# Patient Record
Sex: Male | Born: 1950 | Race: Black or African American | Hispanic: No | Marital: Married | Smoking: Never smoker
Health system: Southern US, Community
[De-identification: ages and names within clinical notes are randomized; demographics above are authoritative.]

## PROBLEM LIST (undated history)

## (undated) DIAGNOSIS — J449 Chronic obstructive pulmonary disease, unspecified: Secondary | ICD-10-CM

## (undated) DIAGNOSIS — I951 Orthostatic hypotension: Secondary | ICD-10-CM

## (undated) DIAGNOSIS — E785 Hyperlipidemia, unspecified: Secondary | ICD-10-CM

## (undated) DIAGNOSIS — R51 Headache: Secondary | ICD-10-CM

## (undated) DIAGNOSIS — J189 Pneumonia, unspecified organism: Secondary | ICD-10-CM

## (undated) DIAGNOSIS — R011 Cardiac murmur, unspecified: Secondary | ICD-10-CM

## (undated) DIAGNOSIS — I429 Cardiomyopathy, unspecified: Secondary | ICD-10-CM

## (undated) DIAGNOSIS — J069 Acute upper respiratory infection, unspecified: Secondary | ICD-10-CM

## (undated) DIAGNOSIS — Z9889 Other specified postprocedural states: Secondary | ICD-10-CM

## (undated) DIAGNOSIS — E669 Obesity, unspecified: Secondary | ICD-10-CM

## (undated) DIAGNOSIS — R569 Unspecified convulsions: Secondary | ICD-10-CM

## (undated) DIAGNOSIS — K759 Inflammatory liver disease, unspecified: Secondary | ICD-10-CM

## (undated) DIAGNOSIS — R0602 Shortness of breath: Secondary | ICD-10-CM

## (undated) DIAGNOSIS — G4730 Sleep apnea, unspecified: Secondary | ICD-10-CM

## (undated) DIAGNOSIS — Z981 Arthrodesis status: Secondary | ICD-10-CM

## (undated) DIAGNOSIS — I10 Essential (primary) hypertension: Secondary | ICD-10-CM

## (undated) DIAGNOSIS — K219 Gastro-esophageal reflux disease without esophagitis: Secondary | ICD-10-CM

## (undated) DIAGNOSIS — G459 Transient cerebral ischemic attack, unspecified: Secondary | ICD-10-CM

## (undated) DIAGNOSIS — IMO0002 Reserved for concepts with insufficient information to code with codable children: Secondary | ICD-10-CM

## (undated) HISTORY — DX: Essential (primary) hypertension: I10

## (undated) HISTORY — DX: Hyperlipidemia, unspecified: E78.5

## (undated) HISTORY — DX: Orthostatic hypotension: I95.1

## (undated) HISTORY — DX: Obesity, unspecified: E66.9

## (undated) HISTORY — DX: Reserved for concepts with insufficient information to code with codable children: IMO0002

## (undated) HISTORY — DX: Arthrodesis status: Z98.1

## (undated) HISTORY — DX: Other specified postprocedural states: Z98.890

---

## 1997-07-03 HISTORY — PX: LUMBAR DISC SURGERY: SHX700

## 1997-11-02 ENCOUNTER — Ambulatory Visit (HOSPITAL_COMMUNITY): Admission: RE | Admit: 1997-11-02 | Discharge: 1997-11-02 | Payer: Self-pay | Admitting: *Deleted

## 1997-11-03 ENCOUNTER — Ambulatory Visit (HOSPITAL_COMMUNITY): Admission: RE | Admit: 1997-11-03 | Discharge: 1997-11-03 | Payer: Self-pay | Admitting: *Deleted

## 1997-12-02 ENCOUNTER — Ambulatory Visit (HOSPITAL_BASED_OUTPATIENT_CLINIC_OR_DEPARTMENT_OTHER): Admission: RE | Admit: 1997-12-02 | Discharge: 1997-12-02 | Payer: Self-pay | Admitting: Orthopedic Surgery

## 1998-12-28 ENCOUNTER — Encounter: Payer: Self-pay | Admitting: Emergency Medicine

## 1998-12-28 ENCOUNTER — Emergency Department (HOSPITAL_COMMUNITY): Admission: EM | Admit: 1998-12-28 | Discharge: 1998-12-28 | Payer: Self-pay | Admitting: Emergency Medicine

## 1999-01-11 ENCOUNTER — Encounter: Payer: Self-pay | Admitting: Emergency Medicine

## 1999-01-11 ENCOUNTER — Emergency Department (HOSPITAL_COMMUNITY): Admission: EM | Admit: 1999-01-11 | Discharge: 1999-01-11 | Payer: Self-pay | Admitting: Emergency Medicine

## 1999-10-06 ENCOUNTER — Encounter: Payer: Self-pay | Admitting: Emergency Medicine

## 1999-10-06 ENCOUNTER — Emergency Department (HOSPITAL_COMMUNITY): Admission: EM | Admit: 1999-10-06 | Discharge: 1999-10-06 | Payer: Self-pay | Admitting: Emergency Medicine

## 1999-12-26 ENCOUNTER — Emergency Department (HOSPITAL_COMMUNITY): Admission: EM | Admit: 1999-12-26 | Discharge: 1999-12-26 | Payer: Self-pay | Admitting: Emergency Medicine

## 1999-12-26 ENCOUNTER — Encounter: Payer: Self-pay | Admitting: Emergency Medicine

## 2001-03-13 ENCOUNTER — Ambulatory Visit (HOSPITAL_BASED_OUTPATIENT_CLINIC_OR_DEPARTMENT_OTHER): Admission: RE | Admit: 2001-03-13 | Discharge: 2001-03-13 | Payer: Self-pay | Admitting: Orthopedic Surgery

## 2003-07-28 ENCOUNTER — Emergency Department (HOSPITAL_COMMUNITY): Admission: EM | Admit: 2003-07-28 | Discharge: 2003-07-28 | Payer: Self-pay | Admitting: *Deleted

## 2003-07-29 ENCOUNTER — Observation Stay (HOSPITAL_COMMUNITY): Admission: RE | Admit: 2003-07-29 | Discharge: 2003-07-30 | Payer: Self-pay | Admitting: General Surgery

## 2004-12-01 ENCOUNTER — Ambulatory Visit: Payer: Self-pay | Admitting: Family Medicine

## 2004-12-01 ENCOUNTER — Ambulatory Visit (HOSPITAL_COMMUNITY): Admission: RE | Admit: 2004-12-01 | Discharge: 2004-12-01 | Payer: Self-pay | Admitting: Family Medicine

## 2004-12-07 ENCOUNTER — Ambulatory Visit: Payer: Self-pay | Admitting: Family Medicine

## 2004-12-19 ENCOUNTER — Ambulatory Visit: Payer: Self-pay | Admitting: Family Medicine

## 2004-12-22 HISTORY — PX: OTHER SURGICAL HISTORY: SHX169

## 2005-01-10 ENCOUNTER — Ambulatory Visit (HOSPITAL_COMMUNITY): Admission: RE | Admit: 2005-01-10 | Discharge: 2005-01-10 | Payer: Self-pay | Admitting: Sports Medicine

## 2005-01-19 ENCOUNTER — Ambulatory Visit: Payer: Self-pay | Admitting: Family Medicine

## 2005-01-25 HISTORY — PX: OTHER SURGICAL HISTORY: SHX169

## 2005-02-24 ENCOUNTER — Ambulatory Visit: Payer: Self-pay | Admitting: Family Medicine

## 2005-02-24 ENCOUNTER — Ambulatory Visit (HOSPITAL_COMMUNITY): Admission: RE | Admit: 2005-02-24 | Discharge: 2005-02-24 | Payer: Self-pay | Admitting: Cardiovascular Disease

## 2005-02-27 ENCOUNTER — Ambulatory Visit (HOSPITAL_COMMUNITY): Admission: RE | Admit: 2005-02-27 | Discharge: 2005-02-27 | Payer: Self-pay | Admitting: Cardiovascular Disease

## 2005-04-02 HISTORY — PX: CARDIAC CATHETERIZATION: SHX172

## 2005-05-03 ENCOUNTER — Ambulatory Visit: Payer: Self-pay | Admitting: Family Medicine

## 2005-07-12 ENCOUNTER — Ambulatory Visit: Payer: Self-pay | Admitting: Family Medicine

## 2005-07-21 ENCOUNTER — Ambulatory Visit: Payer: Self-pay | Admitting: Family Medicine

## 2005-07-27 ENCOUNTER — Ambulatory Visit (HOSPITAL_BASED_OUTPATIENT_CLINIC_OR_DEPARTMENT_OTHER): Admission: RE | Admit: 2005-07-27 | Discharge: 2005-07-27 | Payer: Self-pay | Admitting: Family Medicine

## 2005-07-30 ENCOUNTER — Ambulatory Visit: Payer: Self-pay | Admitting: Internal Medicine

## 2005-08-14 HISTORY — PX: OTHER SURGICAL HISTORY: SHX169

## 2005-08-14 HISTORY — PX: CPAP: SHX449

## 2005-08-31 ENCOUNTER — Ambulatory Visit: Payer: Self-pay | Admitting: Family Medicine

## 2005-09-11 ENCOUNTER — Ambulatory Visit (HOSPITAL_COMMUNITY): Admission: RE | Admit: 2005-09-11 | Discharge: 2005-09-11 | Payer: Self-pay | Admitting: Internal Medicine

## 2005-09-15 HISTORY — PX: OTHER SURGICAL HISTORY: SHX169

## 2005-10-18 ENCOUNTER — Ambulatory Visit: Payer: Self-pay | Admitting: Family Medicine

## 2005-10-20 ENCOUNTER — Encounter: Admission: RE | Admit: 2005-10-20 | Discharge: 2005-10-20 | Payer: Self-pay | Admitting: Sports Medicine

## 2005-10-25 HISTORY — PX: MRI: SHX5353

## 2005-10-27 ENCOUNTER — Ambulatory Visit: Payer: Self-pay | Admitting: Family Medicine

## 2005-11-02 ENCOUNTER — Ambulatory Visit: Payer: Self-pay | Admitting: Family Medicine

## 2005-11-09 ENCOUNTER — Encounter: Admission: RE | Admit: 2005-11-09 | Discharge: 2005-11-09 | Payer: Self-pay | Admitting: Neurosurgery

## 2005-11-24 ENCOUNTER — Encounter: Admission: RE | Admit: 2005-11-24 | Discharge: 2005-11-24 | Payer: Self-pay | Admitting: Neurosurgery

## 2006-01-18 ENCOUNTER — Ambulatory Visit: Payer: Self-pay | Admitting: Family Medicine

## 2006-02-06 ENCOUNTER — Ambulatory Visit: Payer: Self-pay | Admitting: Family Medicine

## 2006-02-20 ENCOUNTER — Ambulatory Visit (HOSPITAL_COMMUNITY): Admission: RE | Admit: 2006-02-20 | Discharge: 2006-02-20 | Payer: Self-pay | Admitting: Family Medicine

## 2006-02-20 ENCOUNTER — Ambulatory Visit: Payer: Self-pay | Admitting: Sports Medicine

## 2006-03-06 ENCOUNTER — Ambulatory Visit: Payer: Self-pay | Admitting: Family Medicine

## 2006-03-07 ENCOUNTER — Ambulatory Visit: Payer: Self-pay | Admitting: Family Medicine

## 2006-03-09 ENCOUNTER — Encounter: Admission: RE | Admit: 2006-03-09 | Discharge: 2006-03-09 | Payer: Self-pay | Admitting: Sports Medicine

## 2006-03-15 ENCOUNTER — Ambulatory Visit: Payer: Self-pay | Admitting: Family Medicine

## 2006-03-22 ENCOUNTER — Ambulatory Visit: Payer: Self-pay | Admitting: Family Medicine

## 2006-03-23 ENCOUNTER — Inpatient Hospital Stay (HOSPITAL_COMMUNITY): Admission: RE | Admit: 2006-03-23 | Discharge: 2006-03-28 | Payer: Self-pay | Admitting: Neurosurgery

## 2006-03-23 ENCOUNTER — Ambulatory Visit: Payer: Self-pay | Admitting: Family Medicine

## 2006-03-23 ENCOUNTER — Ambulatory Visit: Payer: Self-pay | Admitting: Pulmonary Disease

## 2006-03-26 ENCOUNTER — Encounter: Payer: Self-pay | Admitting: Vascular Surgery

## 2006-05-16 ENCOUNTER — Ambulatory Visit: Payer: Self-pay | Admitting: Family Medicine

## 2006-08-17 ENCOUNTER — Ambulatory Visit: Payer: Self-pay | Admitting: Family Medicine

## 2006-08-30 DIAGNOSIS — R569 Unspecified convulsions: Secondary | ICD-10-CM | POA: Insufficient documentation

## 2006-08-30 DIAGNOSIS — E7800 Pure hypercholesterolemia, unspecified: Secondary | ICD-10-CM

## 2006-08-30 DIAGNOSIS — B1710 Acute hepatitis C without hepatic coma: Secondary | ICD-10-CM

## 2006-08-30 DIAGNOSIS — I10 Essential (primary) hypertension: Secondary | ICD-10-CM

## 2006-08-30 DIAGNOSIS — J4489 Other specified chronic obstructive pulmonary disease: Secondary | ICD-10-CM | POA: Insufficient documentation

## 2006-08-30 DIAGNOSIS — K219 Gastro-esophageal reflux disease without esophagitis: Secondary | ICD-10-CM | POA: Insufficient documentation

## 2006-08-30 DIAGNOSIS — G4730 Sleep apnea, unspecified: Secondary | ICD-10-CM | POA: Insufficient documentation

## 2006-08-30 DIAGNOSIS — J449 Chronic obstructive pulmonary disease, unspecified: Secondary | ICD-10-CM

## 2006-09-18 ENCOUNTER — Ambulatory Visit: Payer: Self-pay | Admitting: Family Medicine

## 2006-11-30 ENCOUNTER — Telehealth: Payer: Self-pay | Admitting: *Deleted

## 2006-12-03 ENCOUNTER — Encounter: Payer: Self-pay | Admitting: Family Medicine

## 2006-12-04 ENCOUNTER — Encounter (INDEPENDENT_AMBULATORY_CARE_PROVIDER_SITE_OTHER): Payer: Self-pay | Admitting: Family Medicine

## 2006-12-04 ENCOUNTER — Ambulatory Visit: Payer: Self-pay | Admitting: *Deleted

## 2006-12-04 DIAGNOSIS — F528 Other sexual dysfunction not due to a substance or known physiological condition: Secondary | ICD-10-CM | POA: Insufficient documentation

## 2006-12-04 LAB — CONVERTED CEMR LAB
AST: 24 units/L (ref 0–37)
Albumin: 4.9 g/dL (ref 3.5–5.2)
BUN: 14 mg/dL (ref 6–23)
CO2: 21 meq/L (ref 19–32)
Calcium: 10 mg/dL (ref 8.4–10.5)
Chloride: 102 meq/L (ref 96–112)
Creatinine, Ser: 1.31 mg/dL (ref 0.40–1.50)
Direct LDL: 170 mg/dL — ABNORMAL HIGH
Glucose, Bld: 84 mg/dL (ref 70–99)
LDL Goal: 130 mg/dL
Potassium: 4.4 meq/L (ref 3.5–5.3)
TSH: 2.653 microintl units/mL (ref 0.350–5.50)

## 2007-01-10 ENCOUNTER — Telehealth (INDEPENDENT_AMBULATORY_CARE_PROVIDER_SITE_OTHER): Payer: Self-pay | Admitting: Family Medicine

## 2007-02-15 ENCOUNTER — Ambulatory Visit: Payer: Self-pay | Admitting: Family Medicine

## 2007-02-15 ENCOUNTER — Encounter: Payer: Self-pay | Admitting: *Deleted

## 2007-03-15 ENCOUNTER — Encounter: Payer: Self-pay | Admitting: *Deleted

## 2007-03-22 ENCOUNTER — Ambulatory Visit: Payer: Self-pay | Admitting: Family Medicine

## 2007-03-22 ENCOUNTER — Encounter (INDEPENDENT_AMBULATORY_CARE_PROVIDER_SITE_OTHER): Payer: Self-pay | Admitting: Family Medicine

## 2007-03-22 LAB — CONVERTED CEMR LAB
AST: 20 units/L (ref 0–37)
Alkaline Phosphatase: 102 units/L (ref 39–117)
BUN: 14 mg/dL (ref 6–23)
Calcium: 9.4 mg/dL (ref 8.4–10.5)
Creatinine, Ser: 1.34 mg/dL (ref 0.40–1.50)
MCHC: 34.8 g/dL (ref 30.0–36.0)
RBC: 5.75 M/uL (ref 4.22–5.81)
RDW: 16 % — ABNORMAL HIGH (ref 11.5–14.0)
WBC: 10.9 10*3/uL — ABNORMAL HIGH (ref 4.0–10.5)

## 2007-04-01 ENCOUNTER — Ambulatory Visit: Payer: Self-pay | Admitting: Sports Medicine

## 2007-04-01 ENCOUNTER — Encounter: Admission: RE | Admit: 2007-04-01 | Discharge: 2007-04-01 | Payer: Self-pay | Admitting: Family Medicine

## 2007-04-02 ENCOUNTER — Ambulatory Visit: Payer: Self-pay | Admitting: Family Medicine

## 2007-04-02 ENCOUNTER — Encounter (INDEPENDENT_AMBULATORY_CARE_PROVIDER_SITE_OTHER): Payer: Self-pay | Admitting: Family Medicine

## 2007-04-02 LAB — CONVERTED CEMR LAB
BUN: 17 mg/dL (ref 6–23)
Chloride: 102 meq/L (ref 96–112)
Glucose, Bld: 86 mg/dL (ref 70–99)
Potassium: 4.8 meq/L (ref 3.5–5.3)

## 2007-04-04 ENCOUNTER — Encounter (INDEPENDENT_AMBULATORY_CARE_PROVIDER_SITE_OTHER): Payer: Self-pay | Admitting: Family Medicine

## 2007-06-12 ENCOUNTER — Encounter: Payer: Self-pay | Admitting: *Deleted

## 2007-07-19 ENCOUNTER — Ambulatory Visit: Payer: Self-pay | Admitting: Family Medicine

## 2007-07-23 ENCOUNTER — Encounter (INDEPENDENT_AMBULATORY_CARE_PROVIDER_SITE_OTHER): Payer: Self-pay | Admitting: Family Medicine

## 2007-07-25 ENCOUNTER — Encounter (INDEPENDENT_AMBULATORY_CARE_PROVIDER_SITE_OTHER): Payer: Self-pay | Admitting: Family Medicine

## 2007-07-25 ENCOUNTER — Ambulatory Visit: Payer: Self-pay | Admitting: Family Medicine

## 2007-07-26 LAB — CONVERTED CEMR LAB
Albumin: 4.4 g/dL (ref 3.5–5.2)
CO2: 25 meq/L (ref 19–32)
Calcium: 9.3 mg/dL (ref 8.4–10.5)
Cholesterol: 173 mg/dL (ref 0–200)
Glucose, Bld: 105 mg/dL — ABNORMAL HIGH (ref 70–99)
Sodium: 139 meq/L (ref 135–145)
Total Bilirubin: 0.4 mg/dL (ref 0.3–1.2)
Total Protein: 7.7 g/dL (ref 6.0–8.3)
Triglycerides: 200 mg/dL — ABNORMAL HIGH (ref <150)
VLDL: 40 mg/dL (ref 0–40)

## 2007-07-31 ENCOUNTER — Ambulatory Visit: Payer: Self-pay | Admitting: Family Medicine

## 2007-08-19 ENCOUNTER — Ambulatory Visit: Payer: Self-pay | Admitting: Family Medicine

## 2007-10-03 ENCOUNTER — Ambulatory Visit: Payer: Self-pay | Admitting: Family Medicine

## 2007-11-04 ENCOUNTER — Encounter: Admission: RE | Admit: 2007-11-04 | Discharge: 2007-12-24 | Payer: Self-pay | Admitting: Family Medicine

## 2007-11-06 ENCOUNTER — Telehealth (INDEPENDENT_AMBULATORY_CARE_PROVIDER_SITE_OTHER): Payer: Self-pay | Admitting: Family Medicine

## 2007-11-12 ENCOUNTER — Ambulatory Visit: Payer: Self-pay | Admitting: Family Medicine

## 2007-11-13 ENCOUNTER — Telehealth (INDEPENDENT_AMBULATORY_CARE_PROVIDER_SITE_OTHER): Payer: Self-pay | Admitting: *Deleted

## 2007-11-14 ENCOUNTER — Ambulatory Visit: Payer: Self-pay | Admitting: Cardiology

## 2007-11-14 ENCOUNTER — Inpatient Hospital Stay (HOSPITAL_COMMUNITY): Admission: EM | Admit: 2007-11-14 | Discharge: 2007-11-16 | Payer: Self-pay | Admitting: Emergency Medicine

## 2007-11-15 ENCOUNTER — Encounter: Payer: Self-pay | Admitting: Cardiology

## 2007-11-29 ENCOUNTER — Encounter: Payer: Self-pay | Admitting: *Deleted

## 2007-12-13 ENCOUNTER — Encounter (INDEPENDENT_AMBULATORY_CARE_PROVIDER_SITE_OTHER): Payer: Self-pay | Admitting: Family Medicine

## 2007-12-13 ENCOUNTER — Ambulatory Visit: Payer: Self-pay | Admitting: Family Medicine

## 2007-12-16 ENCOUNTER — Encounter (INDEPENDENT_AMBULATORY_CARE_PROVIDER_SITE_OTHER): Payer: Self-pay | Admitting: Family Medicine

## 2007-12-24 ENCOUNTER — Encounter: Payer: Self-pay | Admitting: Family Medicine

## 2008-02-04 ENCOUNTER — Emergency Department (HOSPITAL_COMMUNITY): Admission: EM | Admit: 2008-02-04 | Discharge: 2008-02-05 | Payer: Self-pay | Admitting: Pediatrics

## 2008-02-13 ENCOUNTER — Ambulatory Visit: Payer: Self-pay | Admitting: Family Medicine

## 2008-02-25 ENCOUNTER — Encounter: Payer: Self-pay | Admitting: Family Medicine

## 2008-02-25 ENCOUNTER — Ambulatory Visit: Payer: Self-pay | Admitting: Family Medicine

## 2008-03-02 ENCOUNTER — Encounter: Payer: Self-pay | Admitting: Family Medicine

## 2008-03-25 ENCOUNTER — Telehealth: Payer: Self-pay | Admitting: *Deleted

## 2008-03-26 ENCOUNTER — Encounter: Payer: Self-pay | Admitting: Family Medicine

## 2008-03-31 ENCOUNTER — Encounter: Admission: RE | Admit: 2008-03-31 | Discharge: 2008-03-31 | Payer: Self-pay | Admitting: Neurosurgery

## 2008-04-03 ENCOUNTER — Inpatient Hospital Stay (HOSPITAL_COMMUNITY): Admission: RE | Admit: 2008-04-03 | Discharge: 2008-04-05 | Payer: Self-pay | Admitting: Neurosurgery

## 2008-05-05 ENCOUNTER — Encounter: Payer: Self-pay | Admitting: Family Medicine

## 2008-05-08 ENCOUNTER — Encounter: Payer: Self-pay | Admitting: *Deleted

## 2008-05-13 ENCOUNTER — Encounter (INDEPENDENT_AMBULATORY_CARE_PROVIDER_SITE_OTHER): Payer: Self-pay | Admitting: *Deleted

## 2008-06-10 ENCOUNTER — Ambulatory Visit: Payer: Self-pay | Admitting: Family Medicine

## 2008-06-11 ENCOUNTER — Encounter: Payer: Self-pay | Admitting: Family Medicine

## 2008-06-12 ENCOUNTER — Encounter: Admission: RE | Admit: 2008-06-12 | Discharge: 2008-06-12 | Payer: Self-pay | Admitting: Neurosurgery

## 2008-06-15 ENCOUNTER — Ambulatory Visit: Payer: Self-pay | Admitting: Family Medicine

## 2008-06-15 ENCOUNTER — Encounter: Payer: Self-pay | Admitting: Family Medicine

## 2008-07-08 ENCOUNTER — Inpatient Hospital Stay (HOSPITAL_COMMUNITY): Admission: RE | Admit: 2008-07-08 | Discharge: 2008-07-13 | Payer: Self-pay | Admitting: Neurosurgery

## 2008-12-11 ENCOUNTER — Encounter: Payer: Self-pay | Admitting: Family Medicine

## 2008-12-11 ENCOUNTER — Ambulatory Visit: Payer: Self-pay | Admitting: Family Medicine

## 2008-12-11 LAB — CONVERTED CEMR LAB
Albumin: 4.1 g/dL (ref 3.5–5.2)
CO2: 23 meq/L (ref 19–32)
Calcium: 9.5 mg/dL (ref 8.4–10.5)
Chloride: 105 meq/L (ref 96–112)
Cholesterol: 145 mg/dL (ref 0–200)
Glucose, Bld: 97 mg/dL (ref 70–99)
Potassium: 4.8 meq/L (ref 3.5–5.3)
Sodium: 140 meq/L (ref 135–145)
Total Protein: 7.2 g/dL (ref 6.0–8.3)
Triglycerides: 109 mg/dL (ref <150)

## 2008-12-14 ENCOUNTER — Encounter: Payer: Self-pay | Admitting: Family Medicine

## 2009-01-18 ENCOUNTER — Ambulatory Visit: Payer: Self-pay | Admitting: Gastroenterology

## 2009-01-25 ENCOUNTER — Ambulatory Visit: Payer: Self-pay | Admitting: Family Medicine

## 2009-01-25 ENCOUNTER — Ambulatory Visit (HOSPITAL_COMMUNITY): Admission: RE | Admit: 2009-01-25 | Discharge: 2009-01-25 | Payer: Self-pay | Admitting: Family Medicine

## 2009-01-25 ENCOUNTER — Encounter: Payer: Self-pay | Admitting: Family Medicine

## 2009-01-26 ENCOUNTER — Encounter: Payer: Self-pay | Admitting: Family Medicine

## 2009-01-26 DIAGNOSIS — N183 Chronic kidney disease, stage 3 (moderate): Secondary | ICD-10-CM

## 2009-01-26 LAB — CONVERTED CEMR LAB
BUN: 14 mg/dL (ref 6–23)
Chloride: 107 meq/L (ref 96–112)
Glucose, Bld: 88 mg/dL (ref 70–99)
Potassium: 4.4 meq/L (ref 3.5–5.3)
Sodium: 141 meq/L (ref 135–145)

## 2009-01-29 ENCOUNTER — Encounter: Payer: Self-pay | Admitting: Family Medicine

## 2009-01-29 ENCOUNTER — Ambulatory Visit: Payer: Self-pay | Admitting: Family Medicine

## 2009-02-01 ENCOUNTER — Encounter: Payer: Self-pay | Admitting: Gastroenterology

## 2009-02-01 ENCOUNTER — Ambulatory Visit: Payer: Self-pay | Admitting: Gastroenterology

## 2009-02-02 ENCOUNTER — Encounter: Payer: Self-pay | Admitting: Gastroenterology

## 2009-03-23 ENCOUNTER — Ambulatory Visit: Payer: Self-pay | Admitting: Family Medicine

## 2009-05-14 ENCOUNTER — Telehealth: Payer: Self-pay | Admitting: Family Medicine

## 2009-08-03 ENCOUNTER — Encounter (INDEPENDENT_AMBULATORY_CARE_PROVIDER_SITE_OTHER): Payer: Self-pay | Admitting: *Deleted

## 2009-08-13 ENCOUNTER — Ambulatory Visit: Payer: Self-pay | Admitting: Family Medicine

## 2009-08-13 ENCOUNTER — Encounter: Payer: Self-pay | Admitting: Family Medicine

## 2009-08-16 ENCOUNTER — Encounter: Payer: Self-pay | Admitting: Family Medicine

## 2009-08-20 ENCOUNTER — Ambulatory Visit: Payer: Self-pay | Admitting: Family Medicine

## 2009-08-23 ENCOUNTER — Encounter: Payer: Self-pay | Admitting: Family Medicine

## 2009-10-06 ENCOUNTER — Telehealth: Payer: Self-pay | Admitting: Family Medicine

## 2009-10-25 ENCOUNTER — Telehealth: Payer: Self-pay | Admitting: Family Medicine

## 2009-10-29 ENCOUNTER — Ambulatory Visit: Payer: Self-pay | Admitting: Family Medicine

## 2009-11-02 ENCOUNTER — Telehealth: Payer: Self-pay | Admitting: Family Medicine

## 2010-04-22 ENCOUNTER — Encounter: Payer: Self-pay | Admitting: Family Medicine

## 2010-05-06 ENCOUNTER — Encounter: Payer: Self-pay | Admitting: Family Medicine

## 2010-07-24 ENCOUNTER — Encounter: Payer: Self-pay | Admitting: Cardiovascular Disease

## 2010-07-24 ENCOUNTER — Encounter: Payer: Self-pay | Admitting: Neurosurgery

## 2010-08-02 NOTE — Assessment & Plan Note (Signed)
Summary: erectile dysfunction   Vital Signs:  Patient profile:   60 year old male Height:      69 inches Weight:      271 pounds BMI:     40.16 BSA:     2.35 Temp:     98.4 degrees F Pulse rate:   79 / minute BP sitting:   157 / 100  Vitals Entered By: Jone Baseman CMA (August 20, 2009 8:34 AM) CC: erectile dysfunction Is Patient Diabetic? No Pain Assessment Patient in pain? yes     Location: back Intensity: 8   CC:  erectile dysfunction.  History of Present Illness: 60 yo male presenting for erectile dysfunction.  Has not been able to have an erection for >1 year.  Has tried and failed on Viagra, Levitra, Cialis.  PMH significant for HTN, HLD, CRI.  Would like to consider other methods of achieving erection.  Testosterone level was checked this week and is 476, which is normal.  Discussed 3 options with patient:  intracernous injection of prostaglandin, urethral insertion of prostaglandin (Muse), and penile pump.  He does not favor the idea of penil injections, but the other two are okay with him.  He is concerned about cost.  Habits & Providers  Alcohol-Tobacco-Diet     Tobacco Status: never  Current Medications (verified): 1)  Allopurinol 300 Mg Tabs (Allopurinol) .Marland Kitchen.. 1 Tablet By Mouth Once A Day 2)  Aspirin Childrens 81 Mg Chew (Aspirin) .... Take 1 Tablet By Mouth Once A Day 3)  Crestor 40 Mg  Tabs (Rosuvastatin Calcium) .... One Tablet Daily For Cholesterol Control 4)  Lovaza 1 Gm Caps (Omega-3-Acid Ethyl Esters) .... Take 4 Capsules By Mouth Once A Day With Breakfast 5)  Proventil Hfa 108 (90 Base) Mcg/act Aers (Albuterol Sulfate) .... Inhale 1-2 Puff Every Four Hours 6)  Prilosec Otc 20 Mg  Tbec (Omeprazole Magnesium) .Marland Kitchen.. 1 Tablet Daily 7)  Coreg Cr 80 Mg  Cp24 (Carvedilol Phosphate) .... One Tablet Daily For Blood Pressure Control 8)  Keppra Xr 500 Mg  Tb24 (Levetiracetam) .... Four Tablets Once A Day For Seizure Control 9)  Lidoderm 5 %  Ptch (Lidocaine)  .... Apply Patch To Shoulder Up To 12 Hours A Day 10)  Gabapentin 300 Mg Caps (Gabapentin) .Marland Kitchen.. 1 Tab By Mouth Three Times A Day For Seizures 11)  Lisinopril 20 Mg Tabs (Lisinopril) .Marland Kitchen.. 1 Tab By Mouth Daily For High Blood Pressure  Allergies (verified): 1)  ! Codeine  Physical Exam  General:  Alert and oriented x3, obese, no acute distress   Impression & Recommendations:  Problem # 1:  ERECTILE DYSFUNCTION, MILD (ICD-302.72)  Failed oral meds.  Considering pump vs. Muse.  Have asked Dr. Karie Schwalbe to give me information about pumps as he has apparently been successful in setting this up for patients in the past.  Will call patient when I have informatin.  Orders: [facility]- Est Level  3 ([address])  Problem # 2:  HYPERTENSION, BENIGN SYSTEMIC (ICD-401.1) Uncontrolled today, but not clear what he is actually taking.  Advised to bring medicines to next visit in 1 month. The following medications were removed from the medication list:    Lisinopril 20 Mg Tabs (Lisinopril) .Marland Kitchen... 1 tab by mouth daily for high blood pressure His updated medication list for this problem includes:    Coreg Cr 80 Mg Cp24 (Carvedilol phosphate) ..... One tablet daily for blood pressure control  Complete Medication List: 1)  Allopurinol 300 Mg Tabs (Allopurinol) .Marland KitchenMarland KitchenMarland Kitchen  1 tablet by mouth once a day 2)  Aspirin Childrens 81 Mg Chew (Aspirin) .... Take 1 tablet by mouth once a day 3)  Crestor 40 Mg Tabs (Rosuvastatin calcium) .... One tablet daily for cholesterol control 4)  Lovaza 1 Gm Caps (Omega-3-acid ethyl esters) .... Take 4 capsules by mouth once a day with breakfast 5)  Proventil Hfa 108 (90 Base) Mcg/act Aers (Albuterol sulfate) .... Inhale 1-2 puff every four hours 6)  Prilosec Otc 20 Mg Tbec (Omeprazole magnesium) .Marland Kitchen.. 1 tablet daily 7)  Coreg Cr 80 Mg Cp24 (Carvedilol phosphate) .... One tablet daily for blood pressure control 8)  Keppra Xr 500 Mg Tb24 (Levetiracetam) .... Four tablets once a day for seizure  control 9)  Lidoderm 5 % Ptch (Lidocaine) .... Apply patch to shoulder up to 12 hours a day 10)  Gabapentin 300 Mg Caps (Gabapentin) .Marland Kitchen.. 1 tab by mouth three times a day for seizures  Patient Instructions: 1)  I have been unable to find information about a good penis pump, but there is a doctor in the clinic who has helped patients get these in the past.  I have asked him for company recommendations and will call when I hear back from him. 2)  Another option is a medicine called Muse.  This is the medicine I mentioned that is placed with a few drops inside the end of the penis.  While I'm getting information about pumps, please look this one up on the internet and see if it looks like an option for you. 3)  Also, your blood pressure is high again, so I have started a medicine call Lisinopril. 4)  Please schedule a follow-up appointment in 1 month for blood pressure and lab check.  Prescriptions: LISINOPRIL 20 MG TABS (LISINOPRIL) 1 tab by mouth daily for high blood pressure  #30 x 1   Entered and Authorized by:   Romero Belling MD   Signed by:   Romero Belling MD on 08/20/2009   Method used:   Electronically to        [facility] [address] [phone]* (retail)       [address]       [place], [place]  [address]       Ph: [phone] or [phone]       Fax: [phone]   RxID:   [id number]   Appended Document: erectile dysfunction    Clinical Lists Changes  Problems: Assessed ERECTILE DYSFUNCTION, MILD as comment only - Discussed with Dr. Karie Schwalbe, who states he has had luck with giving patient written prescription for penis pump to bring to pharmacy.  Will provide for patient. Medications: Added new medication of * PENIS PUMP Use as directed to obtain erection prior to sexual activity. - Signed Rx of PENIS PUMP Use as directed to obtain erection prior to sexual activity.;  #1 x 0;  Signed;  Entered by: Romero Belling MD;  Authorized by: Romero Belling MD;  Method used:  Print then Give to Patient    Prescriptions: PENIS PUMP Use as directed to obtain erection prior to sexual activity.  #1 x 0   Entered and Authorized by:   Romero Belling MD   Signed by:   Romero Belling MD on 08/23/2009   Method used:   Print then Give to Patient   RxID:   [id number]      Impression & Recommendations:  Problem # 1:  ERECTILE DYSFUNCTION, MILD (  ICD-302.72) Assessment Comment Only Discussed with Dr. Karie Schwalbe, who states he has had luck with giving patient written prescription for penis pump to bring to pharmacy.  Will provide for patient.  Complete Medication List: 1)  Allopurinol 300 Mg Tabs (Allopurinol) .Marland Kitchen.. 1 tablet by mouth once a day 2)  Aspirin Childrens 81 Mg Chew (Aspirin) .... Take 1 tablet by mouth once a day 3)  Crestor 40 Mg Tabs (Rosuvastatin calcium) .... One tablet daily for cholesterol control 4)  Lovaza 1 Gm Caps (Omega-3-acid ethyl esters) .... Take 4 capsules by mouth once a day with breakfast 5)  Proventil Hfa 108 (90 Base) Mcg/act Aers (Albuterol sulfate) .... Inhale 1-2 puff every four hours 6)  Prilosec Otc 20 Mg Tbec (Omeprazole magnesium) .Marland Kitchen.. 1 tablet daily 7)  Coreg Cr 80 Mg Cp24 (Carvedilol phosphate) .... One tablet daily for blood pressure control 8)  Keppra Xr 500 Mg Tb24 (Levetiracetam) .... Four tablets once a day for seizure control 9)  Lidoderm 5 % Ptch (Lidocaine) .... Apply patch to shoulder up to 12 hours a day 10)  Gabapentin 300 Mg Caps (Gabapentin) .Marland Kitchen.. 1 tab by mouth three times a day for seizures 11)  Penis Pump  .... Use as directed to obtain erection prior to sexual activity.

## 2010-08-02 NOTE — Progress Notes (Signed)
Summary: Rx Req  Phone Note Call from Patient Call back at Home Phone [phone]   Caller: Patient Summary of Call: Pt wants rx for an inhaler.  Pharmacy is [facility]. Initial call taken by: Clydell Hakim,  October 25, 2009 9:23 AM  Follow-up for Phone Call         to PCP. Follow-up by: Theresia Lo RN,  October 25, 2009 9:28 AM  Additional Follow-up for Phone Call Additional follow up Details #1::        No inhaler prescribed since 2009.  No COPD evaluation recently.  Needs appointment. Additional Follow-up by: Romero Belling MD,  October 25, 2009 6:42 PM    Additional Follow-up for Phone Call Additional follow up Details #2::    Left message to call office for message from MD Follow-up by: Starleen Blue RN,  October 26, 2009 10:07 AM

## 2010-08-02 NOTE — Letter (Signed)
Summary: Testosterone, PSA -- wnl  [facility]  [address]   [place], [place] [address]   Phone: [phone]  Fax: [phone]    08/16/2009  [name] [address] [place], [place]  [address]  Dear [name],  Your testosterone level was completely normal.  Also, your PSA was in the normal range at 0.32.  I understand you were mostly concerned that you might have low testosterone contributing to your erectile dysfunction.  At this point, your testosterone is normal, so we need to discuss options other than testosterone replacement, understanding that oral medications have not worked well for you in the past.  Please schedule a visit with me at your convenience to discuss this.  It was good to see you and [name] looking so well at your last visit, I look forward to seeing you soon.  Sincerely, Romero Belling MD  Appended Document: Testosterone, PSA -- wnl mailed.

## 2010-08-02 NOTE — Letter (Signed)
Summary: Appointment - Missed  [facility], Main Office  [address]   [place], [place] [address]   Phone: [phone]  Fax: [phone]     August 03, 2009 MRN: [id number]   [name] [address] [place], [place]  [address]   Dear [name],  Our records indicate you missed your appointment on 08/02/2009 with Dr. Shirlee Latch.  It is very important that we reach you to reschedule this appointment. We look forward to participating in your health care needs. Please contact us at the number listed above at your earliest convenience to reschedule this appointment.     Sincerely,   Migdalia Dk [facility] Scheduling Team

## 2010-08-02 NOTE — Progress Notes (Signed)
Summary: RX  Phone Note Refill Request Call back at Home Phone [phone] Call back at [phone]   pt requesting rx for something personal, wants MD to call  Initial call taken by: Knox Royalty,  October 06, 2009 9:23 AM  Follow-up for Phone Call        Returned call.  Wants Rx called in for penis pump to [facility] on [address]--this address is actually [employer].  Will FAX prescription. Follow-up by: Romero Belling MD,  October 06, 2009 10:31 AM    Prescriptions: PENIS PUMP Use as directed to obtain erection prior to sexual activity.  #1 x 0   Entered and Authorized by:   Romero Belling MD   Signed by:   Romero Belling MD on 10/06/2009   Method used:   Printed then faxed to ...       [facility] [address] [phone]* (retail)       [address]       [place], [place]  [address]       Ph: [phone] or [phone]       Fax: [phone]   RxID:   [id number]

## 2010-08-02 NOTE — Miscellaneous (Signed)
Summary: Check Testosterone Level  Clinical Lists Changes  Problems: Assessed ERECTILE DYSFUNCTION, MILD as comment only - Here with wife today.  Asking to have testosterone replacement.  Concern is for ED, which has been refractory to Cialis, Levitra, Viagra.  Has been a problem for >1 year.  - Will check testosterone level today.  If abnormal, consider replacement.  If normal, consider other ED treatments:  Muse, Penile Injection, Penile Vacuum Pump.  - Will also check PSA today in case we will be replacing testosterone in the future.  Orders: PSA (Medicare)-[facility] [phone]) Testosterone-[facility] [phone])  Orders: Added new Test order of PSA (Medicare)-[facility] (G0103) - Signed Added new Test order of Testosterone-[facility] [phone]) - Signed       Impression & Recommendations:  Problem # 1:  ERECTILE DYSFUNCTION, MILD (ICD-302.72) Assessment Comment Only Here with wife today.  Asking to have testosterone replacement.  Concern is for ED, which has been refractory to Cialis, Levitra, Viagra.  Has been a problem for >1 year.  - Will check testosterone level today.  If abnormal, consider replacement.  If normal, consider other ED treatments:  Muse, Penile Injection, Penile Vacuum Pump.  - Will also check PSA today in case we will be replacing testosterone in the future.  Orders: PSA (Medicare)-[facility] ([id number]) Testosterone-[facility] [phone])  Complete Medication List: 1)  Allopurinol 300 Mg Tabs (Allopurinol) .Marland Kitchen.. 1 tablet by mouth once a day 2)  Aspirin Childrens 81 Mg Chew (Aspirin) .... Take 1 tablet by mouth once a day 3)  Crestor 40 Mg Tabs (Rosuvastatin calcium) .... One tablet daily for cholesterol control 4)  Lovaza 1 Gm Caps (Omega-3-acid ethyl esters) .... Take 4 capsules by mouth once a day with breakfast 5)  Proventil Hfa 108 (90 Base) Mcg/act Aers (Albuterol sulfate) .... Inhale 1-2 puff every four hours 6)  Prilosec Otc 20 Mg Tbec (Omeprazole magnesium) .Marland Kitchen.. 1 tablet daily 7)   Coreg Cr 80 Mg Cp24 (Carvedilol phosphate) .... One tablet daily for blood pressure control 8)  Keppra Xr 500 Mg Tb24 (Levetiracetam) .... Four tablets once a day for seizure control 9)  Lidoderm 5 % Ptch (Lidocaine) .... Apply patch to shoulder up to 12 hours a day 10)  Nizoral 2 % Sham (Ketoconazole) .... Wash body twice a week for prevention of tinea versicolor 11)  Levitra 10 Mg Tabs (Vardenafil hcl) .Marland Kitchen.. 1 tab by mouth 30-60 minutes prior to intercourse 12)  Gabapentin 300 Mg Caps (Gabapentin) .Marland Kitchen.. 1 tab by mouth three times a day for seizures 13)  Cialis 10 Mg Tabs (Tadalafil) .Marland Kitchen.. 1 tab by mouth 30 minutes prior to sexual activity

## 2010-08-02 NOTE — Miscellaneous (Signed)
  Clinical Lists Changes  Problems: Removed problem of COUGH (ICD-786.2) Removed problem of BACK PAIN, LOW (ICD-724.2)

## 2010-08-02 NOTE — Miscellaneous (Signed)
  Clinical Lists Changes  Problems: Changed problem from RENAL INSUFFICIENCY, CHRONIC (ICD-585.9) to CHRONIC KIDNEY DISEASE STAGE III (MODERATE) (ICD-585.3) 

## 2010-08-02 NOTE — Assessment & Plan Note (Signed)
Summary: cough   Vital Signs:  Patient profile:   60 year old male Height:      69 inches Weight:      271 pounds BMI:     40.16 BSA:     2.35 Temp:     98.5 degrees F Pulse rate:   91 / minute BP sitting:   139 / 89  Vitals Entered By: Jone Baseman CMA (October 29, 2009 4:11 PM) CC: cough Is Patient Diabetic? No Pain Assessment Patient in pain? yes     Location: back Intensity: 8   Primary Care Provider:  Romero Belling MD  CC:  cough.  History of Present Illness: 2 weeks of cough, wheeze, rhinorrhea, dyspnea.  No chest pain, fever.  Robutussin not helping.  No formal history of asthma, but used to use an inhaler as needed.  Habits & Providers  Alcohol-Tobacco-Diet     Tobacco Status: never  Allergies (verified): 1)  ! Codeine  Review of Systems       Per HPI.  Physical Exam  Additional Exam:  VITALS:  Reviewed, normal GEN: Alert & oriented, no acute distress NECK: Midline trachea, no masses/thyromegaly, no cervical lymphadenopathy CARDIO: Regular rate and rhythm, no murmurs/rubs/gallops, 2+ bilateral radial pulses RESP: Bilateral wheezing, frequent coughing, no increased work of breathing EARS:  External ear without significant lesions or deformities.  Clear canals, TM intact bilaterally without bulging, retraction, inflammation or discharge. Hearing grossly normal bilaterally. NOSE:  Nasal mucosa are pink and moist without lesions or exudates. MOUTH:  Oral mucosa and oropharynx without lesions or exudates.    Impression & Recommendations:  Problem # 1:  COUGH (ICD-786.2) Assessment New Question cough-variant asthma.  Will treat now with short prednisone burst, Albuterol inhaler as needed.  Patient declines nebulizer treatment in clinic today.  Given teaching on inhaler use.  - Ventolin 1-2 puffs inhaled q4h as needed  - Prednisone 50 mg by mouth daily x5 days Orders: [facility]- Est Level  3 ([address])  Complete Medication List: 1)  Allopurinol 300 Mg  Tabs (Allopurinol) .Marland Kitchen.. 1 tablet by mouth once a day 2)  Aspirin Childrens 81 Mg Chew (Aspirin) .... Take 1 tablet by mouth once a day 3)  Crestor 40 Mg Tabs (Rosuvastatin calcium) .... One tablet daily for cholesterol control 4)  Lovaza 1 Gm Caps (Omega-3-acid ethyl esters) .... Take 4 capsules by mouth once a day with breakfast 5)  Proventil Hfa 108 (90 Base) Mcg/act Aers (Albuterol sulfate) .... Inhale 1-2 puff every four hours 6)  Prilosec Otc 20 Mg Tbec (Omeprazole magnesium) .Marland Kitchen.. 1 tablet daily 7)  Coreg Cr 80 Mg Cp24 (Carvedilol phosphate) .... One tablet daily for blood pressure control 8)  Keppra Xr 500 Mg Tb24 (Levetiracetam) .... Four tablets once a day for seizure control 9)  Lidoderm 5 % Ptch (Lidocaine) .... Apply patch to shoulder up to 12 hours a day 10)  Gabapentin 300 Mg Caps (Gabapentin) .Marland Kitchen.. 1 tab by mouth three times a day for seizures 11)  Penis Pump  .... Use as directed to obtain erection prior to sexual activity. 12)  Prednisone 50 Mg Tabs (Prednisone) .Marland Kitchen.. 1 tab by mouth daily x5 days 13)  Ventolin Hfa 108 (90 Base) Mcg/act Aers (Albuterol sulfate) .Marland Kitchen.. 1-2 puffs inhaled q4h as needed cough, wheeze  Patient Instructions: 1)  Take prednisone daily for 5 days. 2)  Use albuterol as needed:  1-2 puffs every 4 hours. 3)  Follow up in 1 week if not better. Prescriptions: VENTOLIN  HFA 108 (90 BASE) MCG/ACT AERS (ALBUTEROL SULFATE) 1-2 puffs inhaled q4h as needed cough, wheeze  #1 x 3   Entered and Authorized by:   Romero Belling MD   Signed by:   Romero Belling MD on 10/29/2009   Method used:   Electronically to        [facility] [address] [phone]* (retail)       [address]       [address]       [place], [place]  [address]       Ph: [phone]       Fax: [phone]   RxID:   [id number] PREDNISONE 50 MG TABS (PREDNISONE) 1 tab by mouth daily x5 days  #5 x 0   Entered and Authorized by:   Romero Belling MD   Signed by:   Romero Belling MD on 10/29/2009    Method used:   Electronically to        [facility] [address] [phone]* (retail)       [address]       [address]       [place], [place]  [address]       Ph: [phone]       Fax: [phone]   RxID:   [id number]

## 2010-08-02 NOTE — Letter (Signed)
Summary: Rx for Penis Pump  [facility]  [address]   [place], [place] [address]   Phone: [phone]  Fax: [phone]    08/23/2009  [name] [address] [place], [place]  [address]  Dear [name],  After our discussion last week, I have spoken with some other doctors in my clinic who have helped patients obtain a penis pump for erectile dysfunction.  On their advise, I am sending you a prescription for a penis pump, which you should bring to the pharmacy of your choice.  Many of them have manufacturers they work with and can recommend.  If they require paperwork to be filled out by me, they will FAX it to me and I will be happy to complete that.   Sincerely,   Romero Belling MD  Appended Document: Rx for Penis Pump letter and rx mailed.

## 2010-08-02 NOTE — Progress Notes (Signed)
Summary: Rx Req  Phone Note Call from Patient Call back at Home Phone [phone]   Caller: Patient Summary of Call: Needs rx sent to [facility] for the penis pump that they have in for him.   Initial call taken by: Clydell Hakim,  Nov 02, 2009 3:29 PM  Follow-up for Phone Call        Was sent 10/06/2009 Follow-up by: Romero Belling MD,  Nov 02, 2009 3:42 PM

## 2010-08-18 ENCOUNTER — Encounter: Payer: Self-pay | Admitting: Family Medicine

## 2010-08-19 ENCOUNTER — Encounter: Payer: Self-pay | Admitting: Family Medicine

## 2010-09-05 ENCOUNTER — Encounter: Payer: Self-pay | Admitting: Family Medicine

## 2010-10-12 ENCOUNTER — Encounter: Payer: Self-pay | Admitting: Family Medicine

## 2010-10-12 ENCOUNTER — Ambulatory Visit (HOSPITAL_COMMUNITY): Payer: Medicare Other | Attending: Family Medicine

## 2010-10-12 ENCOUNTER — Ambulatory Visit (INDEPENDENT_AMBULATORY_CARE_PROVIDER_SITE_OTHER): Payer: Medicare Other | Admitting: Family Medicine

## 2010-10-12 DIAGNOSIS — R569 Unspecified convulsions: Secondary | ICD-10-CM

## 2010-10-12 DIAGNOSIS — R079 Chest pain, unspecified: Secondary | ICD-10-CM | POA: Insufficient documentation

## 2010-10-12 DIAGNOSIS — I10 Essential (primary) hypertension: Secondary | ICD-10-CM

## 2010-10-12 NOTE — Patient Instructions (Signed)
I'm glad that you are feeling better There was nothing that is indicating a heart attack I am going to send you to Cardiology for further evaluation Take your blood pressure medications and limit caffeine Please schedule an appointment in 1 week to check on your blood pressure and shortness of breath

## 2010-10-12 NOTE — Assessment & Plan Note (Addendum)
Not described like angina.  He does have risk factors.  Since yesterday it has resolved.  EKG was normal.  Will send back to Cardiology for risk stratification.  Precautions given for worsening of symptoms.  Patient voiced his understanding.

## 2010-10-12 NOTE — Assessment & Plan Note (Signed)
Did not take his BP medications this morning.  Educated him on the importance of taking his medications everyday.  Will have him go home and take his BP medications.  Will recheck BP in 1 week.

## 2010-10-12 NOTE — Assessment & Plan Note (Signed)
Appears stable.  Will defer further management to the [place]

## 2010-10-12 NOTE — Progress Notes (Signed)
  Subjective:    Patient ID: [name], male    DOB: 1951/04/23, 60 y.o.   MRN: [id number]  HPI 1. Chest pain:  He has had mild chest pain since he had a seizure on Sunday.  It is substernal.  Described as an occasional "twinge".  Denies any chest pressure.  He does endorse some shortness of breath.  Overall the chest pain and the shortness of breath has improved.  He denies any current chest pain.  He also had some associated left arm numbness but this too has improved.  2. Seizures:  He has about 3 seizures every month.  He is taking his seizure medications as prescribed.  [facility]  3. HTN:  He didn't take any of his medications this morning.  He also has had a couple cups of coffee and energy drinks   Review of Systems Denies fevers, cough, abdominal pain, LE swelling    Objective:   Physical Exam  Constitutional: No distress.       Obese  Neck: Normal range of motion. Neck supple. No JVD present. No tracheal deviation present. No thyromegaly present.  Cardiovascular: Regular rhythm.  Exam reveals no friction rub.   No murmur heard.      Tachycardic  Pulmonary/Chest: Effort normal and breath sounds normal. No respiratory distress. He has no wheezes. He has no rales.       Mild chest tenderness reproduced with palpation  Abdominal: Soft. Bowel sounds are normal. He exhibits no distension. There is no tenderness. There is no rebound.  Musculoskeletal: He exhibits no edema.  Skin: He is not diaphoretic.  Psychiatric: He has a normal mood and affect.          Assessment & Plan:

## 2010-10-17 LAB — BASIC METABOLIC PANEL
BUN: 9 mg/dL (ref 6–23)
Chloride: 104 mEq/L (ref 96–112)
Glucose, Bld: 114 mg/dL — ABNORMAL HIGH (ref 70–99)
Potassium: 4.4 mEq/L (ref 3.5–5.1)
Sodium: 137 mEq/L (ref 135–145)

## 2010-10-17 LAB — CBC
HCT: 43.5 % (ref 39.0–52.0)
Hemoglobin: 14 g/dL (ref 13.0–17.0)
MCV: 75.3 fL — ABNORMAL LOW (ref 78.0–100.0)
Platelets: 365 10*3/uL (ref 150–400)
RDW: 18.6 % — ABNORMAL HIGH (ref 11.5–15.5)
WBC: 8.8 10*3/uL (ref 4.0–10.5)

## 2010-10-17 LAB — HEPATIC FUNCTION PANEL
ALT: 35 U/L (ref 0–53)
AST: 32 U/L (ref 0–37)
Bilirubin, Direct: 0.2 mg/dL (ref 0.0–0.3)
Indirect Bilirubin: 0.6 mg/dL (ref 0.3–0.9)
Total Bilirubin: 0.8 mg/dL (ref 0.3–1.2)

## 2010-10-17 LAB — TYPE AND SCREEN

## 2010-10-17 LAB — POCT I-STAT 4, (NA,K, GLUC, HGB,HCT)
Glucose, Bld: 109 mg/dL — ABNORMAL HIGH (ref 70–99)
HCT: 37 % — ABNORMAL LOW (ref 39.0–52.0)

## 2010-11-01 ENCOUNTER — Encounter: Payer: Self-pay | Admitting: Internal Medicine

## 2010-11-04 ENCOUNTER — Institutional Professional Consult (permissible substitution): Payer: Medicare Other | Admitting: Internal Medicine

## 2010-11-15 NOTE — Discharge Summary (Signed)
[name], [name]                 ACCOUNT NO.:  [id number]   MEDICAL RECORD NO.:  [id number]          PATIENT TYPE:  INP   LOCATION:  3004                         FACILITY:  [facility]   PHYSICIAN:  Coletta Memos, M.D.     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   PROCEDURE:  Left L3-4 extraforaminal diskectomy.   COMPLICATIONS:  None.   DISCHARGE STATUS:  Alive and well.  Wound clean, some old blood on the  wound.  He is ambulating, voiding and tolerating a regular diet.  He had  an uncomplicated procedure and postoperatively has done well.  He will  be seen in the office in approximately 3-4 weeks.  He was given  instructions in no bending, lifting, or twisting.  He is not to drive  for at least 2 weeks.  He will be given Percocet and Valium for pain  medication.           ______________________________  Coletta Memos, M.D.     KC/MEDQ  D:  04/05/2008  T:  04/05/2008  Job:  [id number]

## 2010-11-15 NOTE — H&P (Signed)
[name], [name]                 ACCOUNT NO.:  [id number]   MEDICAL RECORD NO.:  [id number]          PATIENT TYPE:  INP   LOCATION:  2915                         FACILITY:  [facility]   PHYSICIAN:  Jesse Sans. Wall, MD, FACCDATE OF BIRTH:  [birth year]   DATE OF ADMISSION:  11/14/2007  DATE OF DISCHARGE:                              HISTORY & PHYSICAL   PRIMARY CARE PHYSICIAN:  Dr. Towana Badger, MD at [facility].   PRIMARY CARDIOLOGIST:  Dr.  __________   CHIEF COMPLAINT:  Chest pain.   HISTORY OF PRESENT ILLNESS:  [name] is a 59 year old man with no  previous history of coronary artery disease.  He had sudden onset of  substernal chest pain that he describes as a pressure at approximately  7:00 p.m. as he was getting ready to start [employer].  It reached an  8/10.  He also had orthostatic dizziness, weakness, shortness of breath,  diaphoresis, and nausea.  EMS was called.  His systolic blood pressure  was 97, so no nitroglycerin was given.  In the emergency room, his chest  pain although has returned a couple of times is apparently very brief.  He is not currently experiencing chest pain, but he does feel  intermittent waves of dizziness.  Of note, prior to going to [employer]  Class, he took a Lortab 10 mg tablet and for religious reasons has had  greatly decreased p.o. intake with no liquid since 7 o'clock this  morning and only minimal food.  Currently, he is being hydrated and  resting comfortably.   PAST MEDICAL HISTORY:  1. Hypertension.  2. Hyperlipidemia.  3. Family history of coronary artery disease.  4. Obesity.  5. History of seizure disorder.  6. Gout.  7. Obstructive sleep apnea on CPAP.  8. Chronic pain issues.   SURGICAL HISTORY:  Status post appendectomy, rotator cuff repair, and  left wrist surgery.   ALLERGIES:  CODEINE.   CURRENT MEDICATIONS:  Incomplete with the patient taking medications  both from [facility] and the [place].  According to  [facility] he has had:  1. Flexeril 10 mg q.8 hours.  2. Lortab 10 mg.  3. Cialis.  4. Albuterol MDI.  5. Lovaza 1 g daily.  6. Crestor 40 mg daily.  7. Nizoral shampoo.   According to the [place] he is on:  1. Levitra.  2. Amlodipine.  3. Felodipine.  4. Colchicine.  5. Allopurinol.  The list in the [place] may be incomplete as his wife was giving it from  memory.   SOCIAL HISTORY:  He lives in [place] with his wife and is a disabled  Cytogeneticist.  He denies any history of alcohol, tobacco, or drug use.   FAMILY HISTORY:  His mother died in her 66s with a history of coronary  artery disease and his father died in his 78s with no history of  coronary artery disease.  He has one brother without CAD.   REVIEW OF SYSTEMS:  He had sweats today with the discomfort, but he has  had no fevers or chills.  He had dyspnea on exertion and had some  chronic dyspnea on exertion as well as orthopnea that has not changed  recently.  His symptoms today are consistent with presyncope.  He denies  coughing or wheezing.  There has been no recent travel.  He has chronic  arthralgias and pain.  He had nausea today, but denies any general  history of hematemesis, hemoptysis, or reflux symptoms.  Full 14-point  review of systems is otherwise negative.   PHYSICAL EXAM:  VITAL SIGNS:  Temperature is 97.1, blood pressure 92/70,  pulse 66, respiratory rate 20, and O2 saturation 98% on non-rebreather.  GENERAL:  He is a well-developed, well-nourished African American male  in slight distress.  HEENT:  Normal.  NECK:  There is no lymphadenopathy, thyromegaly, bruit, or JVD noted.  CV:  His heart is regular in rate and rhythm with an S1 and S2 and no  significant murmur, rub, gallop is noted.  Distal pulses are intact in  all four extremities and no femoral bruits are appreciated.  LUNGS:  Essentially clear to auscultation bilaterally.  SKIN:  No rashes or lesions are noted, but he is diaphoretic.  ABDOMEN:  Soft  and nontender with active bowel sounds.  EXTREMITIES:  There is no cyanosis, clubbing, or edema noted.  MUSCULOSKELETAL:  There is no joint deformity or effusions and no spine  or CVA tenderness is noted.  NEURO:  He is alert and oriented.  Cranial nerves II-XII grossly intact.   Laboratory values and chest x-ray are pending.   EKG is sinus rhythm, rate 66 with no acute ischemic changes and no  change from an EKG dated 2007, although his lateral T-waves are abnormal  in that EKG as well as an EKG from today.   IMPRESSION:  Chest pain.  His EKG is stable from March 25, 2006.  His symptoms are consistent with a probable vasovagal event secondary to  fasting and Lortab on an empty stomach.  There is also a question of  duplicity of medications, if he is indeed taking 2 calcium channel  blockers.  His wife does say that he is no longer on Levitra and only  uses Cialis, but has not had that today.  We will cycle cardiac enzymes  and check a D-dimer as  well.  An echocardiogram will be ordered.  Further evaluation and  treatment will depend on the results of the above testing.  He will be  continued on his home medications with the exception of the blood  pressure medicine and he will be hydrated.      Theodore Demark, PA-C      Jesse Sans. Daleen Squibb, MD, [facility]  Electronically Signed    RB/MEDQ  D:  11/14/2007  T:  11/15/2007  Job:  [id number]   cc:   Towana Badger, M.D.

## 2010-11-15 NOTE — H&P (Signed)
[name], [name] NO.:  [id number]   MEDICAL RECORD NO.:  [id number]          PATIENT TYPE:  INP   LOCATION:  3015                         FACILITY:  [facility]   PHYSICIAN:  Hilda Lias, M.D.   DATE OF BIRTH:  [birth year]   DATE OF ADMISSION:  07/08/2008  DATE OF DISCHARGE:                              HISTORY & PHYSICAL   [name] is a gentleman who underwent a lumbar fusion many years ago.  The patient did really well, but later on he developed some unilateral  pain, which was taken care with extraforaminal diskectomy.  The patient  did well for awhile, but now he is having more pain, recurred to both  upper extremity associated with weakness dorsiflexion, both feet.  He  has pain with every single conservative treatment including physical  therapy, medication, epidural injection.  We repeated the x-ray, which  showed that he has a cervical degenerative disk disease with a scar  tissue a part of L4 disk right at the level of the previous fusion.  Because of that, the patient is being admitted for surgery.  It is  important to worry out that in some x-rays at the area where he had the  surgery before is called L5-S1 and the level that is called L4-L5.  Nevertheless the area we are going to do surgery today it will be right  above from the previous fusion, which can be called 3-4 by some x-rays  report or 4-5 by another one.   PAST MEDICAL HISTORY:  1. Lumbar fusion.  2. He has had lumbar diskectomy many years ago x2.   ALLERGIES:  He is allergic to CODEINE.   SOCIAL HISTORY:  Does not smoke, does not drink.  He is 5 feet 8 inches  and 270 pounds.   FAMILY HISTORY:  Unremarkable.   REVIEW OF SYSTEMS:  Positive for back pain, anxiety, depression.   PHYSICAL EXAMINATION:  GENERAL:  The patient came to my office in  general condition with his wife, has difficulty sitting and standing.  HEAD, EARS, NOSE, AND THROAT:  Normal.  NECK:  Normal.  LUNGS:   Clear.  CARDIOVASCULAR:  Heart sounds normal.  ABDOMEN:  Normal.  EXTREMITIES:  Normal pulses.  NEURO:  He has a weak dorsiflexion, both feet.  His straight leg raising  is positive bilateral above 60 degrees.  He has a decreased flexion of  the lumbar spine.   The x-rays showed that the narrow at the right above the fusion at the  level 4-5 with a narrow right above the previous fusion with a herniated  disk and scar tissue without a foraminal stenosis.   CLINICAL IMPRESSION:  Chronic back pain with degenerative disk disease  at 4-5 status post fusion 5-1.   RECOMMENDATIONS:  The patient is being admitted for surgery.  Procedure  will be diskectomy and fusion at both from the previous one.  The  patient knows about the risks such as infection, CSF leak, worsening  pain, paralysis, need for the surgery.  ______________________________  Hilda Lias, M.D.     EB/MEDQ  D:  07/08/2008  T:  07/09/2008  Job:  [id number]

## 2010-11-15 NOTE — Op Note (Signed)
NAMEANACLETO, BATTERMAN                 ACCOUNT NO.:  [id number]   MEDICAL RECORD NO.:  [id number]          PATIENT TYPE:  INP   LOCATION:  3004                         FACILITY:  [facility]   PHYSICIAN:  Hilda Lias, M.D.   DATE OF BIRTH:  11-Sep-1950   DATE OF PROCEDURE:  DATE OF DISCHARGE:                               OPERATIVE REPORT   PREOPERATIVE DIAGNOSIS:  Left L3-4 intra and extraforaminal herniated  disk, mostly extraforaminal, status post L4-5 fusion.   POSTOPERATIVE DIAGNOSIS:  Left L3-4 intra and extraforaminal herniated  disk, mostly extraforaminal, status post L4-5 fusion.   PROCEDURE:  Left L3-4 extraforaminal diskectomy, decompression of the L3  nerve root.  Microscope.   SURGEON:  Hilda Lias, MD   ASSISTANT:  Coletta Memos, MD   CLINICAL HISTORY:  Mr. Heidelberger is a gentleman who had undergone fusion at  L4-5.  The patient had been doing fairly well, but for 2-3 weeks, he had  complained of intensive pain going to the left leg which has not  improved with conservative treatment.  X-ray shows he has intra and  extraforaminal and he has distal soft calcification compromising the L3  nerve root.  The patient wanted to proceed with surgery.  The risks were  explained to him including possibility of no improvement from the  surgery with regard to pain also.   PROCEDURE:  The patient was taken to the OR and was positioned in a  prone manner.  He is otherwise heavily built, he is 125 kilos.  Incision  was made and we went to the previous incision.  We went through a thick  layer of mass.  It was difficult because we did not have a retractor  enough to retract the area.  The first x-ray showed that the probe was  higher at the level of L3.  Thereon we started to dissect and we found  the pedicle of L4 on the left side.  We continued our dissection until  we found the take off of the L4 transverse process.  Thereon we started  drilling lateral to the facet.  The  intertransverse ligament and the  muscle were removed.  Immediately, we found that the L3 nerve root was  swollen.  It was flat and it was adherent to the disk.  Lysis was  accomplished and we were able to get into the disk space where several  fragments of degenerative disk was removed.  At the end, we had good  decompression with plane going through L3 nerve root.  Having done this,  we investigated.  We went to the disk space and we removed the rest of  the degenerative disk.  Irrigation was done.  Hemostasis was  accomplished.  Valsalva maneuver was negative.  Fentanyl and Depo-Medrol  were left at the area of the surgical site and the wound was closed with  Vicryl and Steri-Strips.           ______________________________  Hilda Lias, M.D.     EB/MEDQ  D:  04/03/2008  T:  04/04/2008  Job:  [id number]

## 2010-11-15 NOTE — Discharge Summary (Signed)
[name], [name]                 ACCOUNT NO.:  [id number]   MEDICAL RECORD NO.:  [id number]          PATIENT TYPE:  INP   LOCATION:  3015                         FACILITY:  [facility]   PHYSICIAN:  Hilda Lias, M.D.   DATE OF BIRTH:  Aug 04, 1950   DATE OF ADMISSION:  07/08/2008  DATE OF DISCHARGE:  07/13/2008                               DISCHARGE SUMMARY   ADMISSION DIAGNOSIS:  L3-L4 degenerative disk disease, chronic  radiculopathy status post fusion at L4-5.   FINAL DIAGNOSIS:  L3-L4 degenerative disk disease, chronic radiculopathy  status post fusion at L4-5.   CLINICAL HISTORY:  The patient was admitted to the hospital because of  back pain with radiation to both legs.  Previously, this patient had  surgery at the level of L4-5.  X-ray showed degenerative disk disease at  level of L3-4.  Surgery was advised.   LABORATORY DATA:  Normal.   COURSE IN THE HOSPITAL:  The patient was taken to surgery and 3-4  diskectomy and fusion was done.  Although the patient is doing much  better now, he complains of bilateral leg pain.  During surgery, we  found quite a bit of scar tissue affecting the nerve roots.  Today, he  is stable, he is taking pain medication by mouth, and he is being  discharged to be followed by me in my office.   CONDITION ON DISCHARGE:  Stable.   MEDICATIONS:  Oxycodone and Flexeril.   DIET:  Regular.   ACTIVITY:  Not to drive.   FOLLOWUP:  He is going to be seen by me in 4 weeks.           ______________________________  Hilda Lias, M.D.     EB/MEDQ  D:  07/13/2008  T:  07/13/2008  Job:  [id number]

## 2010-11-15 NOTE — H&P (Signed)
NAME:  Ronald, Mckee NO.:  [id number]   MEDICAL RECORD NO.:  [id number]          PATIENT TYPE:  INP   LOCATION:  3004                         FACILITY:  [facility]   PHYSICIAN:  Hilda Lias, M.D.   DATE OF BIRTH:  04-04-1951   DATE OF ADMISSION:  04/03/2008  DATE OF DISCHARGE:                              HISTORY & PHYSICAL   [name] is a gentleman who was seen in my office few days ago because  of sudden onset of back pain with radiation to the left leg which  according to him had been going for last 3 or 4 weeks which is no better  despite taking considerable amount of pain medication and muscle  relaxant.  The pain is from his back down to the hip and then to the  left thigh anteriorly but not below the knee.  He is quite miserable.  He barely can walk or work.  He can not sleep.  We did outpatient  myelogram.  Because of the finding, he want to proceed with surgery.   PAST MEDICAL HISTORY:  Lumbar fusion at the L4-5.   ALLERGIES:  He is allergic to CODEINE.   He is taking gabapentin for seizure disorder which had been followed up  by the [facility].  He is also taking metoprolol.  Also, he is taking  Lipitor.   SOCIAL HISTORY:  Negative.  He is 5' 9, 270 pounds.   FAMILY HISTORY:  Unremarkable.   REVIEW OF SYSTEMS:  Positive for nausea and slight depression.   PHYSICAL EXAMINATION:  GENERAL:  The patient came to my office.  He was  limping from the left leg.  HEAD, EARS, NOSE, and THROAT:  Normal.  NECK:  Normal.  LUNGS:  Clear.  HEART:  Sounds normal.  ABDOMEN:  Normal.  EXTREMITIES:  Normal.  NEUROLOGIC:  Mental status normal.  Cranial nerve normal.  Strange  weakness of the left quadriceps and slightly weak noted of dorsiflexion  in the left foot.  The straight-leg raising, is positive above 30  degrees.   The myelogram showed that he has at the level of L3-4 a large herniated  disk intra and extraforaminally compromising the entry nerve  root.  The  fusion that he had was at the level of L4-5.  That looks completely  normal.   CLINICAL IMPRESSION:  Left L3-4  herniated disk with mostly  intraforaminal component.   RECOMMENDATIONS:  The patient is being admitted for surgery.  The  procedure will be a left L3-4 intra and extraforaminal diskectomy.  He  is fully awake and if we feel confident only to go extraforaminal then  we do not  have to do any intraforaminal and that way we will prevent further  weakness of the lumbar spine in view of the previous fusion.  The risk  of poor or no improvement, continuation of the pain plus  spondylolisthesis and need of further surgery which might require  fusion.     ______________________________  Michel Harrow, M.D.    ______________________________  Hilda Lias, M.D.  KB/MEDQ  D:  04/03/2008  T:  04/04/2008  Job:  [id number]

## 2010-11-15 NOTE — Discharge Summary (Signed)
[name], VERNIER NO.:  [id number]   MEDICAL RECORD NO.:  [id number]          PATIENT TYPE:  INP   LOCATION:  2020                         FACILITY:  [facility]   PHYSICIAN:  Ronald Sidle, MD DATE OF BIRTH:  10-01-1950   DATE OF ADMISSION:  11/14/2007  DATE OF DISCHARGE:  11/16/2007                               DISCHARGE SUMMARY   PROCEDURES:  1. Cardiac catheterization.  2. 2D echocardiogram.   FINAL DISCHARGE DIAGNOSES:  1. Minor coronary atherosclerosis.  2. Orthostasis.   SECONDARY DIAGNOSES:  1. Hypertension.  2. Hyperlipidemia.  3. Obesity.   TIME AT DISCHARGE:  43 minutes.   HOSPITAL COURSE:  [name] is a 60 year old male with no previous  history of obstructive coronary artery disease.  He had been fasting and  took 10 mg Lortab.  About an hour and half later, he was complaining of  orthostatic dizziness, as well as shortness of breath, diaphoresis,  chest pain, and nausea.  He came to the emergency room, where his EKG  was unchanged from previous EKGs and his symptoms improved with IV  fluids.  He was admitted for further evaluation.   He had renal insufficiency on admission with a BUN of 16 and creatinine  of 2.02, GFR 42.  He was hydrated and his diuretics were held.  At  discharge, his BUN was 14 with a creatinine of 1.51 and GFR of 58.  He  was advised that if he was going to limit his fluid p.o. intake, he  should also avoid diuretic.   His CKs were elevated with a slightly elevated MB, peak 554/6.1, but the  index was normal and all troponins were negative.  A lipid profile was  performed, which showed total cholesterol 183, triglycerides 141, HDL  26, and LDL 129.  A heart-healthy diet was encouraged.  TSH was elevated  at 6.322, so a free T4 was checked and was normal at 1.26, T3 uptake was  normal at 35.3.  D-dimer was within normal limits at 0.33 and BNP was  less than 30.  An echocardiogram showed an EF of 60%-70% with  moderately  increased left ventricular wall thickness and no significant valvular  abnormalities.   Dr. Juanda Chance was concerned about the patient's chest pain and multiple  cardiac risk factors.  A cardiac catheterization was performed which  showed essentially normal coronaries.  On Nov 16, 2007, [name] was  ambulating without chest pain or shortness of breath.  His presyncope  was felt to be possibly neurocardiogenic and related to relative  dehydration.  He was considered stable for discharge on Nov 16, 2007.  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  Initially, the patient was thought to have prescriptions  for both Cialis and Levitra, but stated that he did not have Levitra,  and had not taken Cialis.  He was also on Exforge, which is valsartan  and amlodipine, and also reportedly, he is taking amlodipine 10 mg a  day.  He is asked to hold this.  He is requested  to take all of his  medication bottles to his family doctor to make sure that there is no  true duplication.   DISCHARGE INSTRUCTIONS:  His activity level is to be increased  gradually.  He is not to drive for 2 days and do no lifting for week.  He is to call our office for problems with the cath site.  He is to  stick to a low-sodium heart-healthy diet.  He is to follow up with Dr.  Daleen Squibb as needed.  He is to follow up with Dr. Mannie Stabile and see within 2 weeks.   DISCHARGE MEDICATIONS:  1. Flexeril 10 mg q.8 h. p.r.n.  2. Lovaza 1 g daily.  3. Crestor 40 mg a day.  4. Aspirin 81 mg daily.  5. Colchicine 0.6 mg daily.  6. Keppra 500 mg 4 tablets daily.  7. Neurontin  600 mg t.i.d.  8. Allopurinol 300 mg daily.  9. Lyrica 150 mg b.i.d.  10.Amlodipine 10 mg, is on hold.  11.Prilosec 20 mg daily.  12.Coreg CR 80 mg a day.  13.Lidoderm patch as needed.  14.Cialis as needed.  15.Vicodin, as prior to admission.  16.Albuterol MDI p.r.n.  17.HCTZ 25 mg a day.  18.Exforge 325/10 mg  daily.  19.Nizoral shampoo daily.      Theodore Demark, PA-C      Ronald Sidle, MD  Electronically Signed    RB/MEDQ  D:  11/16/2007  T:  11/17/2007  Job:  [id number]   cc:   Towana Badger, M.D.

## 2010-11-15 NOTE — Op Note (Signed)
[name], [name]                 ACCOUNT NO.:  [id number]   MEDICAL RECORD NO.:  [id number]          PATIENT TYPE:  INP   LOCATION:  3015                         FACILITY:  [facility]   PHYSICIAN:  Hilda Lias, M.D.   DATE OF BIRTH:  [birth year]   DATE OF PROCEDURE:  07/08/2008  DATE OF DISCHARGE:                               OPERATIVE REPORT   PREOPERATIVE DIAGNOSIS:  L3-L4 degenerative disk disease with bilateral  radiculopathy, scar tissue status post fusion L4-L5.   POSTOPERATIVE DIAGNOSIS:  L3-L4 degenerative disk disease with bilateral  radiculopathy, scar tissue status post fusion L4-L5.   PROCEDURES:  Bilateral L3 laminectomy, bilateral L3-L4 diskectomy,  interbody fusion with cages, facetectomy, posterolateral fusion with  master graft, BMP, and autograft, pedicle screws at L3-L4, lysis of  adhesions, Cell Saver, C-arm.   SURGEON:  Hilda Lias, MD   ASSISTANT:  Clydene Fake, MD.   CLINICAL HISTORY:  Mr. Karstens is a gentleman who in the past underwent  fusion of L4-5.  Later on he developed herniated disk at the level of 3-  4 and  surgery was advised.  He did well, but now he is complaining of  more back pain radiating to both legs.  He had failed conservative  treatment.  X-rays showed that right above the area where he had  previous fusion he has had herniated disk with stenosis, degenerative  disk disease, and foraminal compromise.  Because of failure of  conservative treatment, surgery was advised.  Now, it is very important  to point out in this case that the area we are calling  L3-L4, by x-ray  had been read as L4-L5.  The area where he had surgery before had been  read as L4-L5 and another x-ray had been read as L5-S1.  The most  important part he has the surgical lesion is above from the area where  he had the previous fusion.  That is the main reason why the name  change from operative report to x-ray essentially is the level above the  previous  surgery.   PROCEDURE:  The patient was taken to the OR and after intubation, the  skin was cleaned with DuraPrep.  Midline incision from the previous one  was made.  The patient was quite muscular and it was difficult to feel  any spinous process  following the previous incision.  We went down  until we received the lower present spinous process.  Then, retraction  was carried all the way laterally.  This part of the procedure was set  to across at least 40 minutes because of the mild fibrosis that he has.  At the end, we were able to see the spinous process of L3 and the upper  part where the previous pedicle screws at the level of L4-L5 was  present.  Then, we proceeded with removal of the spinous process of L3.  On removal of spinal process of L3, we were able to get into the spinal  canal.  The patient has quite a bit of thick yellow ligament.  Lysis was  done.  We were able to see the dural sac.  From then on, we did more  lysis of adhesion and we had to remove the facet just to get into the  disk space.  We entered the disk space and total gross diskectomy was  achieved.  The endplate were removed and two cages of 10 x 22 were  inserted.  The cages were full of BMP lactograft and  Autograft.  The  rest of the disk space was filled up with autograft and BMP.  From then  on, we make sure that we did lysis of adhesion and we were able to  decompress both the L3 and L4 nerve root all the way laterally until we  had plenty of space for both of them using the curved Kerrison punch.  Then, we removed the previous cap of the fusion at the level of L4-L5  and the rods were removed.  Then, using the C-arm, we drilled the  lateral aspect of the pedicle of 3 , and we inserted two screws of 6.5 x  45.  Then these were reconnected using a rod from L2 through L4 with  caps in place.  We went laterally.  We drilled the lateral aspect of the  facet, as well as the  transverse process and a mix of  BMP and autograft was used for  arthrodesis.  Once again, we came back and throughout the area where he  had the decompression, there was plenty of space at the L3-L4 nerve  root.  From then on, fentanyl was left in the pleural space and the  wound was closed with Vicryl and Steri-Strips.           ______________________________  Hilda Lias, M.D.     EB/MEDQ  D:  07/08/2008  T:  07/09/2008  Job:  [id number]

## 2010-11-18 NOTE — Discharge Summary (Signed)
NAMESIEGFRIED, VIETH                 ACCOUNT NO.:  [id number]   MEDICAL RECORD NO.:  [id number]          PATIENT TYPE:  INP   LOCATION:  3027                         FACILITY:  [facility]   PHYSICIAN:  Clydene Fake, M.D.  DATE OF BIRTH:  [birth year]   DATE OF ADMISSION:  03/23/2006  DATE OF DISCHARGE:  03/28/2006                                 DISCHARGE SUMMARY   ADMISSION DIAGNOSES:  1. Degenerative disk disease.  2. Spondylosis.  3. Radiculopathy at L5-S1.   DISCHARGE DIAGNOSES:  1. Degenerative disk disease.  2. Spondylosis.  3. Radiculopathy at L5-S1.   PROCEDURE:  L5 laminectomy, facetectomy, diskectomy, interbody fusion with  cage, posterolateral fusion, and pedicle screw fixation.   REASON FOR PROCEDURE:  The patient has back and leg pain, has had 2 prior  lumbar surgeries.  Nonsurgical management has not helped.  MRI was done  showing degenerative disk disease at L5-S1. He was brought in for  decompression and fusion.   HOSPITAL COURSE:  The patient was admitted the day of surgery and underwent  the procedure above without complications.  Postop the patient was  transferred to the recovery room and then to the floor.  He has some medical  history of COPD issues and pulmonary medicine was consulted for some  concurrent care.  The patient was watched in the ICU.  The next day on the  22nd, he was doing fairly well.  He did have a little confusion due to the  PCA morphine which was discontinued and he was better, and he was  transferred to a stepdown unit.  Continued on CPAP for oxygenation and made  significant progress.  Did have a mild ileus on the 23rd, needs to work on  increasing his activity, but later on the 23rd he started complaining of  chest pain and shortness of breath.  The family practice teaching service  was consulted for concurrent care.  Mainly he will be watched in the  stepdown unit.  We got PT and OT to work with the patient.  His chest pain  was  resolved.  Thought to be more respiratory because as his lungs improved,  his symptoms improved.  Pulmonary medicine signed off on March 27, 2006,  because he started to improve and on March 28, 2006, the patient was  discharged home in stable condition.   DISCHARGE MEDICATIONS:  Percocet and Flexeril p.r.n.   FOLLOWUP:  Dr. Jeral Fruit in a few weeks.  No stress activity.  Follow up with  his regular medical doctor.           ______________________________  Clydene Fake, M.D.    JRH/MEDQ  D:  04/19/2006  T:  04/20/2006  Job:  [id number]

## 2010-11-18 NOTE — Op Note (Signed)
NAME:  [name], [name]                             ACCOUNT NO.:  [id number]   MEDICAL RECORD NO.:  [id number]                   PATIENT TYPE:  AMB   LOCATION:  DAY                                  FACILITY:  [facility]   PHYSICIAN:  Sharlet Salina T. Hoxworth, M.D.          DATE OF BIRTH:  [birth year]   DATE OF PROCEDURE:  07/29/2003  DATE OF DISCHARGE:                                 OPERATIVE REPORT   PREOPERATIVE DIAGNOSIS:  Perirectal abscess.   POSTOPERATIVE DIAGNOSIS:  Perirectal abscess.   PROCEDURE:  I&D of perirectal abscess with fistulotomy.   SURGEON:  Lorne Skeens. Hoxworth, M.D.   ANESTHESIA:  General.   BRIEF HISTORY:  [name] is a 61 year old black male who presents with a 2-  3 day history of increasingly severe rectal pain and now purulent drainage.  Examination has revealed purulent drainage from the anus and induration and  tenderness in the left perirectal space.  Incision and drainage under  general anesthesia has been recommended and accepted. The nature of the  procedure, risks of bleeding and infection were discussed and understood. He  has been given broad spectrum antibiotics. He is now brought to the  operating room for this procedure.   DESCRIPTION OF PROCEDURE:  The patient was brought to the operating room,  placed in the supine position on the operating table and general  endotracheal anesthesia was induced.  He was then carefully positioned in  the lithotomy position with the perineum sterilely prepped and draped.  Examination revealed as previously noted induration and some fluctuance in  the left posterior perirectal space with some purulent drainage from the  anus.  An incision was made in the perirectal space as close to the anus as  possible and moderate sized abscess cavity entered, purulent material  drained and cultured.  There was a tract extending up toward the dentate  line and a rectal retractor was placed and there was a clear internal  opening with  purulent drainage and I was easily able to pass a hemostat from  the abscess cavity into the internal opening in the rectum. Fistulotomy was  then performed between the internal opening and the abscess cavity.  This  appeared to divide a minimal portion of the anal sphincter and the  fistulotomy was completed.  The abscess cavity was irrigated, hemostasis was  obtained with cautery. The soft tissue was irrigated with Marcaine with  epinephrine.  A Betadine gauze sponge was used to pack the cavity dry.  A  sterile dressing was applied and he was taken to the recovery room in good  condition.                                               Lorne Skeens. Hoxworth, M.D.    Tory Emerald  D:  07/29/2003  T:  07/29/2003  Job:  [id number]

## 2010-11-18 NOTE — H&P (Signed)
NAME:  [name], FAVIA NO.:  [id number]   MEDICAL RECORD NO.:  [id number]          PATIENT TYPE:  INP   LOCATION:  3309                         FACILITY:  [facility]   PHYSICIAN:  Hilda Lias, M.D.   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  In  the past, this gentleman has had lumbar diskectomy by somebody else, the  last one in 1999.  He had been seen __________  since then.  For the past 6  months, the pain is getting worse.  He has difficulty walking, sleeping and  standing.  Patient had an MRI since last evaluation.   PAST MEDICAL HISTORY:  Lumbar diskectomy x2.   ALLERGIES:  HE IS ALLERGIC TO CODEINE.   SOCIAL HISTORY:  Negative.  He is 5 foot 9, and he weighs over 265 pounds.   FAMILY HISTORY:  Unremarkable.   REVIEW OF SYSTEMS:  Positive for headache, cough, nausea, back pain,  anxiety.   PHYSICAL EXAMINATION:  GENERAL: A patient who came to our office and had  difficulty sitting or standing.  HEENT:  Head, nose and throat normal.  NECK:  Normal.  LUNGS:  Distant rhonchi bilaterally.  CARDIOVASCULAR:  Normal.  ABDOMEN:  Normal.  EXTREMITIES:  Patient has scarring to the lumbar area from previous surgery.  He had difficulty walking on his heels and squatting.  NEUROLOGIC:  He has weak dorsiflexion both feet.  He has decrease of  flexibility of the lumbar spine.  Sensation:  He has some numbness on top of  both feet.   The x-ray showed he has severe atrophy of the level 4-5, right worse than  the left one.   IMPRESSION:  Degenerative disk disease of the level of 4-5 .  __________  radiculopathy.   On admission, the patient is being admitted for surgery.  The procedure  would be as L4-5 diskectomy with  interbody fusion and pedicle screws.  He  knows about the risks of infection, cerebrospinal fluid leak, no improvement  whatsoever, damage to the vessel, to the abdomen, damage to the nerve going  to the bladder or bowel.  The  procedure is being described as an L5-1 diskectomy, but this gentleman seems  to have a 6th lumbar vertebrae.  Nevertheless, the procedure to be more  accurate would be from the second space from below, where he previously had  surgery.           ______________________________  Hilda Lias, M.D.     EB/MEDQ  D:  03/23/2006  T:  03/25/2006  Job:  [id number]

## 2010-11-18 NOTE — Cardiovascular Report (Signed)
NAMEDRURY, ARDIZZONE                 ACCOUNT NO.:  [id number]   MEDICAL RECORD NO.:  [id number]          PATIENT TYPE:  OIB   LOCATION:  2899                         FACILITY:  [facility]   PHYSICIAN:  Ricki Rodriguez, M.D.  DATE OF BIRTH:  1950-07-13   DATE OF PROCEDURE:  02/27/2005  DATE OF DISCHARGE:                              CARDIAC CATHETERIZATION   REFERRING PHYSICIAN:  Lawerance Sabal, M.D.   PROCEDURE:  Left heart catheterization, selective coronary angiography, left  ventricular function study.   INDICATIONS FOR PROCEDURE:  This 60 year old black male had typical chest  pain, abnormal nuclear stress test along with risk factors of hypertension  and hyperlipidemia.   APPROACH:  Right femoral artery using 4 French sheath and catheters.   COMPLICATIONS:  None.   Less than 42 mL of dye was used.   HEMODYNAMIC DATA:  The left ventricular pressure was 125/21 and aortic  pressure was 118/83.   CORONARY ANATOMY:  The left main coronary artery was unremarkable.   Left anterior descending coronary artery was unremarkable, including its  diagonal branch.   The left circumflex coronary artery was also unremarkable with a small ramus  branch, a small OM1, larger OM2 with superior inferior divisions.   Right coronary artery was dominant and its posterior descending coronary  artery and posterolateral branches were unremarkable.   Left ventriculogram:  The left ventriculogram showed mild hyperdynamic wall  motion with ejection fraction up to 70 to 75%.   IMPRESSION:  1.  Normal coronaries.  2.  Normal left ventricular systolic function.   RECOMMENDATIONS:  This patient will have noncardiac chest pain evaluation  and continue his efforts toward control of hypertension and hyperlipidemia.      Ricki Rodriguez, M.D.  Electronically Signed     ASK/MEDQ  D:  02/27/2005  T:  02/27/2005  Job:  [id number]   cc:   Lawerance Sabal, MD  Fax: [phone]

## 2010-11-18 NOTE — Op Note (Signed)
[name], [name]                 ACCOUNT NO.:  [id number]   MEDICAL RECORD NO.:  [id number]          PATIENT TYPE:  INP   LOCATION:  3172                         FACILITY:  [facility]   PHYSICIAN:  Hilda Lias, M.D.   DATE OF BIRTH:  02-Nov-1950   DATE OF PROCEDURE:  DATE OF DISCHARGE:                                 OPERATIVE REPORT   PREOPERATIVE DIAGNOSES:  1. L5-S1 degenerative disk disease with facet arthropathy, chronic L5-S1      radiculopathy, status post 2 lumbar diskectomies.  2. Obesity.   POSTOPERATIVE DIAGNOSES:  1. L5-S1 degenerative disk disease with facet arthropathy, chronic L5-S1      radiculopathy, status post 2 lumbar diskectomies.  2. Obesity.   PROCEDURE:  L5 laminectomy, facetectomy.  Bilateral diskectomy.  Interbody  fusion with a cage.  Posterior arthrodesis.  Pedicle screws.  Lysis of  adhesions.  C-arm.  Cell saver.   SURGEON:  [place].   ASSISTANT:  Dr. Phoebe Perch.   CLINICAL HISTORY:  The patient is a gentleman who was seen by me because of  back pain with radiation to both legs.  He has 2 back surgeries previously  in [place].  The patient does not recall who was the surgeon.  The  patient has been complaining of back pain, radiates to both legs.  He failed  with conservative treatment.  He has weakness on dorsiflexion of both legs.  MRI showed degenerative disk disease of the L5-1.  This is the second space  from below.  Surgery was advised.  The risk was as explained to him  including the possibility of infection, __________, no improvement  whatsoever, weakness.   SURGERY:  The patient was taken to the OR, and he was positioned in the  appropriate manner.  The back was prepped with DuraPrep.  A midline incision  resecting the previous cut was made.  The patient was quite muscular, and  patient had quite a bit of adhesions.  It was difficult to retract laterally  __________.  We were able to retract down past the facet bilaterally.  We  did 2  x-rays, and the last one showed __________ at the L5-1.  This was the  second space from below, and so operative report is being considered at L4-  5.  When we confirmed the correct space, we removed the lamina of spinal  process L5.  The patient has quite a bit of scar tissue up to the point that  it took [place] at least 30 minutes to do lysis and be able to visualize the dura  matter.  Then, using the drill as well as the 2 or 3-mm Kerrison punch, we  removed the facet of L5.  Lysis was accomplished.  The worst area was at L5-  1 on the left side where the nerve was completely surrounded by scar tissue.  At the end, we had good decompression, but there is no question that the L5  nerve root was __________.  The other one seemed to be also compromised with  scar tissue that had the same appearance.  Having  done this, we entered the  disk space using a curette.  We looked to do a total gross diskectomy.  We  introduced 2 cages with BMP and autograft of 10 x 22.  The rest of BMP and  autograft were used to fill out the rest of the disk space.  Then with the C-  arm utilizing the AP and lateral views, we visualized the pedicles of L5-S1.  Pedicle probes were inserted followed by a tap.  At the level of S1, we  inserted two screws, 6.5 x 50, and two at the level of L5, 6.5 x 45.  During  the procedure, we were assured that we have bone surrounding, and there was  no compromise of the medial aspect of the pedicle.  At the end, we had a  good insertion of the pedicle screws.  Rods were applied and was kept in  place with caps.  Then we went back in, and we explored the L5-S1 nerve  root, and there was no compromise whatsoever of the medial aspect of the  pedicle and the nerve root was free in the foramen.  Then, Valsalva maneuver  was negative.  The wound was closed with Vicryl and Steri-Strip.           ______________________________  Hilda Lias, M.D.     EB/MEDQ  D:  03/23/2006  T:   03/23/2006  Job:  [id number]

## 2010-11-18 NOTE — Procedures (Signed)
NAME:  [name], [name]                 ACCOUNT NO.:  [id number]   MEDICAL RECORD NO.:  [id number]          PATIENT TYPE:  OUT   LOCATION:  SLEEP CENTER                 FACILITY:  [facility]   PHYSICIAN:  Clinton D. Maple Hudson, M.D. DATE OF BIRTH:  08-21-1950   DATE OF STUDY:                              NOCTURNAL POLYSOMNOGRAM   REFERRING PHYSICIAN:  Dr. Doralee Albino.   DATE OF STUDY:  July 27, 2005.   INDICATION FOR STUDY:  Hypersomnia with sleep apnea.   EPWORTH SLEEPINESS SCORE:  10/24.   BMI:  36.   WEIGHT:  256 pounds.   The patient has a seizure disorder.   SLEEP ARCHITECTURE:  Total sleep time 456 minutes with sleep efficiency 98%.  Stage I was 1%, stage II 89%, stages III and IV absent, REM 10% of total  sleep time. Sleep latency 1 minute, REM latency 130 minutes, awake after  sleep onset 6.5 minutes, arousal index 7.2. Gabapentin 400 milligrams and  clonazepam 1 milligram were taken at bedtime. Seizure medications had been  taken at 9:30 and a repeat clonazepam at 10:15 p.m.   RESPIRATORY DATA:  Split study protocol:  Apnea/hypopnea index (AHI, RDI)  17.7 obstructive events per hour indicating moderate obstructive sleep  apnea/hypopnea syndrome. This included 37 obstructive apneas and 26  hypopneas before C-PAP. He slept only on his back and all events were  reported in that position. REM AHI of 23.5 per hour. C-PAP was titrated to  20 CWP, AHI 0 per hour. A Respironics comfort full-face mask was used with  heated humidifier.   OXYGEN DATA:  Loud snoring with oxygen desaturation to a nadir of 58%.  Oxygen saturation at final C-PAP ranged 90-96% on room air.   CARDIAC DATA:  Normal sinus rhythm with ST depression and T-wave inversion  on the recorded lead.   MOVEMENT/PARASOMNIA:  Occasional leg jerk. No abnormal muscle movement or  behavior was noted.   IMPRESSION/RECOMMENDATIONS:  1.  Moderate obstructive sleep apnea/hypopnea syndrome, AHI 17.7 per hour      with all  sleep/events while supine. Loud snoring with oxygen      desaturation to a nadir of 58%.  2.  C-PAP titration to 20 CWP, AHI 0 per hour using a Respironics comfort      full-face mask with heated      humidifier.  3.  Additional EEG leads were added but seizure activity was not recognized      on the study.      Clinton D. Maple Hudson, M.D.  Diplomate, Biomedical engineer of Sleep Medicine  Electronically Signed     CDY/MEDQ  D:  07/30/2005 13:19:57  T:  07/31/2005 02:59:43  Job:  [id number]

## 2010-11-18 NOTE — Op Note (Signed)
[place]  Patient:    [name], [name] Visit Number: [id number] MRN: [id number]          Service Type: DSU Location: [facility] Attending Physician:  Marlowe Shores Dictated by:   Artist Pais Mina Marble, M.D. Proc. Date: 03/13/01 Admit Date:  03/13/2001                             Operative Report  PREOPERATIVE DIAGNOSIS:  Left wrist internal derangement.  POSTOPERATIVE DIAGNOSIS:  Left wrist internal derangement.  OPERATION/PROCEDURE:  Left wrist arthroscopy with debridement of partial scapholinear osseus ligament tear.  SURGEON:  Artist Pais. Mina Marble, M.D.  ASSISTANT:  Junius Roads. Ireton, P.A.C.  ANESTHESIA:  General anesthesia.  TOURNIQUET TIME:  Forty minutes.  COMPLICATIONS:  None.  DRAINS:  None.  DESCRIPTION OF PROCEDURE:  The patient was taken to the operating room. After the induction of adequate general anesthesia, the left upper extremity was prepped and draped in the usual sterile fashion.  An Esmarch was used to exanguinate the limb and tourniquet was inflated to 250 mmHg.  At this point in time the left upper extremity was placed padded in the Concept wrist traction tower and 12 pounds of counter traction was placed across the radial carpal joint.  A 3-4 arthroscopic portal was established after a blunt dissection was carried down to the capsule and trocar was introduced into the radial carpal joint.  Visualization of the radial carpal joint revealed intact radial side of the ligaments, partially torn scapholinear osseus ligament with some mild fraying and normal ulnar sided area.  Under direct vision an 18-gauge needle was used to establish a 6U outflow portal followed by establishment of 4-5 working portal. Once this was done, the ulnar side was debrided of some mild hypertrophic synovium and visualization then revealed again the partially torn scapholinear osseus ligament. It was mostly the volar side. This was debrided using  a combination of 2.9 suction shaver and 1.5 mm Arthrocare wand. Once that was done, the instruments were removed from the wrist. There was no evidence of frank instability and the majority of the scapholinear osseus ligament was intact. Therefore, no pinning was performed. The wound was then irrigated through the scope and portals were closed with 4-0 nylon, Marcaine 0.25% plain was injected through the 18-gauge needle and the 6U outflow position and then removed.  The sterile dressing of Xeroform, 4 x 4s, fluffs and volar splint was applied.  The patient tolerated the procedure well, went to the recovery room in stable condition. Dictated by:   Artist Pais Mina Marble, M.D. Attending Physician:  Marlowe Shores DD:  03/13/01 TD:  03/13/01 Job: [id number]

## 2010-12-05 ENCOUNTER — Institutional Professional Consult (permissible substitution): Payer: Medicare Other | Admitting: Internal Medicine

## 2011-01-18 ENCOUNTER — Other Ambulatory Visit: Payer: Self-pay | Admitting: Family Medicine

## 2011-01-18 DIAGNOSIS — E7800 Pure hypercholesterolemia, unspecified: Secondary | ICD-10-CM

## 2011-01-18 NOTE — Telephone Encounter (Signed)
Patient needs appointment to address refill for med last filled in April 2011.  I agree with triage nurse advice to be seen in ED. [name] O

## 2011-01-18 NOTE — Telephone Encounter (Signed)
I called  patient 's home and was told  by his wife  that he is lying down because his BP was 240/180 when they sent it over the phone to [place]. They told him to lay down on left  side for an hour . Advised wife that he needs to go to ED now. Advised her to call 911 and  have him go to hospital. She voices understanding.

## 2011-01-18 NOTE — Telephone Encounter (Signed)
Received electronic refill request for creator and lisinopril. I refilled Crestor but lisinopril is not on med list.  Will send message to preceptor to please advise about this refill.   called pharmacy and was told lisinopril was prescribed by Dr. Constance Goltz 08/2009 with #30 and 1 refill. He got the rx filled in Feb and April of 2011. Now he is requesting refill on this.

## 2011-01-18 NOTE — Telephone Encounter (Signed)
See note. [name]

## 2011-01-19 NOTE — Telephone Encounter (Signed)
Called patient and he states he did not go to ED yesterday as advised. States BP came down. This AM BP 136/91 he states. States he is basically getting his care from [place] . Advised that we had received refill request and if he wants RX filled thru our office he will need to come in for appointment.  Offered appointment today but he cannot come . Appointment scheduled tomorrow.    advised  To bring all meds to visit tomorrow.

## 2011-01-20 ENCOUNTER — Ambulatory Visit: Payer: Medicare Other | Admitting: Family Medicine

## 2011-01-26 ENCOUNTER — Ambulatory Visit: Payer: Medicare Other | Admitting: Family Medicine

## 2011-03-28 ENCOUNTER — Encounter: Payer: Self-pay | Admitting: Family Medicine

## 2011-03-28 ENCOUNTER — Ambulatory Visit (INDEPENDENT_AMBULATORY_CARE_PROVIDER_SITE_OTHER): Payer: Medicare Other | Admitting: Family Medicine

## 2011-03-28 VITALS — BP 201/132 | HR 109 | Temp 97.8°F | Wt 290.0 lb

## 2011-03-28 DIAGNOSIS — M545 Low back pain, unspecified: Secondary | ICD-10-CM | POA: Insufficient documentation

## 2011-03-28 DIAGNOSIS — M549 Dorsalgia, unspecified: Secondary | ICD-10-CM

## 2011-03-28 DIAGNOSIS — B1710 Acute hepatitis C without hepatic coma: Secondary | ICD-10-CM

## 2011-03-28 DIAGNOSIS — R05 Cough: Secondary | ICD-10-CM | POA: Insufficient documentation

## 2011-03-28 DIAGNOSIS — R059 Cough, unspecified: Secondary | ICD-10-CM | POA: Insufficient documentation

## 2011-03-28 DIAGNOSIS — I10 Essential (primary) hypertension: Secondary | ICD-10-CM

## 2011-03-28 DIAGNOSIS — B1920 Unspecified viral hepatitis C without hepatic coma: Secondary | ICD-10-CM

## 2011-03-28 LAB — CBC
MCV: 75.2 fL — ABNORMAL LOW (ref 78.0–100.0)
Platelets: 375 10*3/uL (ref 150–400)
RBC: 5.37 MIL/uL (ref 4.22–5.81)
RDW: 17.9 % — ABNORMAL HIGH (ref 11.5–15.5)
WBC: 15.2 10*3/uL — ABNORMAL HIGH (ref 4.0–10.5)

## 2011-03-28 LAB — COMPREHENSIVE METABOLIC PANEL
ALT: 30 U/L (ref 0–53)
AST: 30 U/L (ref 0–37)
Albumin: 3.9 g/dL (ref 3.5–5.2)
CO2: 25 mEq/L (ref 19–32)
Calcium: 9.5 mg/dL (ref 8.4–10.5)
Chloride: 104 mEq/L (ref 96–112)
Potassium: 4.5 mEq/L (ref 3.5–5.3)
Sodium: 139 mEq/L (ref 135–145)
Total Protein: 7 g/dL (ref 6.0–8.3)

## 2011-03-28 LAB — TSH: TSH: 2.901 u[IU]/mL (ref 0.350–4.500)

## 2011-03-28 MED ORDER — LISINOPRIL-HYDROCHLOROTHIAZIDE 20-12.5 MG PO TABS: 1.0000 | ORAL_TABLET | Freq: Every day | ORAL | Status: DC

## 2011-03-28 NOTE — Patient Instructions (Signed)
For your cough, I think it may be from allergies as well. You can take over the counter Zyrtec and see if that helps. If it continues to get worse, I would like to get a chest X-Ray to look at your lungs.  Your blood pressure was too high today. I am checking some labs since it has been a while since we did those and I am starting you on a new medication for your blood pressure. Since it is so high, I would like for you to come back at the end of this week to be seen again to make sure it has improved.  It was nice to meet you!  Elmarie Devlin M. Brason Berthelot, M.D.

## 2011-03-28 NOTE — Assessment & Plan Note (Addendum)
Chronic. States he's going to see his back specialist next week. I am deferring his pain treatment and further evaluation to them.

## 2011-03-29 LAB — CBC
HCT: 41.8
HCT: 39.3
HCT: 40.6
Hemoglobin: 13.5
MCHC: 34.3
MCV: 79.3
Platelets: 443 — ABNORMAL HIGH
Platelets: 416 — ABNORMAL HIGH
RDW: 16.5 — ABNORMAL HIGH
RDW: 16.8 — ABNORMAL HIGH
WBC: 11.5 — ABNORMAL HIGH
WBC: 12.9 — ABNORMAL HIGH

## 2011-03-29 LAB — PROTIME-INR: INR: 0.9

## 2011-03-29 LAB — BASIC METABOLIC PANEL
BUN: 16
CO2: 28
Calcium: 9
Chloride: 103
Creatinine, Ser: 1.83 — ABNORMAL HIGH
GFR calc non Af Amer: 38 — ABNORMAL LOW
Glucose, Bld: 104 — ABNORMAL HIGH
Potassium: 4.2
Sodium: 141

## 2011-03-29 LAB — DIFFERENTIAL
Basophils Absolute: 0.2 — ABNORMAL HIGH
Lymphocytes Relative: 28
Lymphs Abs: 3.2
Monocytes Absolute: 0.8
Neutro Abs: 7.1

## 2011-03-29 LAB — POCT CARDIAC MARKERS
CKMB, poc: 3.8
Myoglobin, poc: 207
Operator id: 295021
Troponin i, poc: <0.05

## 2011-03-29 LAB — LIPID PANEL
Triglycerides: 141
VLDL: 28

## 2011-03-29 LAB — CARDIAC PANEL(CRET KIN+CKTOT+MB+TROPI)
Total CK: 391 — ABNORMAL HIGH
Troponin I: 0.02

## 2011-03-29 LAB — CK TOTAL AND CKMB (NOT AT ARMC)
Relative Index: 1.1
Total CK: 554 — ABNORMAL HIGH

## 2011-03-29 LAB — COMPREHENSIVE METABOLIC PANEL
AST: 40 — ABNORMAL HIGH
Albumin: 3.7
BUN: 16
Calcium: 9.2
Chloride: 105
Creatinine, Ser: 2.02 — ABNORMAL HIGH
GFR calc Af Amer: 42 — ABNORMAL LOW
GFR calc non Af Amer: 34 — ABNORMAL LOW
Total Bilirubin: 1.1

## 2011-03-29 LAB — D-DIMER, QUANTITATIVE: D-Dimer, Quant: 0.33

## 2011-03-29 LAB — T4, FREE: Free T4: 1.26

## 2011-03-29 LAB — B-NATRIURETIC PEPTIDE (CONVERTED LAB): Pro B Natriuretic peptide (BNP): <30

## 2011-03-31 LAB — POCT I-STAT, CHEM 8
BUN: 16
Calcium, Ion: 1.08 — ABNORMAL LOW
Creatinine, Ser: 2 — ABNORMAL HIGH
TCO2: 24

## 2011-03-31 LAB — POCT CARDIAC MARKERS
CKMB, poc: 4.8
Troponin i, poc: <0.05

## 2011-03-31 NOTE — Assessment & Plan Note (Signed)
Pt thinks his cough is due to allergies. It could be a post-nasal drip cough, and I recommended allergy medicine. I asked him to avoid all OTC cough medications due to his blood pressure. Since he had the one episode of coughing up blood, I offered a CXR today and he said he would like to wait on that. At his visit Friday, I would recommend readdressing this issue and consider a CXR at that time.

## 2011-03-31 NOTE — Assessment & Plan Note (Signed)
BP 201/132 today. I expressed my concern to him, and he agrees that it is too high. We discussed the concern of heart failure, stroke, etc. And he is willing to try a new prescription. I will check labs today and start Lisinopril-HCTZ today. I asked him to come back to the office Friday to discuss labs and recheck his blood pressure.

## 2011-03-31 NOTE — Progress Notes (Signed)
  Subjective:    Patient ID: [name]., male    DOB: 08/06/1950, 60 y.o.   MRN: [id number]  Cough Pertinent negatives include no chest pain, fever, shortness of breath or wheezing.    Pt is here for a follow-up today.   1. Cough- Pt states he has had a cough for one month. It is not persistent; it comes once or twice a day and is productive of yellow sputum. After he coughs up a small amount, his cough resolves. It mostly happens in the mornings. He is not a smoker, but his wife does smoke so he is exposed to secondhand smoke. He states he coughed up blood once 2 weeks ago but he does not think it was "in the sputum" and is not concerned about that. Denies fevers, chills, post-tussive emesis.  2. Back Pain- Pt has had multiple back surgeries in the past. He is followed by a back specialist and has an appointment next week. At this time, I am deferring all pain management and further work-up to his back surgeon but I am happy to continue to follow his progress. 3. Hypertension- Pt's BP extremely elevated today. He states he checks it at home and it has been running high (systolic up to the 200's, but has been as low as 120's) Pt states he has had some changes in his vision and sees "wavy lines" sometimes. He also states he has a headache today.   Review of Systems  Constitutional: Negative for fever, activity change and appetite change.  HENT: Negative for congestion.   Eyes: Positive for visual disturbance.  Respiratory: Positive for cough. Negative for shortness of breath and wheezing.   Cardiovascular: Negative for chest pain, palpitations and leg swelling.  Gastrointestinal: Negative for diarrhea and constipation.  Genitourinary: Negative for difficulty urinating.  Musculoskeletal: Positive for back pain.       Objective:   Physical Exam  Constitutional: He is oriented to person, place, and time. He appears well-developed and well-nourished. No distress.  HENT:  Head:  Normocephalic and atraumatic.  Eyes: Pupils are equal, round, and reactive to light.       Attempted fundoscopic exam. Did see vessels, but unable to do full exam without dilation due to my limited skills. If concern for AV nicking, would recommend a better eye exam.  Neck: Normal range of motion. Neck supple.  Cardiovascular: Regular rhythm.  Tachycardia present.   Pulmonary/Chest: Effort normal and breath sounds normal. He has no wheezes. He has no rales. He exhibits no tenderness.  Abdominal: Soft. There is no tenderness.       Obese  Musculoskeletal:       Lumbar back: He exhibits decreased range of motion, tenderness and bony tenderness.  Neurological: He is alert and oriented to person, place, and time. No cranial nerve deficit.  Skin: Skin is warm and dry. He is not diaphoretic.          Assessment & Plan:

## 2011-04-03 LAB — BASIC METABOLIC PANEL
BUN: 7
GFR calc non Af Amer: 57 — ABNORMAL LOW
Potassium: 4.3
Sodium: 138

## 2011-04-03 LAB — CBC
HCT: 45.2
Hemoglobin: 14.9
Platelets: 347
WBC: 8.9

## 2011-04-13 ENCOUNTER — Encounter: Payer: Self-pay | Admitting: Family Medicine

## 2011-09-04 ENCOUNTER — Telehealth: Payer: Self-pay | Admitting: *Deleted

## 2011-09-04 NOTE — Telephone Encounter (Signed)
Received call from Tammy from [facility]. They provide Neb solution and machine for patient. States she contacted patient to ask how he is doing and he was telling her that he has been having more wheezing and shortness of breath lately. They are requesting an order for over night pulse OX. Will fax order request to MD.    I called patient to  ask about his symptoms and offered appointment tomorrow . He wants to come in Friday.  Appointment scheduled.  Advised to call back if worsens or wants to come in sooner.

## 2011-09-08 ENCOUNTER — Ambulatory Visit: Payer: Medicare Other | Admitting: Family Medicine

## 2011-09-13 ENCOUNTER — Encounter: Payer: Self-pay | Admitting: Family Medicine

## 2011-09-13 NOTE — Progress Notes (Signed)
Received order for [employer] via fax for [name] Returned on 09/13/11. [name] needs an appointment with me soon since he did not keep follow up appointment.  [name] M. Durk Carmen, M.D.

## 2011-09-28 ENCOUNTER — Telehealth: Payer: Self-pay | Admitting: Family Medicine

## 2011-09-28 NOTE — Telephone Encounter (Signed)
Calling about the order for oxygen.  Needs to know the status.

## 2011-09-28 NOTE — Telephone Encounter (Signed)
Spoke with Tammy, informed her that MD was waiting for patient to be seen before forms were filled out. Next appt scheduled for 4/12, Tammy expressed understanding.

## 2011-10-13 ENCOUNTER — Ambulatory Visit: Payer: Medicare Other | Admitting: Family Medicine

## 2011-10-18 ENCOUNTER — Encounter: Payer: Self-pay | Admitting: Family Medicine

## 2011-10-23 ENCOUNTER — Ambulatory Visit
Admission: RE | Admit: 2011-10-23 | Discharge: 2011-10-23 | Disposition: A | Discharge status: Home / Self Care | Payer: Medicare Other | Source: Ambulatory Visit | Attending: Family Medicine | Admitting: Family Medicine

## 2011-10-23 ENCOUNTER — Encounter: Payer: Self-pay | Admitting: Family Medicine

## 2011-10-23 ENCOUNTER — Telehealth: Payer: Self-pay | Admitting: Family Medicine

## 2011-10-23 ENCOUNTER — Ambulatory Visit (INDEPENDENT_AMBULATORY_CARE_PROVIDER_SITE_OTHER): Payer: Medicare Other | Admitting: Family Medicine

## 2011-10-23 VITALS — BP 171/109 | HR 100 | Temp 97.6°F | Ht 67.0 in | Wt 300.0 lb

## 2011-10-23 DIAGNOSIS — R05 Cough: Secondary | ICD-10-CM

## 2011-10-23 DIAGNOSIS — J449 Chronic obstructive pulmonary disease, unspecified: Secondary | ICD-10-CM

## 2011-10-23 MED ORDER — OMEGA-3-ACID ETHYL ESTERS 1 G PO CAPS: 4.0000 g | ORAL_CAPSULE | Freq: Every day | ORAL | Status: DC

## 2011-10-23 MED ORDER — PREDNISONE 50 MG PO TABS: ORAL_TABLET | ORAL | Status: AC

## 2011-10-23 MED ORDER — AZITHROMYCIN 250 MG PO TABS: ORAL_TABLET | ORAL | Status: AC

## 2011-10-23 NOTE — Progress Notes (Signed)
  Subjective:    Patient ID: [name]., male    DOB: 04/03/1951, 61 y.o.   MRN: [id number]  HPI Couhging with phlegm x 2 weeks, worsening.  Felt warm.  No objective fever.  Feels short of breath.  Fatigue.   + runny nose, itchy eyes, ears stopped up.  Was taking zyrtec- did not help.  I have reviewed patient's  PMH, FH, and Social history and Medications as related to this visit.  [facility]- started on Lasix.  Has follow-up next week. Review of Systems See HPI    Objective:   Physical Exam GEN: Alert & Oriented, No acute distress, poor overall health.  Here with his wife. CV:  Regular Rate & Rhythm, no murmur Respiratory:  Normal work of breathing, scattered wheezes Abd:  + BS, soft, no tenderness to palpation Ext: 1+ pitting LE edema        Assessment & Plan:

## 2011-10-23 NOTE — Patient Instructions (Signed)
Flare of COPD  Keep using albuterol every 4-6 hours  Prednisone 50 mg x 5 days  Azithromycin for 5 days  [facility]- your blood pressure is very high

## 2011-10-23 NOTE — Telephone Encounter (Signed)
Wrong number.  Unable to contact patient.  No need to change therapy based on xray

## 2011-10-23 NOTE — Assessment & Plan Note (Signed)
Wheezing + cough with sputum most likely due to COPD exacerbation.  Prednisone + azithromycin.    No evidence of pulmonary edema today- [facility] of Lasix.  Given poor historian ? History of past lung ca- will check cxr today.

## 2011-10-25 ENCOUNTER — Telehealth: Payer: Self-pay | Admitting: *Deleted

## 2011-10-25 NOTE — Telephone Encounter (Signed)
SDA letter was mailed to patient on 10/18/11.  Gaylene Brooks, RN

## 2011-10-25 NOTE — Telephone Encounter (Signed)
Message copied by Jose Persia on Wed Oct 25, 2011  9:13 AM ------      Message from: [place], [place] M      Created: Wed Oct 18, 2011  9:45 AM      Regarding: RE: SDA?       Hi Ludean Duhart!            Yes, please make him SDA. He has DNKA and noncompliance. He is one of the patients I told you about last week, but I forgot to send you their information.            His wife, Zacharia Sowles (DOB 09/26/71) has also had 11 or so DKNA. Last appointment with me was in August, and I still get med refills for her.            I will continue to be their PCP.            Thank you!      Amber            ----- Message -----         From: Gaylene Brooks, RN         Sent: 10/18/2011   9:12 AM           To: Hilarie Fredrickson, MD      Subject: SDA?                                                     This patient has had multiple DNKAs.  Do you want to make them a same day appointment (SDA) patient?   If so, you will continue to be the patient's PCP.  Please let me know if you have any questions.              Thanks.      Lattie Corns

## 2011-11-09 ENCOUNTER — Ambulatory Visit: Payer: Medicare Other | Admitting: Family Medicine

## 2011-11-22 ENCOUNTER — Telehealth: Payer: Self-pay | Admitting: *Deleted

## 2011-11-22 NOTE — Telephone Encounter (Addendum)
Received call form [facility] requesting refill for  Lovazal. Advised message will be sent to MD.

## 2011-11-23 NOTE — Telephone Encounter (Signed)
Patient needs to be seen in clinic by myself, or someone else, prior to refill. Thank you.  [name] M. [name], M.D.

## 2011-11-23 NOTE — Telephone Encounter (Signed)
Pharmacy notified that rx has been denied and he needs appointment.

## 2011-11-30 ENCOUNTER — Emergency Department (HOSPITAL_COMMUNITY): Payer: Medicare Other

## 2011-11-30 ENCOUNTER — Encounter (HOSPITAL_COMMUNITY): Payer: Self-pay | Admitting: *Deleted

## 2011-11-30 ENCOUNTER — Inpatient Hospital Stay (HOSPITAL_COMMUNITY)
Admission: EM | Admit: 2011-11-30 | Discharge: 2011-12-02 | DRG: 871 | Disposition: A | Discharge status: Home / Self Care | Payer: Medicare Other | Attending: Family Medicine | Admitting: Family Medicine

## 2011-11-30 DIAGNOSIS — J189 Pneumonia, unspecified organism: Secondary | ICD-10-CM | POA: Diagnosis present

## 2011-11-30 DIAGNOSIS — M549 Dorsalgia, unspecified: Secondary | ICD-10-CM | POA: Diagnosis present

## 2011-11-30 DIAGNOSIS — A419 Sepsis, unspecified organism: Secondary | ICD-10-CM

## 2011-11-30 DIAGNOSIS — Z79899 Other long term (current) drug therapy: Secondary | ICD-10-CM

## 2011-11-30 DIAGNOSIS — N179 Acute kidney failure, unspecified: Secondary | ICD-10-CM

## 2011-11-30 DIAGNOSIS — Z7982 Long term (current) use of aspirin: Secondary | ICD-10-CM

## 2011-11-30 DIAGNOSIS — Z888 Allergy status to other drugs, medicaments and biological substances status: Secondary | ICD-10-CM

## 2011-11-30 DIAGNOSIS — G40909 Epilepsy, unspecified, not intractable, without status epilepticus: Secondary | ICD-10-CM | POA: Diagnosis present

## 2011-11-30 DIAGNOSIS — M109 Gout, unspecified: Secondary | ICD-10-CM | POA: Diagnosis present

## 2011-11-30 DIAGNOSIS — Z981 Arthrodesis status: Secondary | ICD-10-CM

## 2011-11-30 DIAGNOSIS — I129 Hypertensive chronic kidney disease with stage 1 through stage 4 chronic kidney disease, or unspecified chronic kidney disease: Secondary | ICD-10-CM | POA: Diagnosis present

## 2011-11-30 DIAGNOSIS — J4489 Other specified chronic obstructive pulmonary disease: Secondary | ICD-10-CM | POA: Diagnosis present

## 2011-11-30 DIAGNOSIS — R4182 Altered mental status, unspecified: Secondary | ICD-10-CM | POA: Diagnosis present

## 2011-11-30 DIAGNOSIS — G4733 Obstructive sleep apnea (adult) (pediatric): Secondary | ICD-10-CM | POA: Diagnosis present

## 2011-11-30 DIAGNOSIS — Z23 Encounter for immunization: Secondary | ICD-10-CM

## 2011-11-30 DIAGNOSIS — I10 Essential (primary) hypertension: Secondary | ICD-10-CM

## 2011-11-30 DIAGNOSIS — E7800 Pure hypercholesterolemia, unspecified: Secondary | ICD-10-CM

## 2011-11-30 DIAGNOSIS — B1920 Unspecified viral hepatitis C without hepatic coma: Secondary | ICD-10-CM | POA: Diagnosis present

## 2011-11-30 DIAGNOSIS — D649 Anemia, unspecified: Secondary | ICD-10-CM | POA: Diagnosis present

## 2011-11-30 DIAGNOSIS — E669 Obesity, unspecified: Secondary | ICD-10-CM | POA: Diagnosis present

## 2011-11-30 DIAGNOSIS — K219 Gastro-esophageal reflux disease without esophagitis: Secondary | ICD-10-CM | POA: Diagnosis present

## 2011-11-30 DIAGNOSIS — J441 Chronic obstructive pulmonary disease with (acute) exacerbation: Secondary | ICD-10-CM

## 2011-11-30 DIAGNOSIS — E785 Hyperlipidemia, unspecified: Secondary | ICD-10-CM | POA: Diagnosis present

## 2011-11-30 DIAGNOSIS — R05 Cough: Secondary | ICD-10-CM

## 2011-11-30 DIAGNOSIS — G8929 Other chronic pain: Secondary | ICD-10-CM | POA: Diagnosis present

## 2011-11-30 DIAGNOSIS — J449 Chronic obstructive pulmonary disease, unspecified: Secondary | ICD-10-CM | POA: Diagnosis present

## 2011-11-30 DIAGNOSIS — N189 Chronic kidney disease, unspecified: Secondary | ICD-10-CM | POA: Diagnosis present

## 2011-11-30 DIAGNOSIS — N183 Chronic kidney disease, stage 3 unspecified: Secondary | ICD-10-CM | POA: Diagnosis present

## 2011-11-30 DIAGNOSIS — R059 Cough, unspecified: Secondary | ICD-10-CM

## 2011-11-30 DIAGNOSIS — J159 Unspecified bacterial pneumonia: Secondary | ICD-10-CM

## 2011-11-30 HISTORY — DX: Cardiac murmur, unspecified: R01.1

## 2011-11-30 HISTORY — DX: Acute upper respiratory infection, unspecified: J06.9

## 2011-11-30 HISTORY — DX: Gastro-esophageal reflux disease without esophagitis: K21.9

## 2011-11-30 HISTORY — DX: Chronic obstructive pulmonary disease, unspecified: J44.9

## 2011-11-30 HISTORY — DX: Cardiomyopathy, unspecified: I42.9

## 2011-11-30 HISTORY — DX: Unspecified convulsions: R56.9

## 2011-11-30 HISTORY — DX: Headache: R51

## 2011-11-30 HISTORY — DX: Pneumonia, unspecified organism: J18.9

## 2011-11-30 HISTORY — DX: Inflammatory liver disease, unspecified: K75.9

## 2011-11-30 HISTORY — DX: Sleep apnea, unspecified: G47.30

## 2011-11-30 HISTORY — DX: Shortness of breath: R06.02

## 2011-11-30 LAB — DIFFERENTIAL
Basophils Absolute: 0 10*3/uL (ref 0.0–0.1)
Basophils Relative: 0 % (ref 0–1)
Eosinophils Absolute: 0 10*3/uL (ref 0.0–0.7)
Eosinophils Relative: 0 % (ref 0–5)
Lymphocytes Relative: 4 % — ABNORMAL LOW (ref 12–46)
Lymphs Abs: 1.2 10*3/uL (ref 0.7–4.0)
Monocytes Absolute: 0.6 10*3/uL (ref 0.1–1.0)
Monocytes Relative: 2 % — ABNORMAL LOW (ref 3–12)
Neutro Abs: 28.7 10*3/uL — ABNORMAL HIGH (ref 1.7–7.7)
Neutrophils Relative %: 94 % — ABNORMAL HIGH (ref 43–77)

## 2011-11-30 LAB — URINALYSIS, ROUTINE W REFLEX MICROSCOPIC
Bilirubin Urine: NEGATIVE
Glucose, UA: 100 mg/dL — AB
Hgb urine dipstick: NEGATIVE
Ketones, ur: NEGATIVE mg/dL
Leukocytes, UA: NEGATIVE
Nitrite: NEGATIVE
Protein, ur: 100 mg/dL — AB
Specific Gravity, Urine: 1.019 (ref 1.005–1.030)
Urobilinogen, UA: 1 mg/dL (ref 0.0–1.0)
pH: 5.5 (ref 5.0–8.0)

## 2011-11-30 LAB — URINE MICROSCOPIC-ADD ON

## 2011-11-30 LAB — BASIC METABOLIC PANEL
BUN: 13 mg/dL (ref 6–23)
CO2: 28 mEq/L (ref 19–32)
Calcium: 9.1 mg/dL (ref 8.4–10.5)
Chloride: 99 mEq/L (ref 96–112)
Creatinine, Ser: 1.59 mg/dL — ABNORMAL HIGH (ref 0.50–1.35)
GFR calc Af Amer: 53 mL/min — ABNORMAL LOW (ref >90)
GFR calc non Af Amer: 46 mL/min — ABNORMAL LOW (ref >90)
Glucose, Bld: 138 mg/dL — ABNORMAL HIGH (ref 70–99)
Potassium: 4.6 mEq/L (ref 3.5–5.1)
Sodium: 137 mEq/L (ref 135–145)

## 2011-11-30 LAB — CBC
HCT: 39.3 % (ref 39.0–52.0)
Hemoglobin: 13.2 g/dL (ref 13.0–17.0)
MCH: 25.4 pg — ABNORMAL LOW (ref 26.0–34.0)
MCHC: 33.6 g/dL (ref 30.0–36.0)
MCV: 75.6 fL — ABNORMAL LOW (ref 78.0–100.0)
Platelets: 383 10*3/uL (ref 150–400)
RBC: 5.2 MIL/uL (ref 4.22–5.81)
RDW: 16.5 % — ABNORMAL HIGH (ref 11.5–15.5)
WBC: 30.5 10*3/uL — ABNORMAL HIGH (ref 4.0–10.5)

## 2011-11-30 LAB — CARDIAC PANEL(CRET KIN+CKTOT+MB+TROPI)
CK, MB: 2.6 ng/mL (ref 0.3–4.0)
Total CK: 205 U/L (ref 7–232)
Troponin I: <0.3 ng/mL (ref <0.30)

## 2011-11-30 LAB — GLUCOSE, CAPILLARY

## 2011-11-30 MED ORDER — IPRATROPIUM BROMIDE 0.02 % IN SOLN
0.5000 mg | Freq: Four times a day (QID) | RESPIRATORY_TRACT | Status: DC
  Administered 2011-11-30: 0.5 mg via RESPIRATORY_TRACT
  Filled 2011-11-30: qty 2.5

## 2011-11-30 MED ORDER — ASPIRIN EC 81 MG PO TBEC
81.0000 mg | DELAYED_RELEASE_TABLET | Freq: Every day | ORAL | Status: DC
  Administered 2011-11-30 – 2011-12-01 (×2): 81 mg via ORAL
  Filled 2011-11-30 (×4): qty 1

## 2011-11-30 MED ORDER — HYDROCHLOROTHIAZIDE 12.5 MG PO CAPS
12.5000 mg | ORAL_CAPSULE | Freq: Every day | ORAL | Status: DC
  Administered 2011-11-30: 12.5 mg via ORAL
  Filled 2011-11-30 (×2): qty 1

## 2011-11-30 MED ORDER — PANTOPRAZOLE SODIUM 40 MG PO TBEC
40.0000 mg | DELAYED_RELEASE_TABLET | Freq: Every day | ORAL | Status: DC
  Administered 2011-11-30: 40 mg via ORAL
  Filled 2011-11-30: qty 1

## 2011-11-30 MED ORDER — LISINOPRIL-HYDROCHLOROTHIAZIDE 20-12.5 MG PO TABS: 1.0000 | ORAL_TABLET | Freq: Every day | ORAL | Status: DC

## 2011-11-30 MED ORDER — CARVEDILOL PHOSPHATE ER 80 MG PO CP24
80.0000 mg | ORAL_CAPSULE | Freq: Every day | ORAL | Status: DC
  Administered 2011-11-30 – 2011-12-01 (×2): 80 mg via ORAL
  Filled 2011-11-30 (×3): qty 1

## 2011-11-30 MED ORDER — OXYCODONE HCL 5 MG PO TABS
15.0000 mg | ORAL_TABLET | Freq: Two times a day (BID) | ORAL | Status: DC | PRN
  Administered 2011-11-30 – 2011-12-02 (×4): 15 mg via ORAL
  Filled 2011-11-30 (×4): qty 3

## 2011-11-30 MED ORDER — MORPHINE SULFATE 2 MG/ML IJ SOLN
2.0000 mg | INTRAMUSCULAR | Status: AC
  Administered 2011-11-30: 2 mg via INTRAVENOUS
  Filled 2011-11-30: qty 1

## 2011-11-30 MED ORDER — BIOTENE DRY MOUTH MT LIQD
15.0000 mL | Freq: Two times a day (BID) | OROMUCOSAL | Status: DC
  Administered 2011-11-30 – 2011-12-01 (×3): 15 mL via OROMUCOSAL

## 2011-11-30 MED ORDER — CHLORHEXIDINE GLUCONATE 0.12 % MT SOLN
15.0000 mL | Freq: Two times a day (BID) | OROMUCOSAL | Status: DC
  Filled 2011-11-30 (×2): qty 15

## 2011-11-30 MED ORDER — LEVOFLOXACIN IN D5W 750 MG/150ML IV SOLN
750.0000 mg | INTRAVENOUS | Status: DC
  Administered 2011-11-30: 750 mg via INTRAVENOUS
  Filled 2011-11-30: qty 150

## 2011-11-30 MED ORDER — ALBUTEROL SULFATE (5 MG/ML) 0.5% IN NEBU: 2.5000 mg | INHALATION_SOLUTION | RESPIRATORY_TRACT | Status: DC | PRN

## 2011-11-30 MED ORDER — ALBUTEROL SULFATE (5 MG/ML) 0.5% IN NEBU
2.5000 mg | INHALATION_SOLUTION | Freq: Four times a day (QID) | RESPIRATORY_TRACT | Status: DC
  Administered 2011-11-30: 2.5 mg via RESPIRATORY_TRACT
  Filled 2011-11-30: qty 0.5

## 2011-11-30 MED ORDER — ACETAMINOPHEN 325 MG PO TABS: 650.0000 mg | ORAL_TABLET | Freq: Four times a day (QID) | ORAL | Status: DC | PRN

## 2011-11-30 MED ORDER — PNEUMOCOCCAL VAC POLYVALENT 25 MCG/0.5ML IJ INJ: 0.5000 mL | INJECTION | INTRAMUSCULAR | Status: DC

## 2011-11-30 MED ORDER — ASPIRIN 81 MG PO TABS: 81.0000 mg | ORAL_TABLET | Freq: Every day | ORAL | Status: DC

## 2011-11-30 MED ORDER — OMEPRAZOLE MAGNESIUM 20 MG PO TBEC: 20.0000 mg | DELAYED_RELEASE_TABLET | Freq: Every day | ORAL | Status: DC

## 2011-11-30 MED ORDER — ATORVASTATIN CALCIUM 80 MG PO TABS
80.0000 mg | ORAL_TABLET | Freq: Every day | ORAL | Status: DC
  Administered 2011-11-30 – 2011-12-01 (×2): 80 mg via ORAL
  Filled 2011-11-30 (×3): qty 1

## 2011-11-30 MED ORDER — OXYCODONE HCL 5 MG PO TABS: 10.0000 mg | ORAL_TABLET | Freq: Three times a day (TID) | ORAL | Status: DC | PRN

## 2011-11-30 MED ORDER — GABAPENTIN 300 MG PO CAPS
300.0000 mg | ORAL_CAPSULE | Freq: Three times a day (TID) | ORAL | Status: DC
  Filled 2011-11-30 (×2): qty 1

## 2011-11-30 MED ORDER — LISINOPRIL 20 MG PO TABS
20.0000 mg | ORAL_TABLET | Freq: Every day | ORAL | Status: DC
  Administered 2011-11-30: 20 mg via ORAL
  Filled 2011-11-30 (×2): qty 1

## 2011-11-30 MED ORDER — DEXTROSE 5 % IV SOLN
1.0000 g | Freq: Once | INTRAVENOUS | Status: AC
  Administered 2011-11-30: 1 g via INTRAVENOUS
  Filled 2011-11-30: qty 10

## 2011-11-30 MED ORDER — OMEGA-3-ACID ETHYL ESTERS 1 G PO CAPS
4.0000 g | ORAL_CAPSULE | Freq: Every day | ORAL | Status: DC
  Administered 2011-12-01 – 2011-12-02 (×2): 4 g via ORAL
  Filled 2011-11-30 (×3): qty 4

## 2011-11-30 MED ORDER — HEPARIN SODIUM (PORCINE) 5000 UNIT/ML IJ SOLN
5000.0000 [IU] | Freq: Three times a day (TID) | INTRAMUSCULAR | Status: DC
  Administered 2011-11-30 – 2011-12-02 (×6): 5000 [IU] via SUBCUTANEOUS
  Filled 2011-11-30 (×9): qty 1

## 2011-11-30 MED ORDER — DEXTROSE 5 % IV SOLN
500.0000 mg | Freq: Once | INTRAVENOUS | Status: AC
  Administered 2011-11-30: 500 mg via INTRAVENOUS
  Filled 2011-11-30: qty 500

## 2011-11-30 MED ORDER — ALLOPURINOL 300 MG PO TABS
300.0000 mg | ORAL_TABLET | Freq: Every day | ORAL | Status: DC
Start: 2011-11-30 — End: 2011-12-02
  Administered 2011-11-30 – 2011-12-01 (×2): 300 mg via ORAL
  Filled 2011-11-30 (×3): qty 1

## 2011-11-30 MED ORDER — LIDOCAINE 5 % EX PTCH
1.0000 | MEDICATED_PATCH | CUTANEOUS | Status: DC
  Administered 2011-11-30 – 2011-12-01 (×2): 1 via TRANSDERMAL
  Filled 2011-11-30 (×3): qty 1

## 2011-11-30 MED ORDER — SODIUM CHLORIDE 0.9 % IV SOLN
Freq: Once | INTRAVENOUS | Status: AC
  Administered 2011-11-30: 14:00:00 via INTRAVENOUS

## 2011-11-30 MED ORDER — PNEUMOCOCCAL VAC POLYVALENT 25 MCG/0.5ML IJ INJ
0.5000 mL | INJECTION | INTRAMUSCULAR | Status: AC
  Administered 2011-12-01: 0.5 mL via INTRAMUSCULAR
  Filled 2011-11-30: qty 0.5

## 2011-11-30 MED ORDER — PIPERACILLIN-TAZOBACTAM 3.375 G IVPB
3.3750 g | Freq: Three times a day (TID) | INTRAVENOUS | Status: DC
  Administered 2011-11-30 – 2011-12-01 (×3): 3.375 g via INTRAVENOUS
  Filled 2011-11-30 (×6): qty 50

## 2011-11-30 MED ORDER — LEVETIRACETAM ER 500 MG PO TB24
500.0000 mg | ORAL_TABLET | Freq: Every day | ORAL | Status: DC
  Administered 2011-11-30 – 2011-12-01 (×2): 500 mg via ORAL
  Filled 2011-11-30 (×3): qty 1

## 2011-11-30 NOTE — ED Notes (Signed)
Patient was informed of the need for a urine specimen.  Patient stated that he will give one, but will "be a little while" before he will be able to pass any.

## 2011-11-30 NOTE — Progress Notes (Signed)
ANTIBIOTIC CONSULT NOTE - INITIAL  Pharmacy Consult for zosyn Indication: rule out sepsis  Allergies  Allergen Reactions  . Codeine Other (See Comments)    Pt has this allergy in here but says he's not allergic to anything    Vital Signs: Temp: 99.7 F (37.6 C) (05/30 0942) Temp src: Oral (05/30 0942) BP: 142/81 mmHg (05/30 1108) Pulse Rate: 112  (05/30 1108) Intake/Output from previous day:   Intake/Output from this shift:    Labs:  Basename 11/30/11 0824  WBC 30.5*  HGB 13.2  PLT 383  LABCREA --  CREATININE 1.59*   The CrCl is unknown because both a height and weight (above a minimum accepted value) are required for this calculation. No results found for this basename: VANCOTROUGH:2,VANCOPEAK:2,VANCORANDOM:2,GENTTROUGH:2,GENTPEAK:2,GENTRANDOM:2,TOBRATROUGH:2,TOBRAPEAK:2,TOBRARND:2,AMIKACINPEAK:2,AMIKACINTROU:2,AMIKACIN:2, in the last 72 hours   Microbiology: No results found for this or any previous visit (from the past 720 hour(s)).  Medical History: Past Medical History  Diagnosis Date  . Gout     uric acid-9.7  . Degenerative disc disease   . Coronary atherosclerosis     mild  . Orthostasis   . HTN (hypertension)   . Hyperlipidemia   . Obesity   . S/P lumbar fusion   . History of colonoscopy     Medications:  Azithromycin and Ceftriaxone given in ED  Assessment: 61 year old male being admitted to [facility] for AMS, possible sepsis and pneumonia. Patient started on azithromycin and ceftriaxone in ED, will broaden therapy to levaquin and zosyn for empiric coverage. Low grade temps noted while in the ED, wbc elevated at 30.5. Scr is 1.59 which appears to be around his baseline. No renal dose adjustments are needed for the levaquin and zosyn.  Goal of Therapy:  Resolution of infection  Plan:  Zosyn 3.375 grams IV q 8 hours - infuse over 4 hours Levaquin 750mg  IV q24 hours Continue to follow fever curve, renal function and any culture data taken. Severiano Gilbert 11/30/2011,12:57 PM

## 2011-11-30 NOTE — H&P (Signed)
Seen and examined.  Discussed with Dr. Edmonia James.  Agree with her management.  Briefly, 61 yo male with know COPD presents with two weeks worsening cough, fever and shortness of breath.  Did not seek medical attention earlier because "I was stubborn."  This morning disoriented and family called 911.  Has pneumonia on CXR.  Even more, with fever, tachycardia, leukocytosis and mental status changes, meets criteria for sepsis.    He seems clearer now with O2, first doses of antibiotics and fluid bolus.  Agree with admit to step down and combo of zosyn and levofloxacin. BP OK now as is pulse Ox.  Follow carefully.

## 2011-11-30 NOTE — H&P (Signed)
Family Medicine Teaching [facility] Admission History and Physical  Patient name: [name]. Medical record number: [id number] Date of birth: October 05, 1950 Age: 61 y.o. Gender: male  Primary Care Provider: Rodman Pickle, MD, MD  Chief Complaint: AMS, cough, hemoptysis   History of Present Illness: [name]. is a 61 y.o. year old male presenting with AMS this morning that is now resolved.  Per family at bedside, pt was awake and sitting on bed but not responding to them and was very SOB.  Called EMS and pt brought to ER due to this episode.  Pt has had cough, with occasional hemoptysis, increased sputum, fever off and on x 2 weeks.  Pt has refused to see PCP and [facility] or go to [place] for evaluation.  On arrival to ER pt found to have pneumonia.  Family practice called for admission.   Patient Active Problem List  Diagnoses  . HEPATITIS C  . HYPERCHOLESTEROLEMIA  . OBESITY, MORBID  . ERECTILE DYSFUNCTION, MILD  . HYPERTENSION, BENIGN SYSTEMIC  . COPD  . GASTROESOPHAGEAL REFLUX, NO ESOPHAGITIS  . CHRONIC KIDNEY DISEASE STAGE III (MODERATE)  . CONVULSIONS, SEIZURES, NOS  . APNEA, SLEEP  . Chest pain  . Cough  . Back pain   Past Medical History: Past Medical History  Diagnosis Date  . Gout     uric acid-9.7  . Degenerative disc disease   . Coronary atherosclerosis     mild  . Orthostasis   . HTN (hypertension)   . Hyperlipidemia   . Obesity   . S/P lumbar fusion   . History of colonoscopy     Past Surgical History: Past Surgical History  Procedure Date  . 2d echo 12/22/2004  . Cardiac catheterization 04/02/2005  . Cpap 08/14/2005  . Lumbar disc surgery 07/03/1997    L5-S1; x2  . Mri 10/25/2005    mod to severe biforaminal narrowing L5-S1  . Persantine stress test 01/25/2005  . Pft 09/15/2005    mild to moderate obstructive airway disease  . Sleep study 08/14/2005    mod OSA/hypopnea    Social History: History   Social History  . Marital Status: Married   Spouse Name: N/A    Number of Children: 5  . Years of Education: N/A   Occupational History  . Disability     x2 years b/c of seizure disorder   Social History Main Topics  . Smoking status: Never Smoker   . Smokeless tobacco: Never Used  . Alcohol Use: No  . Drug Use: None  . Sexually Active: None   Family History: Family History  Problem Relation Age of Onset  . Coronary artery disease Mother   . Diabetes    . Hypertension Mother     Allergies: Allergies  Allergen Reactions  . Codeine Other (See Comments)    Pt has this allergy in here but says he's not allergic to anything    Current Outpatient Prescriptions  Medication Sig Dispense Refill  . albuterol (PROVENTIL HFA) 108 (90 BASE) MCG/ACT inhaler Inhale 1-2 puffs into the lungs every 4 (four) hours as needed.        Marland Kitchen albuterol (VENTOLIN HFA) 108 (90 BASE) MCG/ACT inhaler Inhale 1-2 puffs into the lungs every 4 (four) hours as needed. For cough and wheeze       . allopurinol (ZYLOPRIM) 300 MG tablet Take 300 mg by mouth daily.        Marland Kitchen aspirin 81 MG tablet Take 81 mg by mouth  daily.        . carvedilol (COREG CR) 80 MG 24 hr capsule Take 80 mg by mouth daily.        . CRESTOR 40 MG tablet TAKE 1 TABLET BY MOUTH DAILY FOR CHOLESTEROL CONTROL  34 tablet  0  . gabapentin (NEURONTIN) 300 MG capsule Take 300 mg by mouth 3 (three) times daily. For seizure control       . levETIRAcetam (KEPPRA XR) 500 MG 24 hr tablet Four tablets once a day for seizure control       . lidocaine (LIDODERM) 5 % Place 1 patch onto the skin daily. Apply to shoulder. Remove & Discard patch within 12 hours or as directed by MD       . lisinopril-hydrochlorothiazide (ZESTORETIC) 20-12.5 MG per tablet Take 1 tablet by mouth daily.  30 tablet  11  . omega-3 acid ethyl esters (LOVAZA) 1 G capsule Take 4 capsules (4 g total) by mouth daily with breakfast.  120 capsule  0  . omeprazole (PRILOSEC OTC) 20 MG tablet Take 20 mg by mouth daily.        Marland Kitchen  oxyCODONE (ROXICODONE) 15 MG immediate release tablet        Review Of Systems: Per HPI with the following additions: no vomiting. No nausea. No rash.  No cold symptoms.  No diarrhea.  Otherwise 12 point review of systems was performed and was unremarkable.  Physical Exam: Pulse: 115  Blood Pressure: 142/81 RR: 24   O2: 93 on RA Temp: 100.3 on arrival-  Down to 99.7 at time of H and P  General: alert, cooperative, mild distress, morbidly obese and sweating HEENT: PERRLA, extra ocular movement intact, oropharynx clear, no lesions, neck supple with midline trachea and dry oral mucous membranes, no lymphadenopathy Heart: S1, S2 normal, no murmur, rub or gallop, regular rate and rhythm Lungs: clear to auscultation, no wheezes or rales and increased wob, no retractions, decreased air movements in bases Abdomen: abdomen is soft without significant tenderness, masses, organomegaly or guarding, obese Extremities: extremities normal, atraumatic, no cyanosis ,edema -trace Skin:no rashes Neurology: normal without focal findings, mental status, speech normal, alert and oriented x3, PERLA and strength normal and equal in all 4 ext. , pt attending well during exam and questioning.   Labs and Imaging: Lab Results  Component Value Date/Time   NA 137 11/30/2011  8:24 AM   K 4.6 11/30/2011  8:24 AM   CL 99 11/30/2011  8:24 AM   CO2 28 11/30/2011  8:24 AM   BUN 13 11/30/2011  8:24 AM   CREATININE 1.59* 11/30/2011  8:24 AM   CREATININE 1.63* 03/28/2011  2:34 PM   GLUCOSE 138* 11/30/2011  8:24 AM   Lab Results  Component Value Date   WBC 30.5* 11/30/2011   HGB 13.2 11/30/2011   HCT 39.3 11/30/2011   MCV 75.6* 11/30/2011   PLT 383 11/30/2011   CXR: Interval increase in patchy bilateral airspace opacities, right  greater than left, which are worrisome for bronchopneumonia.  UA: negative    Assessment and Plan: [name]. is a 61 y.o. year old male presenting with pneumonia and  sepsis: 1. Sepsis: Meets sepsis criteria:  Elevated HR, + fever, increased resp rate, elevated WBC to 30, + source of infection-- pneumonia. Will admit to stepdown.  Will give broad antibiotic coverage with levaquin and zosyn.  Pt to receive a 500cc bolus in addition to fluids received in ER.  Will reassess in a  couple of hours and give additional bolus if pt remains dry.  Hopefully tachycardia will resolve with additional IV fluids and control of fever.  SOB- will give q 6 hours albuterol and atrovent with q 2 hour prn of these as needed.  AMS seems improved at this time- will avoid all medications that could be contributing to this. Pt has history of mild CAD- will obtain 1 CE to r/o any cardiac issue contributing to clinical picture. Recheck ekg in am.  2. History of lung cancer: Not present in record from [place] [facility]-  Has had all workup [facility].  [facility].  Need more information from [place] to know how to proceed.   3. hemoptysis  could be 2/2 to penumonia, trauma from coughing, or  could be due to maligancy.   4. HTN- bp stable at this time. Will continue home regimen bp medications.  5. Chronic back pain- On MS contin IR- unsure frequency of dosing at home.  In the setting of AMS this am will not restart at this time.  If pt remains alert and oriented throughout the afternoon and is requesting pain medication, inpatient on call MD can decide at that time if home dose should be restarted. For now will hold sedating medications.  6. H/o seizures: Continue keppra per home regimen.   7. New onset diabetes: [facility]- doesn't have any information regarding this diagnosis.  Not on any medications currently for treatment of diabetes.   8. Sleep apnea: Will order CPAP machine at nighttime.   9. FEN/GI: regular diet  10. Prophylaxis: heparin, ppi  11. Disposition: pending clinical improvement.   Rosely Fernandez S. Edmonia James, MD Family  Medicine Residency Program PGY-3 [phone]

## 2011-11-30 NOTE — Progress Notes (Signed)
Evaluated pateint.  He complains of low back pain at this time.  He received one dose of Morphine 2 mg when he arrived to floor.  He says morphine helped.  At home, he takes Oxycodone IR 15 mg twice a day as needed for back pain.  He says he takes it every other day.  I told patient that I will restart his home pain regimen as long as he stays awake and alert.  He agrees with the plan.  I also told patient that cardiac markers were normal.  He is sitting up watching TV and has no questions for me at this time.  He is able to converse but becomes a little short of breath, so I increased O2 to 3 L.  At home, he is on 2.5 L.  We will continue to monitor patient closely.  If he clinically gets worse (increasing SOB or wheezing), we will start oral steroids.    DE LA CRUZ,Rabiah Goeser

## 2011-11-30 NOTE — Care Management Note (Addendum)
    Page 1 of 1   12/04/2011     4:29:40 PM   CARE MANAGEMENT NOTE 12/04/2011  Patient:  [name], [name]   Account Number:  [id number]  Date Initiated:  11/30/2011  Documentation initiated by:  GRAVES-BIGELOW,BRENDA  Subjective/Objective Assessment:   Pt admitted with ams and cough - PNA.     Action/Plan:   CM will continue ot monitor for disposition needs.   Anticipated DC Date:  12/02/2011   Anticipated DC Plan:  HOME W HOME HEALTH SERVICES      DC Planning Services  CM consult      Choice offered to / List presented to:             Status of service:  Completed, signed off Medicare Important Message given?   (If response is "NO", the following Medicare IM given date fields will be blank) Date Medicare IM given:   Date Additional Medicare IM given:    Discharge Disposition:  HOME/SELF CARE  Per UR Regulation:  Reviewed for med. necessity/level of care/duration of stay  If discussed at Long Length of Stay Meetings, dates discussed:    Comments:  12/04/11 16:28 Letha Cape RN, BSN [phone] patient dc to home over w/e.

## 2011-11-30 NOTE — ED Provider Notes (Signed)
History     CSN: [id number]  Arrival date & time 11/30/11  0801   First MD Initiated Contact with Patient 11/30/11 [phone]      Chief Complaint  Patient presents with  . Shortness of Breath  . Cough    (Consider location/radiation/quality/duration/timing/severity/associated sxs/prior treatment) HPI  The patient presents to the ER with a 2 weeks history of cough that has worsened over the last 3 days. The patient states that he is short of breath as well. The patient denies abd pain, CP, N/V, weakness, dizziness, syncope, headache, or diarrhea. The patient states that he has not been treated for these symptoms. Patient denies taking anything prior to arrival for his symptoms. The patient states that nothing seems to make his symptoms worse.  Past Medical History  Diagnosis Date  . Gout     uric acid-9.7  . Degenerative disc disease   . Coronary atherosclerosis     mild  . Orthostasis   . HTN (hypertension)   . Hyperlipidemia   . Obesity   . S/P lumbar fusion   . History of colonoscopy     Past Surgical History  Procedure Date  . 2d echo 12/22/2004  . Cardiac catheterization 04/02/2005  . Cpap 08/14/2005  . Lumbar disc surgery 07/03/1997    L5-S1; x2  . Mri 10/25/2005    mod to severe biforaminal narrowing L5-S1  . Persantine stress test 01/25/2005  . Pft 09/15/2005    mild to moderate obstructive airway disease  . Sleep study 08/14/2005    mod OSA/hypopnea    Family History  Problem Relation Age of Onset  . Coronary artery disease Mother   . Diabetes    . Hypertension Mother     History  Substance Use Topics  . Smoking status: Never Smoker   . Smokeless tobacco: Never Used  . Alcohol Use: No      Review of Systems All other systems negative except as documented in the HPI. All pertinent positives and negatives as reviewed in the HPI.  Allergies  Codeine  Home Medications   Current Outpatient Rx  Name Route Sig Dispense Refill  . ALBUTEROL SULFATE HFA 108  (90 BASE) MCG/ACT IN AERS Inhalation Inhale 1-2 puffs into the lungs every 4 (four) hours as needed.      . ALBUTEROL SULFATE HFA 108 (90 BASE) MCG/ACT IN AERS Inhalation Inhale 1-2 puffs into the lungs every 4 (four) hours as needed. For cough and wheeze     . ALLOPURINOL 300 MG PO TABS Oral Take 300 mg by mouth daily.      . ASPIRIN 81 MG PO TABS Oral Take 81 mg by mouth daily.      Marland Kitchen CARVEDILOL PHOSPHATE ER 80 MG PO CP24 Oral Take 80 mg by mouth daily.      . CRESTOR 40 MG PO TABS  TAKE 1 TABLET BY MOUTH DAILY FOR CHOLESTEROL CONTROL 34 tablet 0  . GABAPENTIN 300 MG PO CAPS Oral Take 300 mg by mouth 3 (three) times daily. For seizure control     . LEVETIRACETAM ER 500 MG PO TB24  Four tablets once a day for seizure control     . LIDOCAINE 5 % EX PTCH Transdermal Place 1 patch onto the skin daily. Apply to shoulder. Remove & Discard patch within 12 hours or as directed by MD     . LISINOPRIL-HYDROCHLOROTHIAZIDE 20-12.5 MG PO TABS Oral Take 1 tablet by mouth daily. 30 tablet 11  . OMEGA-3-ACID ETHYL  ESTERS 1 G PO CAPS Oral Take 4 capsules (4 g total) by mouth daily with breakfast. 120 capsule 0  . OMEPRAZOLE MAGNESIUM 20 MG PO TBEC Oral Take 20 mg by mouth daily.      . OXYCODONE HCL 15 MG PO TABS        BP 147/98  Pulse 117  Temp(Src) 99.7 F (37.6 C) (Oral)  Resp 20  SpO2 96%  Physical Exam  Nursing note and vitals reviewed. Constitutional: He is oriented to person, place, and time. He appears well-developed and well-nourished. No distress.  HENT:  Head: Normocephalic and atraumatic.  Eyes: Pupils are equal, round, and reactive to light.  Neck: Normal range of motion. Neck supple.  Cardiovascular: Regular rhythm.  Tachycardia present.  Exam reveals no friction rub.   No murmur heard. Pulmonary/Chest: No accessory muscle usage. Tachypnea noted. Not bradypneic. No respiratory distress. He has rhonchi in the right middle field, the right lower field, the left middle field and the left  lower field.  Neurological: He is alert and oriented to person, place, and time.  Skin: Skin is warm. No rash noted. He is diaphoretic.    ED Course  Procedures (including critical care time)  Labs Reviewed  CBC - Abnormal; Notable for the following:    WBC 30.5 (*)    MCV 75.6 (*)    MCH 25.4 (*)    RDW 16.5 (*)    All other components within normal limits  DIFFERENTIAL - Abnormal; Notable for the following:    Neutrophils Relative 94 (*)    Lymphocytes Relative 4 (*)    Monocytes Relative 2 (*)    Neutro Abs 28.7 (*)    All other components within normal limits  BASIC METABOLIC PANEL - Abnormal; Notable for the following:    Glucose, Bld 138 (*)    Creatinine, Ser 1.59 (*)    GFR calc non Af Amer 46 (*)    GFR calc Af Amer 53 (*)    All other components within normal limits  URINALYSIS, ROUTINE W REFLEX MICROSCOPIC - Abnormal; Notable for the following:    Glucose, UA 100 (*)    Protein, ur 100 (*)    All other components within normal limits  URINE MICROSCOPIC-ADD ON - Abnormal; Notable for the following:    Squamous Epithelial / LPF FEW (*)    All other components within normal limits   Dg Chest 2 View  11/30/2011  *RADIOLOGY REPORT*  Clinical Data: Shortness of breath, cough, fever  CHEST - 2 VIEW  Comparison: October 23, 2011  Findings: There are patchy airspace opacities bilaterally, right greater than left.  These have increased compared with the prior study.  The cardiac silhouette, mediastinum, pulmonary vasculature are within normal limits.  A small right pleural effusion is suspected.  IMPRESSION: Interval increase in patchy bilateral airspace opacities, right greater than left, which are worrisome for bronchopneumonia.  Original Report Authenticated By: Brandon Melnick, M.D.    The patient will be admitted to the hospital for further evaluation and care of this fairly significant pneumonia. The patient is a patient of FP and they will be down to admit him to the  hospital.    MDM  MDM Reviewed: nursing note and vitals Interpretation: labs and x-ray Consults: admitting MD            Carlyle Dolly, PA-C 12/01/11 [phone]

## 2011-11-30 NOTE — ED Notes (Signed)
Patient states productive cough and sob with coughing x 2 weeks, patient also reports pain in right lateral chest from recent biopsy for possible lung CA, patient states fevers/chills x 2 weeks

## 2011-12-01 LAB — BASIC METABOLIC PANEL
Calcium: 9.4 mg/dL (ref 8.4–10.5)
Creatinine, Ser: 2.12 mg/dL — ABNORMAL HIGH (ref 0.50–1.35)
GFR calc Af Amer: 37 mL/min — ABNORMAL LOW (ref >90)
GFR calc non Af Amer: 32 mL/min — ABNORMAL LOW (ref >90)
Sodium: 135 mEq/L (ref 135–145)

## 2011-12-01 LAB — CBC
MCH: 25.3 pg — ABNORMAL LOW (ref 26.0–34.0)
MCV: 74.5 fL — ABNORMAL LOW (ref 78.0–100.0)
Platelets: 374 10*3/uL (ref 150–400)
RBC: 4.75 MIL/uL (ref 4.22–5.81)
RDW: 16.5 % — ABNORMAL HIGH (ref 11.5–15.5)

## 2011-12-01 MED ORDER — TRAMADOL HCL 50 MG PO TABS
50.0000 mg | ORAL_TABLET | Freq: Four times a day (QID) | ORAL | Status: DC | PRN
  Administered 2011-12-01 – 2011-12-02 (×3): 50 mg via ORAL
  Filled 2011-12-01 (×4): qty 1

## 2011-12-01 MED ORDER — LOSARTAN POTASSIUM 50 MG PO TABS
100.0000 mg | ORAL_TABLET | Freq: Every day | ORAL | Status: DC
  Administered 2011-12-01: 100 mg via ORAL
  Filled 2011-12-01 (×2): qty 2

## 2011-12-01 MED ORDER — IBUPROFEN 600 MG PO TABS
600.0000 mg | ORAL_TABLET | Freq: Once | ORAL | Status: DC
  Filled 2011-12-01: qty 1

## 2011-12-01 MED ORDER — SODIUM CHLORIDE 0.9 % IV SOLN
INTRAVENOUS | Status: DC
  Administered 2011-12-01: 11:00:00 via INTRAVENOUS

## 2011-12-01 MED ORDER — LEVOFLOXACIN 750 MG PO TABS
750.0000 mg | ORAL_TABLET | Freq: Every day | ORAL | Status: DC
  Administered 2011-12-01: 750 mg via ORAL
  Filled 2011-12-01 (×3): qty 1

## 2011-12-01 MED ORDER — CARVEDILOL 25 MG PO TABS
25.0000 mg | ORAL_TABLET | Freq: Two times a day (BID) | ORAL | Status: DC
  Administered 2011-12-02: 25 mg via ORAL
  Filled 2011-12-01 (×4): qty 1

## 2011-12-01 MED ORDER — CARVEDILOL 25 MG PO TABS
25.0000 mg | ORAL_TABLET | Freq: Two times a day (BID) | ORAL | Status: DC
  Filled 2011-12-01 (×3): qty 1

## 2011-12-01 NOTE — Progress Notes (Signed)
PCP Note:  Mr. Belluomini was admitted on 5/30 for pneumonia, meeting sepsis criteria. He was transferred from stepdown unit to floor today. He states he is feeling much better and feels he is ready to go home.  He has not been seen in the [facility] clinic recently, but states he has been seen by the [facility] for his medical problems. At this time, he has no concerns or questions. He did state his blood pressure has been much better, and overall he is feeling better.  PE:  Gen: sitting up in bed, NAD. Eating dinner HEENT: AT, [place]. MMM Pulm: Good effort. No wheezes noted Cardio: RRR. No murmur appreciated Ext: No edema  A/P: 61 yo M admitted for pneumonia/sepsis - Family Practice Teaching Service is doing an excellent job with Mr. Skanee care. I truly appreciate their hard work and wonderful plan. Mr. Coller is doing much better. - Continue Levaquin, and supplemental O2 overnight as needed. - Continue medications for control of chronic medical problems - I will follow up with Mr. Alpern in the outpatient clinic within the next 1-2 weeks. I have encouraged him to keep his appointment.  Ananda Sitzer M. Kathleena Freeman, M.D. 12/01/2011 6:03 PM

## 2011-12-01 NOTE — Discharge Summary (Signed)
Family Medicine Resident Discharge Summary  Patient ID: [name] [id number] 61 y.o. 10-Dec-1950  Admit date: 11/30/2011  Discharge date and time: 12/02/2011  Admitting Physician: Sanjuana Letters, MD   Admission Diagnoses: Hypercholesteremia [272.0] Hypertension [401.9] cough  Discharge Diagnoses: Sepsis, CAP, CKD, AMS  Admission Condition: poor  Discharged Condition: good  Indication for Admission: Sepsis  Hospital Course: Ronald Roarty. is a 61 y.o. year old male w/ Hep C, HLD, Obesity, chronic back pain, HTN, COPD, GERD, Seizure disorder, who presented with CAP and sepsis:  Sepsis/CAP: Pt initially w/ unstable VS and AMS. Initiall admitted to step down for close monitoring. Pt sent to regular be the followign morning as remained off O2 throughout his first night of admission and was AFVSS. WBC trended down from and initial 30.5 to 15.6. Initially started on Levoquin and Zosyn, but was narrowed to Levoquin the following morning. Pt required O2 only at night which is at pts baseline of 2.5LNC QHS.  Anemia: Pt w/ anemia noted on admission. Iron studies were pending at the time of discharge . See recommendation at end of note for possible colonoscopy.   CKD: Pt w/ CKD w/ baseline Cr of 1.6. Initial Cr bump to 2.12 likely from poor fluid intake. Encouraged po intake.  Cr improved to 1.86 at time of DC.   Consults: none  Significant Diagnostic Studies:  CXR 11/30/11: Interval increase in patchy bilateral airspace opacities, right greater than left, which are worrisome for bronchopneumonia   CBC: on admission showed WBC 30.5 Hgb 13.2  Bmet: Cr 1.59--> 2.12  HgbA1c: 6.6   Lab Results  Component Value Date   CKTOTAL 205 11/30/2011   CKMB 2.6 11/30/2011   TROPONINI <0.30 11/30/2011   Dg Chest 2 View  11/30/2011  *RADIOLOGY REPORT*  Clinical Data: Shortness of breath, cough, fever  CHEST - 2 VIEW  Comparison: October 23, 2011  Findings: There are patchy airspace  opacities bilaterally, right greater than left.  These have increased compared with the prior study.  The cardiac silhouette, mediastinum, pulmonary vasculature are within normal limits.  A small right pleural effusion is suspected.  IMPRESSION: Interval increase in patchy bilateral airspace opacities, right greater than left, which are worrisome for bronchopneumonia.  Original Report Authenticated By: Brandon Melnick, M.D.   Discharge Exam: BP 143/78  Pulse 93  Temp(Src) 98.3 F (36.8 C) (Oral)  Resp 18  Ht 5\' 7"  (1.702 m)  Wt 289 lb 3.9 oz (131.2 kg)  BMI 45.30 kg/m2  SpO2 95% PHYSICAL EXAM Gen; NAD, ready to leave HEENT: MMM CV: RRR, no murmur Pulm: clear bilaterally, some decreased air movement, no wheezes, rhonchi, or crackles Abd: soft, NT Ext: warm, no edema  Disposition: home  Patient Instructions:  Medication List  As of 12/02/2011  7:19 AM   STOP taking these medications         hydrochlorothiazide 25 MG tablet         TAKE these medications         allopurinol 300 MG tablet   Commonly known as: ZYLOPRIM   Take 300 mg by mouth daily.      amLODipine 10 MG tablet   Commonly known as: NORVASC   Take 10 mg by mouth daily.      aspirin 81 MG tablet   Take 81 mg by mouth daily.      atorvastatin 40 MG tablet   Commonly known as: LIPITOR   Take 40 mg by mouth daily.  carvedilol 25 MG tablet   Commonly known as: COREG   Take 25 mg by mouth 2 (two) times daily with a meal.      Cholecalciferol 2000 UNITS Tbdp   Take 1 capsule by mouth daily.      cyclobenzaprine 10 MG tablet   Commonly known as: FLEXERIL   Take 10 mg by mouth 3 (three) times daily as needed. For muscle spasms      diazepam 10 MG tablet   Commonly known as: VALIUM   Take 10 mg by mouth every 8 (eight) hours as needed. For spasms      furosemide 40 MG tablet   Commonly known as: LASIX   Take 40 mg by mouth daily.      gabapentin 300 MG capsule   Commonly known as: NEURONTIN   Take  300 mg by mouth 3 (three) times daily. For seizure control      HYDROcodone-acetaminophen 5-500 MG per tablet   Commonly known as: VICODIN   Take 1 tablet by mouth 2 (two) times daily as needed. For pain  Taken with oxycodone        ibuprofen 200 MG tablet   Commonly known as: ADVIL,MOTRIN   Take 600 mg by mouth every 6 (six) hours as needed. For pain      levETIRAcetam 500 MG tablet   Commonly known as: KEPPRA   Take 1,000 mg by mouth 3 (three) times daily.      levofloxacin 750 MG tablet   Commonly known as: LEVAQUIN   Take 1 tablet (750 mg total) by mouth daily.      losartan 100 MG tablet   Commonly known as: COZAAR   Take 100 mg by mouth daily.      minoxidil 2.5 MG tablet   Commonly known as: LONITEN   Take 2.5 mg by mouth daily.      omega-3 acid ethyl esters 1 G capsule   Commonly known as: LOVAZA   Take 4 g by mouth daily.      omeprazole 20 MG tablet   Commonly known as: PRILOSEC OTC   Take 20 mg by mouth daily.      oxyCODONE 15 MG immediate release tablet   Commonly known as: ROXICODONE   Take 15 mg by mouth 2 (two) times daily as needed. For pain      Tamsulosin HCl 0.4 MG Caps   Commonly known as: FLOMAX   Take 0.4 mg by mouth daily.      VENTOLIN HFA 108 (90 BASE) MCG/ACT inhaler   Generic drug: albuterol   Inhale 1-2 puffs into the lungs every 4 (four) hours as needed. For cough and wheeze            Follow-up Items:  1. GI workup for anemia. Consider colonoscopy given age and possible GI losses.  2. BP control w/o HCTZ or lisinopril due to recent gout flare 3. Continued Levoquin for pneumonia  Signed: Shelly Flatten, MD Family Medicine Resident PGY-1  Demetria Pore, MD 12/02/2011, 7:19 AM

## 2011-12-01 NOTE — Progress Notes (Signed)
RN notfied Dr. Konrad Dolores at 1000 about pt being unable to urinate. Pt's wife states that he takes Lasix twice a day at home. No new orders at this time. Will continue to monitor. Called report to RN [phone]. Pt transferred 5511 via wheelchair with belongings and wife.    Lorette Ang

## 2011-12-01 NOTE — Progress Notes (Signed)
12/01/11  1100 Pt. transferred to 5511-2 from 3300 via w/c; alert and oriented; no problems noted; wife with pt.; no skin problems.   Leandrew Koyanagi Allyce Bochicchio,RN

## 2011-12-01 NOTE — Progress Notes (Signed)
12/01/11  1330 Paged Dr. And RTC from Dr. Renford Dills. wants to talk with Dr., stated a Dr. will see pt.; asked about IV fluids-in report told IV to be NSL and still has order for IV fluids-Dr. stated it will be addressed; pt. drinking fluids.  Leandrew Koyanagi Hanley Woerner,RN

## 2011-12-01 NOTE — Progress Notes (Signed)
Seen and examined.  Patient looks remarkably well considering 24 hours from admit for sepsis.  I am concerned with pneumonia in COPD, bump in creat and still high WBC.  Despite him protesting to go home, will observe again overnight.

## 2011-12-01 NOTE — ED Provider Notes (Signed)
Medical screening examination/treatment/procedure(s) were performed by non-physician practitioner and as supervising physician I was immediately available for consultation/collaboration.  Ethelda Chick, MD 12/01/11 [phone]

## 2011-12-01 NOTE — Progress Notes (Signed)
[facility] Progress Note  Patient name: Jujuan Dugo. Medical record number: [id number] Date of birth: [birth year] Age: 61 y.o. Gender: male    LOS: 1 day   Primary Care Provider: Rodman Pickle, MD, MD  Overnight Events:  NAEO. On RA this am. Requesting to go home. States he feels back to his normal self. Eating, drinking and voiding w/o difficulty.   Objective: Vital signs in last 24 hours: Temp:  [97.5 F (36.4 C)-100.3 F (37.9 C)] 97.5 F (36.4 C) (05/31 0726) Pulse Rate:  [74-119] 84  (05/31 0726) Resp:  [14-25] 16  (05/31 0726) BP: (100-179)/(59-98) 123/83 mmHg (05/31 0726) SpO2:  [90 %-98 %] 98 % (05/31 0726) Weight:  [289 lb 3.9 oz (131.2 kg)] 289 lb 3.9 oz (131.2 kg) (05/30 1403)  Wt Readings from Last 3 Encounters:  11/30/11 289 lb 3.9 oz (131.2 kg)  10/23/11 300 lb (136.079 kg)  03/28/11 290 lb (131.543 kg)     Current Facility-Administered Medications  Medication Dose Route Frequency Provider Last Rate Last Dose  . 0.9 %  sodium chloride infusion   Intravenous Once Kristen Cardinal, MD 500 mL/hr at 11/30/11 1413    . allopurinol (ZYLOPRIM) tablet 300 mg  300 mg Oral Daily Kristen Cardinal, MD   300 mg at 11/30/11 1451  . antiseptic oral rinse (BIOTENE) solution 15 mL  15 mL Mouth Rinse q12n4p Sanjuana Letters, MD   15 mL at 11/30/11 1934  . aspirin EC tablet 81 mg  81 mg Oral Daily Sanjuana Letters, MD   81 mg at 11/30/11 1623  . atorvastatin (LIPITOR) tablet 80 mg  80 mg Oral q1800 Kristen Cardinal, MD   80 mg at 11/30/11 1627  . azithromycin (ZITHROMAX) 500 mg in dextrose 5 % 250 mL IVPB  500 mg Intravenous Once Jamesetta Orleans Lawyer, PA-C   500 mg at 11/30/11 1041  . carvedilol (COREG CR) 24 hr capsule 80 mg  80 mg Oral Q breakfast Kristen Cardinal, MD   80 mg at 12/01/11 4098  . cefTRIAXone (ROCEPHIN) 1 g in dextrose 5 % 50 mL IVPB  1 g Intravenous Once Jamesetta Orleans Lawyer, PA-C   1 g at 11/30/11 1006  . heparin injection  5,000 Units  5,000 Units Subcutaneous Q8H Kristen Cardinal, MD   5,000 Units at 12/01/11 1191  . hydrochlorothiazide (MICROZIDE) capsule 12.5 mg  12.5 mg Oral Daily Sanjuana Letters, MD   12.5 mg at 11/30/11 1452  . ibuprofen (ADVIL,MOTRIN) tablet 600 mg  600 mg Oral Once Ozella Rocks, MD      . levETIRAcetam (KEPPRA XR) 24 hr tablet 500 mg  500 mg Oral Daily Kristen Cardinal, MD   500 mg at 11/30/11 1625  . lidocaine (LIDODERM) 5 % 1 patch  1 patch Transdermal Q24H Kristen Cardinal, MD   1 patch at 11/30/11 1453  . lisinopril (PRINIVIL,ZESTRIL) tablet 20 mg  20 mg Oral Daily Sanjuana Letters, MD   20 mg at 11/30/11 1452  . morphine 2 MG/ML injection 2 mg  2 mg Intravenous NOW Barnabas Lister, MD   2 mg at 11/30/11 1427  . omega-3 acid ethyl esters (LOVAZA) capsule 4 g  4 g Oral Q breakfast Kristen Cardinal, MD   4 g at 12/01/11 4782  . oxyCODONE (Oxy IR/ROXICODONE) immediate release tablet 15 mg  15 mg Oral BID PRN Ozella Rocks, MD   15 mg  at 12/01/11 0336  . piperacillin-tazobactam (ZOSYN) IVPB 3.375 g  3.375 g Intravenous Q8H Severiano Gilbert, PHARMD   3.375 g at 12/01/11 0865  . pneumococcal 23 valent vaccine (PNU-IMMUNE) injection 0.5 mL  0.5 mL Intramuscular Tomorrow-1000 Sanjuana Letters, MD      . DISCONTD: acetaminophen (TYLENOL) tablet 650 mg  650 mg Oral Q6H PRN Ozella Rocks, MD      . DISCONTD: albuterol (PROVENTIL) (5 MG/ML) 0.5% nebulizer solution 2.5 mg  2.5 mg Nebulization Q6H Kristen Cardinal, MD   2.5 mg at 11/30/11 1428  . DISCONTD: albuterol (PROVENTIL) (5 MG/ML) 0.5% nebulizer solution 2.5 mg  2.5 mg Nebulization Q2H PRN Kristen Cardinal, MD      . DISCONTD: aspirin tablet 81 mg  81 mg Oral Daily Kristen Cardinal, MD      . DISCONTD: chlorhexidine (PERIDEX) 0.12 % solution 15 mL  15 mL Mouth Rinse BID Sanjuana Letters, MD      . DISCONTD: gabapentin (NEURONTIN) capsule 300 mg  300 mg Oral TID Kristen Cardinal, MD      . DISCONTD: ipratropium (ATROVENT)  nebulizer solution 0.5 mg  0.5 mg Nebulization Q6H Kristen Cardinal, MD   0.5 mg at 11/30/11 1428  . DISCONTD: levofloxacin (LEVAQUIN) IVPB 750 mg  750 mg Intravenous Q24H Kristen Cardinal, MD   750 mg at 11/30/11 1307  . DISCONTD: lisinopril-hydrochlorothiazide (PRINZIDE,ZESTORETIC) 20-12.5 MG per tablet 1 tablet  1 tablet Oral Daily Kristen Cardinal, MD      . DISCONTD: omeprazole (PRILOSEC OTC) EC tablet 20 mg  20 mg Oral Daily Kristen Cardinal, MD      . DISCONTD: oxyCODONE (Oxy IR/ROXICODONE) immediate release tablet 10 mg  10 mg Oral Q8H PRN Ivy Tye Savoy, MD      . DISCONTD: pantoprazole (PROTONIX) EC tablet 40 mg  40 mg Oral Q1200 Sanjuana Letters, MD   40 mg at 11/30/11 1450  . DISCONTD: pneumococcal 23 valent vaccine (PNU-IMMUNE) injection 0.5 mL  0.5 mL Intramuscular Tomorrow-1000 Ethelda Chick, MD         PE: Gen: NAD, obese HEENT:MMM CV: RRR, no m/r/g Res: CTAB, normal effort. Decreased air movement in lung base bilat Ext/Musc: 2+ peripheral pulses Neuro: CN grossly intact  Labs/Studies:  CBC:    Component Value Date/Time   WBC 23.8* 12/01/2011 0730   HGB 12.0* 12/01/2011 0730   HCT 35.4* 12/01/2011 0730   PLT 374 12/01/2011 0730   MCV 74.5* 12/01/2011 0730   NEUTROABS 28.7* 11/30/2011 0824   LYMPHSABS 1.2 11/30/2011 0824   MONOABS 0.6 11/30/2011 0824   EOSABS 0.0 11/30/2011 0824   BASOSABS 0.0 11/30/2011 0824   HgbA1c 6.6  Basic Metabolic Panel:    Component Value Date/Time   NA 135 12/01/2011 0730   K 4.6 12/01/2011 0730   CL 98 12/01/2011 0730   CO2 29 12/01/2011 0730   BUN 21 12/01/2011 0730   CREATININE 2.12* 12/01/2011 0730   CREATININE 1.63* 03/28/2011 1434   GLUCOSE 111* 12/01/2011 0730   CALCIUM 9.4 12/01/2011 0730     Assessment/Plan: Rondale Nies. is a 61 y.o. year old male presenting with pneumonia and sepsis:   1. Sepsis/CAP: Much improved this am AFVSS. BP much improved. On RA overnight and this am. White count improved.  - Transfer out of step  down - will DC Zosyn and continue Levaquin today.  - cont O2 as needed. (Pt claims nightime O2 requirement  of 2L [place])  2. Questionable History of lung cancer: Clarified w/ pt this am. Has had workup done at [place] in [place]. Most recent appt 3wks ago and was w/o Ca. Has never had lung cancer. Denies ever using tobacco.  4. HTN- bp stable at this time. Will make changes to regimen as pt w/ goutty flare last week - CHange Coreg 24hr to immediate release BID (home dose) - Change Lisinopril to losartan (home dose) as pt w/ Cr bump - Will DC HCTZ due to gout hx - Monitor closely  5. Chronic back pain- AMS resolved and place on home dose of oxy - Holding tylenol due to hepatitis and ibuprofen due to Cr bumb - Tramadol 50 Q6PRN  6. H/o seizures:  - Continue keppra per home regimen.   7. New onset diabetes:  [facility]- doesn't have any information regarding this diagnosis. Not on any medications currently for treatment of diabetes.   8. Sleep apnea:  Uses 2LNC at home at night or CPAP machine at nighttime.  - Continue O2 PRN and QHS  9. CKD: Cr elevated today to 2.12. Decreased PO and no IVF since admission - IVF NS at 123ml/hr until PO returns  10. Microcytic anemia: iron deficiency vs chronic anemia vs b-thal. No complaint of blood loss.  - iron pnl - discuss outpt colonoscopy  10. FEN/GI: regular diet.   11. Prophylaxis: heparin, ppi   12. Disposition: pending clinical improvement.   LOS 1  Signed: Shelly Flatten, MD Family Medicine Resident PGY-1 [phone] 12/01/2011 8:10 AM

## 2011-12-02 LAB — CBC
HCT: 36.2 % — ABNORMAL LOW (ref 39.0–52.0)
Hemoglobin: 12.2 g/dL — ABNORMAL LOW (ref 13.0–17.0)
MCV: 74.3 fL — ABNORMAL LOW (ref 78.0–100.0)
Platelets: 377 10*3/uL (ref 150–400)
RBC: 4.87 MIL/uL (ref 4.22–5.81)
WBC: 15.6 10*3/uL — ABNORMAL HIGH (ref 4.0–10.5)

## 2011-12-02 LAB — IRON AND TIBC
Iron: 40 ug/dL — ABNORMAL LOW (ref 42–135)
TIBC: 296 ug/dL (ref 215–435)

## 2011-12-02 LAB — BASIC METABOLIC PANEL
CO2: 29 mEq/L (ref 19–32)
Calcium: 9.3 mg/dL (ref 8.4–10.5)
Chloride: 97 mEq/L (ref 96–112)
Glucose, Bld: 128 mg/dL — ABNORMAL HIGH (ref 70–99)
Potassium: 4.1 mEq/L (ref 3.5–5.1)
Sodium: 135 mEq/L (ref 135–145)

## 2011-12-02 LAB — RETICULOCYTES: Retic Count, Absolute: 58.4 10*3/uL (ref 19.0–186.0)

## 2011-12-02 MED ORDER — LEVOFLOXACIN 750 MG PO TABS: 750.0000 mg | ORAL_TABLET | Freq: Every day | ORAL | Status: AC

## 2011-12-02 NOTE — Discharge Summary (Signed)
Seen and examined.  Feels markedly improved since admit.  Agree with DC today as outlined by Dr. Fara Boros

## 2011-12-02 NOTE — Progress Notes (Signed)
[name]. discharged Home per MD order.  Discharge instructions reviewed and discussed with the patient, all questions and concerns answered. Copy of instructions provided and pt to pick up prescription meds  from [facility] (see AVS).  [name].  Home Medication Instructions ZOX:[id number]   Printed on:12/02/11 0841  Medication Information                    allopurinol (ZYLOPRIM) 300 MG tablet Take 300 mg by mouth daily.             aspirin 81 MG tablet Take 81 mg by mouth daily.             omeprazole (PRILOSEC OTC) 20 MG tablet Take 20 mg by mouth daily.             gabapentin (NEURONTIN) 300 MG capsule Take 300 mg by mouth 3 (three) times daily. For seizure control            albuterol (VENTOLIN HFA) 108 (90 BASE) MCG/ACT inhaler Inhale 1-2 puffs into the lungs every 4 (four) hours as needed. For cough and wheeze            oxyCODONE (ROXICODONE) 15 MG immediate release tablet Take 15 mg by mouth 2 (two) times daily as needed. For pain           HYDROcodone-acetaminophen (VICODIN) 5-500 MG per tablet Take 1 tablet by mouth 2 (two) times daily as needed. For pain  Taken with oxycodone            cyclobenzaprine (FLEXERIL) 10 MG tablet Take 10 mg by mouth 3 (three) times daily as needed. For muscle spasms           diazepam (VALIUM) 10 MG tablet Take 10 mg by mouth every 8 (eight) hours as needed. For spasms           furosemide (LASIX) 40 MG tablet Take 40 mg by mouth daily.           Tamsulosin HCl (FLOMAX) 0.4 MG CAPS Take 0.4 mg by mouth daily.           losartan (COZAAR) 100 MG tablet Take 100 mg by mouth daily.           amLODipine (NORVASC) 10 MG tablet Take 10 mg by mouth daily.           carvedilol (COREG) 25 MG tablet Take 25 mg by mouth 2 (two) times daily with a meal.           Cholecalciferol 2000 UNITS TBDP Take 1 capsule by mouth daily.           omega-3 acid ethyl esters (LOVAZA) 1 G capsule Take 4 g by mouth daily.             minoxidil (LONITEN) 2.5 MG tablet Take 2.5 mg by mouth daily.           atorvastatin (LIPITOR) 40 MG tablet Take 40 mg by mouth daily.           levETIRAcetam (KEPPRA) 500 MG tablet Take 1,000 mg by mouth 3 (three) times daily.           ibuprofen (ADVIL,MOTRIN) 200 MG tablet Take 600 mg by mouth every 6 (six) hours as needed. For pain           levofloxacin (LEVAQUIN) 750 MG tablet Take 1 tablet (750 mg total) by mouth daily.  Patients skin is clean, dry and intact, no evidence of skin break down. IV site discontinued and catheter remains intact. Site without signs and symptoms of complications. Dressing and pressure applied.  Patient awaiting transport home will continue to monitor and assist as needed.  Julien Nordmann [facility] 12/02/2011 8:41 AM

## 2011-12-02 NOTE — Progress Notes (Signed)
Pt transported to car by [facility] in a wheelchair, no distress noted on discharge. Julien Nordmann [facility]

## 2011-12-15 ENCOUNTER — Ambulatory Visit: Payer: Medicare Other | Admitting: Family Medicine

## 2011-12-28 ENCOUNTER — Emergency Department (HOSPITAL_COMMUNITY): Payer: Medicare Other

## 2011-12-28 ENCOUNTER — Inpatient Hospital Stay (HOSPITAL_COMMUNITY)
Admission: EM | Admit: 2011-12-28 | Discharge: 2012-01-01 | DRG: 069 | Disposition: A | Discharge status: Home Health | Payer: Medicare Other | Attending: Family Medicine | Admitting: Family Medicine

## 2011-12-28 ENCOUNTER — Encounter (HOSPITAL_COMMUNITY): Payer: Self-pay

## 2011-12-28 DIAGNOSIS — J4489 Other specified chronic obstructive pulmonary disease: Secondary | ICD-10-CM | POA: Diagnosis present

## 2011-12-28 DIAGNOSIS — I428 Other cardiomyopathies: Secondary | ICD-10-CM | POA: Diagnosis present

## 2011-12-28 DIAGNOSIS — Z8673 Personal history of transient ischemic attack (TIA), and cerebral infarction without residual deficits: Secondary | ICD-10-CM

## 2011-12-28 DIAGNOSIS — G40909 Epilepsy, unspecified, not intractable, without status epilepticus: Secondary | ICD-10-CM | POA: Diagnosis present

## 2011-12-28 DIAGNOSIS — N189 Chronic kidney disease, unspecified: Secondary | ICD-10-CM | POA: Diagnosis present

## 2011-12-28 DIAGNOSIS — G459 Transient cerebral ischemic attack, unspecified: Principal | ICD-10-CM | POA: Diagnosis present

## 2011-12-28 DIAGNOSIS — R4182 Altered mental status, unspecified: Secondary | ICD-10-CM

## 2011-12-28 DIAGNOSIS — M109 Gout, unspecified: Secondary | ICD-10-CM | POA: Diagnosis present

## 2011-12-28 DIAGNOSIS — N289 Disorder of kidney and ureter, unspecified: Secondary | ICD-10-CM

## 2011-12-28 DIAGNOSIS — E669 Obesity, unspecified: Secondary | ICD-10-CM | POA: Diagnosis present

## 2011-12-28 DIAGNOSIS — E785 Hyperlipidemia, unspecified: Secondary | ICD-10-CM | POA: Diagnosis present

## 2011-12-28 DIAGNOSIS — N179 Acute kidney failure, unspecified: Secondary | ICD-10-CM | POA: Diagnosis present

## 2011-12-28 DIAGNOSIS — I639 Cerebral infarction, unspecified: Secondary | ICD-10-CM

## 2011-12-28 DIAGNOSIS — F0390 Unspecified dementia without behavioral disturbance: Secondary | ICD-10-CM | POA: Diagnosis present

## 2011-12-28 DIAGNOSIS — I129 Hypertensive chronic kidney disease with stage 1 through stage 4 chronic kidney disease, or unspecified chronic kidney disease: Secondary | ICD-10-CM | POA: Diagnosis present

## 2011-12-28 DIAGNOSIS — J449 Chronic obstructive pulmonary disease, unspecified: Secondary | ICD-10-CM | POA: Diagnosis present

## 2011-12-28 HISTORY — DX: Transient cerebral ischemic attack, unspecified: G45.9

## 2011-12-28 LAB — COMPREHENSIVE METABOLIC PANEL
Alkaline Phosphatase: 104 U/L (ref 39–117)
BUN: 38 mg/dL — ABNORMAL HIGH (ref 6–23)
Chloride: 98 mEq/L (ref 96–112)
GFR calc Af Amer: 22 mL/min — ABNORMAL LOW (ref >90)
GFR calc non Af Amer: 19 mL/min — ABNORMAL LOW (ref >90)
Glucose, Bld: 108 mg/dL — ABNORMAL HIGH (ref 70–99)
Potassium: 4.5 mEq/L (ref 3.5–5.1)
Total Bilirubin: 0.3 mg/dL (ref 0.3–1.2)

## 2011-12-28 LAB — CBC
MCHC: 34.9 g/dL (ref 30.0–36.0)
Platelets: 402 10*3/uL — ABNORMAL HIGH (ref 150–400)
RDW: 16.5 % — ABNORMAL HIGH (ref 11.5–15.5)
WBC: 19.3 10*3/uL — ABNORMAL HIGH (ref 4.0–10.5)

## 2011-12-28 LAB — URINE MICROSCOPIC-ADD ON

## 2011-12-28 LAB — CBC WITH DIFFERENTIAL/PLATELET
Basophils Relative: 0 % (ref 0–1)
Eosinophils Relative: 1 % (ref 0–5)
HCT: 35.7 % — ABNORMAL LOW (ref 39.0–52.0)
Hemoglobin: 11.9 g/dL — ABNORMAL LOW (ref 13.0–17.0)
MCHC: 33.3 g/dL (ref 30.0–36.0)
MCV: 74.1 fL — ABNORMAL LOW (ref 78.0–100.0)
Monocytes Absolute: 1.2 10*3/uL — ABNORMAL HIGH (ref 0.1–1.0)
Monocytes Relative: 6 % (ref 3–12)
Neutro Abs: 17.1 10*3/uL — ABNORMAL HIGH (ref 1.7–7.7)

## 2011-12-28 LAB — CREATININE, SERUM
GFR calc Af Amer: 34 mL/min — ABNORMAL LOW (ref >90)
GFR calc non Af Amer: 29 mL/min — ABNORMAL LOW (ref >90)

## 2011-12-28 LAB — URINALYSIS, ROUTINE W REFLEX MICROSCOPIC
Bilirubin Urine: NEGATIVE
Specific Gravity, Urine: 1.019 (ref 1.005–1.030)
Urobilinogen, UA: 1 mg/dL (ref 0.0–1.0)
pH: 6 (ref 5.0–8.0)

## 2011-12-28 LAB — AMMONIA: Ammonia: 15 umol/L (ref 11–60)

## 2011-12-28 MED ORDER — LEVETIRACETAM 500 MG PO TABS
1000.0000 mg | ORAL_TABLET | Freq: Three times a day (TID) | ORAL | Status: DC
  Filled 2011-12-28: qty 2

## 2011-12-28 MED ORDER — SENNOSIDES-DOCUSATE SODIUM 8.6-50 MG PO TABS
1.0000 | ORAL_TABLET | Freq: Every evening | ORAL | Status: DC | PRN
  Filled 2011-12-28: qty 1

## 2011-12-28 MED ORDER — OXYCODONE-ACETAMINOPHEN 5-325 MG PO TABS
2.0000 | ORAL_TABLET | Freq: Once | ORAL | Status: AC
  Administered 2011-12-28: 2 via ORAL
  Filled 2011-12-28: qty 2

## 2011-12-28 MED ORDER — GABAPENTIN 600 MG PO TABS
600.0000 mg | ORAL_TABLET | Freq: Three times a day (TID) | ORAL | Status: DC
  Administered 2011-12-29 – 2012-01-01 (×13): 600 mg via ORAL
  Filled 2011-12-28 (×14): qty 1

## 2011-12-28 MED ORDER — ENOXAPARIN SODIUM 30 MG/0.3ML ~~LOC~~ SOLN
30.0000 mg | SUBCUTANEOUS | Status: DC
  Administered 2011-12-28: 30 mg via SUBCUTANEOUS
  Filled 2011-12-28 (×2): qty 0.3

## 2011-12-28 MED ORDER — ATORVASTATIN CALCIUM 40 MG PO TABS
40.0000 mg | ORAL_TABLET | Freq: Every day | ORAL | Status: DC
  Administered 2011-12-28 – 2012-01-01 (×5): 40 mg via ORAL
  Filled 2011-12-28 (×5): qty 1

## 2011-12-28 MED ORDER — TRAMADOL HCL 50 MG PO TABS
50.0000 mg | ORAL_TABLET | Freq: Four times a day (QID) | ORAL | Status: DC | PRN
  Administered 2011-12-28 – 2012-01-01 (×13): 50 mg via ORAL
  Filled 2011-12-28 (×14): qty 1

## 2011-12-28 MED ORDER — LEVETIRACETAM 750 MG PO TABS: 750.0000 mg | ORAL_TABLET | Freq: Three times a day (TID) | ORAL | Status: DC

## 2011-12-28 MED ORDER — ASPIRIN 81 MG PO CHEW
81.0000 mg | CHEWABLE_TABLET | Freq: Every day | ORAL | Status: DC
  Administered 2011-12-29 – 2012-01-01 (×4): 81 mg via ORAL
  Filled 2011-12-28 (×4): qty 1

## 2011-12-28 MED ORDER — ARIPIPRAZOLE 5 MG PO TABS
5.0000 mg | ORAL_TABLET | Freq: Every day | ORAL | Status: DC
  Administered 2011-12-28 – 2011-12-29 (×2): 5 mg via ORAL
  Filled 2011-12-28 (×3): qty 1

## 2011-12-28 MED ORDER — LEVETIRACETAM 750 MG PO TABS
750.0000 mg | ORAL_TABLET | ORAL | Status: DC
  Administered 2011-12-29 – 2012-01-01 (×8): 750 mg via ORAL
  Filled 2011-12-28 (×9): qty 1

## 2011-12-28 MED ORDER — SODIUM CHLORIDE 0.9 % IV SOLN
INTRAVENOUS | Status: DC
  Administered 2011-12-28 – 2011-12-31 (×2): via INTRAVENOUS

## 2011-12-28 MED ORDER — ALLOPURINOL 300 MG PO TABS
300.0000 mg | ORAL_TABLET | Freq: Every day | ORAL | Status: DC
  Administered 2011-12-29 – 2012-01-01 (×4): 300 mg via ORAL
  Filled 2011-12-28 (×4): qty 1

## 2011-12-28 MED ORDER — LEVETIRACETAM 750 MG PO TABS
1500.0000 mg | ORAL_TABLET | Freq: Every day | ORAL | Status: DC
  Administered 2011-12-28 – 2012-01-01 (×5): 1500 mg via ORAL
  Filled 2011-12-28 (×5): qty 2

## 2011-12-28 MED ORDER — SODIUM CHLORIDE 0.9 % IV SOLN: INTRAVENOUS | Status: DC

## 2011-12-28 MED ORDER — GABAPENTIN 300 MG PO CAPS
300.0000 mg | ORAL_CAPSULE | Freq: Three times a day (TID) | ORAL | Status: DC
  Filled 2011-12-28: qty 1

## 2011-12-28 NOTE — ED Notes (Signed)
Patient arrived from home with general weakness. Patient reports that he normally uses a walker to ambulate and has noticed some intermittent general weakness. Also complaining of left ankle pain from gout flare up. Patient alert and oriented. Reports that he is taking a new medication for seizures. ems reports [id number] pta

## 2011-12-28 NOTE — H&P (Signed)
[name]. is an 61 y.o. male.   PCP:  Rodman Pickle, MD [facility]  Chief Complaint: Disoriented and weak  History obtained from the patient and his wife [name]  HPI:  [name]. is a 61 y.o. year old male with a past medical history significant for TIA, a seizure disorder and COPD on supplemental O2 via  2-4 L  presenting with a 3 day history of being disoriented and weakness. His wife reports that he had waxing and waning episode of confusion where he did not recognize her or their young children.  His wife called EMS this AM when his confusion returned.  He admits to weakness x 3 days mostly on his L side. His weakness is associated with R eye droop which he first noticed 4 days ago and tremor. He denies slurred speech, falls, sick contacts and fever.   Along with weakness he complains of headaches, pain in his L ankle and knee and blurred vision.  He states that his headache is a choric problem which he attributes to missing his gabapentin dose. He states that his ankle pain is secondary to a recent gout flare which is is treating his his baseline allopurinol and by taking ibuprofen which he knows he should not take but takes anyway for the pain. He repots talking about half a bottle in the last few days. He admits to blurred vision x 3 months.   Past Medical History  Diagnosis Date  . Gout     uric acid-9.7  . Degenerative disc disease   . Coronary atherosclerosis     mild  . Orthostasis   . HTN (hypertension)   . Hyperlipidemia   . Obesity   . S/P lumbar fusion   . History of colonoscopy   . Heart murmur   . Cardiomyopathy   . COPD (chronic obstructive pulmonary disease)   . Shortness of breath   . Recurrent upper respiratory infection (URI)   . Pneumonia   . Sleep apnea     uses cpap  . Diabetes mellitus   . Hepatitis     hep c  . GERD (gastroesophageal reflux disease)   . Seizures   . Headache   . TIA (transient ischemic attack)      Past Surgical History  Procedure Date  . 2d echo 12/22/2004  . Cardiac catheterization 04/02/2005  . Cpap 08/14/2005  . Lumbar disc surgery 07/03/1997    L5-S1; x2  . Mri 10/25/2005    mod to severe biforaminal narrowing L5-S1  . Persantine stress test 01/25/2005  . Pft 09/15/2005    mild to moderate obstructive airway disease  . Sleep study 08/14/2005    mod OSA/hypopnea   Family History  Problem Relation Age of Onset  . Coronary artery disease Mother   . Diabetes    . Hypertension Mother    Social History:  reports that he has never smoked. He has never used smokeless tobacco. He reports that he does not drink alcohol or use illicit drugs. History   Social History Narrative   Lives at home w/ wife and 5 children. Works as a Education officer, environmental.     Allergies:  Allergies  Allergen Reactions  . Codeine Itching and Other (See Comments)    Pt has this allergy in here but says he's not allergic to anything   Medications Prior to Admission  Medication Sig Dispense Refill  . albuterol (VENTOLIN HFA) 108 (90 BASE) MCG/ACT inhaler Inhale 1-2  puffs into the lungs every 4 (four) hours as needed. For cough and wheeze      . allopurinol (ZYLOPRIM) 300 MG tablet Take 300 mg by mouth daily.       Marland Kitchen amLODipine (NORVASC) 10 MG tablet Take 10 mg by mouth daily.      . ARIPiprazole (ABILIFY) 10 MG tablet Take 5 mg by mouth daily.      Marland Kitchen aspirin 81 MG tablet Take 81 mg by mouth daily.       Marland Kitchen atorvastatin (LIPITOR) 80 MG tablet Take 40 mg by mouth at bedtime.      . carvedilol (COREG) 25 MG tablet Take 50 mg by mouth 2 (two) times daily with a meal.      . Cholecalciferol 2000 UNITS TBDP Take 1 capsule by mouth daily.      . cyclobenzaprine (FLEXERIL) 10 MG tablet Take 10 mg by mouth 3 (three) times daily as needed. For muscle spasms      . diazepam (VALIUM) 10 MG tablet Take 10 mg by mouth every 8 (eight) hours as needed. For spasms      . furosemide (LASIX) 40 MG tablet Take 40 mg by mouth daily.      Marland Kitchen  gabapentin (NEURONTIN) 300 MG capsule Take 600 mg by mouth 4 (four) times daily.      Marland Kitchen glipiZIDE (GLUCOTROL) 5 MG tablet Take 2.5 mg by mouth 2 (two) times daily before a meal.      . hydrochlorothiazide (HYDRODIURIL) 25 MG tablet Take 25 mg by mouth daily.      Marland Kitchen HYDROcodone-acetaminophen (VICODIN) 5-500 MG per tablet Take 1 tablet by mouth 2 (two) times daily as needed. For pain  Taken with oxycodone      . ibuprofen (ADVIL,MOTRIN) 200 MG tablet Take 600 mg by mouth every 6 (six) hours as needed. For pain      . levETIRAcetam (KEPPRA) 750 MG tablet Take 750-1,500 mg by mouth 3 (three) times daily. Takes 1 tablet (750 mg) in the morning, 1 tablet (750 mg) at noon, and 2 tablets (1500 mg) at bedtime      . lisinopril-hydrochlorothiazide (PRINZIDE,ZESTORETIC) 20-12.5 MG per tablet Take 1 tablet by mouth daily.      Marland Kitchen losartan (COZAAR) 100 MG tablet Take 100 mg by mouth daily.      . minoxidil (LONITEN) 2.5 MG tablet Take 2.5 mg by mouth daily.      Marland Kitchen omega-3 acid ethyl esters (LOVAZA) 1 G capsule Take 4 g by mouth daily.      Marland Kitchen omeprazole (PRILOSEC OTC) 20 MG tablet Take 20 mg by mouth daily.        Marland Kitchen oxycodone (OXY-IR) 5 MG capsule Take 15 mg by mouth every 6 (six) hours as needed.      . Tamsulosin HCl (FLOMAX) 0.4 MG CAPS Take 0.4 mg by mouth daily.      Marland Kitchen zolpidem (AMBIEN) 10 MG tablet Take 5 mg by mouth at bedtime as needed. For insomnia       Pertinent Labs and Studies WBC 20.3 (*) < 15.6 <23.1 <30.5  RBC 4.82   Hemoglobin 11.9 (*)  HCT 35.7 (*)  MCV 74.1 (*)  MCH 24.7 (*)  MCHC 33.3   RDW 16.5 (*)  Platelets 418 (*)  Neutrophils Relative 85 (*)  Neutro Abs 17.1 (*)  Lymphocytes Relative 8 (*)  Lymphs Abs 1.7   Monocytes Relative 6   Monocytes Absolute 1.2 (*)  Eosinophils Relative 1  Eosinophils Absolute 0.2   Basophils Relative 0   Basophils Absolute 0.0    Lab 12/28/11 1135  NA 134*  K 4.5  CL 98  CO2 25  BUN 38*  CREATININE 3.27* < 1.86 < 2.12 < 1.59  LABGLOM --    GLUCOSE 108*  CALCIUM 9.1   UA: large hgb, 100 protein, glucose negative,  negative nitrite and leukocyte esterase U micro: hyaline cast   Dg Chest 2 View  12/28/2011  *RADIOLOGY REPORT*  Clinical Data: Weakness, fatigue, shortness of breath, cough, congestion, central chest pain, history hypertension, diabetes, COPD  CHEST - 2 VIEW  Comparison: 11/30/2011  Findings: Enlargement of cardiac silhouette. Pulmonary vascular congestion. Minimal perihilar infiltrates, improved since previous exam. Prominent epicardial fat pad at right cardiophrenic angle unchanged. Underpenetration on lateral view. No acute osseous findings.  IMPRESSION: Improved pulmonary infiltrates. Enlargement of cardiac silhouette with pulmonary vascular congestion.  Original Report Authenticated By: Lollie Marrow, M.D.   Ct Head Wo Contrast  12/28/2011  *RADIOLOGY REPORT*  Clinical Data: Right-sided weakness and slurred speech.  CT HEAD WITHOUT CONTRAST  Technique:  Contiguous axial images were obtained from the base of the skull through the vertex without contrast.  Comparison: No priors.  Findings: No acute intracranial abnormalities.  Specifically, no definite evidence of acute/subacute cerebral ischemia, no acute intracranial hemorrhage, no focal mass, mass effect, hydrocephalus or abnormal intra or extra-axial fluid collections.  No acute displaced skull fractures.  Visualized paranasal sinuses and mastoids are well pneumatized, with exception of a 8 mm soft tissue attenuation lesion in the right maxillary sinus, likely represent a small mucosal retention cyst or polyp.  IMPRESSION: 1.  No acute intracranial abnormalities. 2.  The appearance of the brain is normal. 3.  Small mucosal retention cyst versus polyp in the right maxillary sinus is incidentally noted.  Original Report Authenticated By: Florencia Reasons, M.D.   Review of Systems  Denies any falls, CP, N/V, abdominal pain, fever Positive: Diarrhea x1 2 days ago,  extremity pain   Blood pressure 163/74, pulse 96, temperature 98.4 F (36.9 C), temperature source Oral, resp. rate 21, height 5\' 7"  (1.702 m), weight 283 lb 15.2 oz (128.8 kg), SpO2 96.00%. Physical Exam  Constitutional: He appears well-developed and well-nourished. No distress.  HENT:  Head: Normocephalic and atraumatic.  Mouth/Throat: Oropharynx is clear and moist.       Wear dentures Edentulous Round white plaques on buccal mucosa and gingiva.   Eyes: Conjunctivae are normal. Pupils are equal, round, and reactive to light. No scleral icterus.  Neck: Normal range of motion. Neck supple. No thyromegaly present.  Cardiovascular: Normal rate, regular rhythm and normal heart sounds.   Pulses:      Radial pulses are 2+ on the right side, and 2+ on the left side.       Posterior tibial pulses are 0 on the right side, and 1+ on the left side.       Cool left lower extremity.   Respiratory: Effort normal and breath sounds normal.  GI: Soft. He exhibits distension. He exhibits no mass. Bowel sounds are decreased. There is no tenderness. There is no rebound and no guarding.  Genitourinary: Penis normal. Circumcised.       Foley catheter in place  Musculoskeletal: Normal range of motion. He exhibits tenderness. He exhibits no edema.       Full ROM of all muscle group. Tender to palpation on R ankle and R knee with associated erythema but no  effusion noted.   Lymphadenopathy:    He has no cervical adenopathy.  Neurological: He is alert. He has normal strength. A cranial nerve deficit is present. No sensory deficit. He displays a negative Romberg sign. GCS eye subscore is 4. GCS verbal subscore is 5. GCS motor subscore is 6.  Reflex Scores:      Patellar reflexes are 0 on the right side and 0 on the left side.      Alert and oriented to person, place, time except for date patient reported the date as 12/14/11. Confused regarding situation. Patient believes that he has been in the hospital x 3  days at [facility]. He does recognize and can name his wife.   Tongue deviation to the left. No other cranial nerve deficits.  Positive asterixis    Skin: Skin is warm, dry and intact.  Psychiatric: He has a normal mood and affect. His speech is normal and behavior is normal.    Assessment/Plan Zavian Slowey. is a 61 y.o. year old male with a past medical history significant for TIA, a seizure disorder and COPD presenting with waxing and waning altered mental status and weakness in the setting of a gout flare.   1. Altered mental status:  A: I suspect a stroke. Patient also has a history of TIA but there is no history of recent convulsions. His BUN is elevated but not high enough to cause uremic encephalopathy. His AMS can be due to intermittent respiratory depression as patient is noted to desat during the physical exam but I would suspect more sedation if this were the case. In addition her take a large amount of opiods which could be attributing to his confusion.  P: -Stroke work-up with MRI of head, bedside swallow evaluation, lipids and A1c. If patient has had a stroke it will be a failure of aspirin and his antiplatelet therapy must be increased.  -UDS and ammonia level. -Continue keppra and gabapentin for seizure prophylaxis.  -check keppra level.  -consider decreasing  gabapentin dose.  -hold antihypertensives pending MRI.  -hold opiods or give as little as possible.   2. Acute on chronic kidney injury:  A: Elevated Cr to 3.27 baseline Cr of 1.5-2. Suspect intrarenal injury from increased ibuprofen use in the setting of gout flare.  P: -hydrate with IVF -hold NSAIDs and ACEi. -f/u Cr in AM.  3. COPD:  A: stable with improved CXR compared to previous one.  P: -supplemental O2 as needed.   4. Elevated WBC: unclear etiology. UA negative, CXR improved, blood cultures pending, no nuchal rigidity, no fever.  P: follow fever curve. Perform LP and start empiric meningitis  coverage if patient has a decline in mental status with fever.   5. Gout: questionable flare. Check uric acid level.  6. FEN/GI: NPO pending bedside swallow evaluation then soft mechanical diet since patient does not have his dentures. Electrolytes WNL.  7. DVT prophylaxis: lovenox.  8. Dispo: pending work-up and continued clinical improvement.   Dessa Phi, MD Family Medicine Teaching Service [phone] Pager  12/28/2011, 11:06 PM

## 2011-12-28 NOTE — ED Notes (Signed)
ZOX:[id number]<AV> Expected date:<BR> Expected time:11:14 AM<BR> Means of arrival:<BR> Comments:<BR> M51 - 60yoM Dizzy, weak

## 2011-12-28 NOTE — ED Notes (Signed)
Patient aware of need for urine specimen. Patient unable to void at this time. Patient given urinal. Encouraged to call for assistance if needed.   

## 2011-12-28 NOTE — ED Provider Notes (Addendum)
History     CSN: [id number]  Arrival date & time 12/28/11  1124   First MD Initiated Contact with Patient 12/28/11 1128      Chief Complaint  Patient presents with  . Weakness    (Consider location/radiation/quality/duration/timing/severity/associated sxs/prior treatment) HPI Comments: PT sent from home via EMS with reports of generalized weakness.  Was recently admitted by [facility] service for CAP and sepsis.  Was treated with levaquin, discharged on 5/31.  Pt sent over today because reportedly pt was weak all over and having difficulty walking.  No family is with pt here in ED.  Pt initially just complains of his chronic back pain and left foot pain related to gout.  He then said that he has had some slurred speech for last 2 days and weakness on his right side for about a week.  Denies any CP or SOB.  +mild headache.  No cough or chest congestion.  No fevers.  No difficulty urinating.  Says that these symptoms have been constant for the last several days.  Patient is a 61 y.o. male presenting with weakness. The history is provided by the patient.  Weakness The primary symptoms include headaches. Primary symptoms do not include dizziness, fever, nausea or vomiting.  The headache is associated with weakness.  Additional symptoms include weakness.    Past Medical History  Diagnosis Date  . Gout     uric acid-9.7  . Degenerative disc disease   . Coronary atherosclerosis     mild  . Orthostasis   . HTN (hypertension)   . Hyperlipidemia   . Obesity   . S/P lumbar fusion   . History of colonoscopy   . Heart murmur   . Cardiomyopathy   . COPD (chronic obstructive pulmonary disease)   . Shortness of breath   . Recurrent upper respiratory infection (URI)   . Pneumonia   . Sleep apnea     uses cpap  . Diabetes mellitus   . Hepatitis     hep c  . GERD (gastroesophageal reflux disease)   . Seizures   . Headache     Past Surgical History  Procedure Date  . 2d echo 12/22/2004  .  Cardiac catheterization 04/02/2005  . Cpap 08/14/2005  . Lumbar disc surgery 07/03/1997    L5-S1; x2  . Mri 10/25/2005    mod to severe biforaminal narrowing L5-S1  . Persantine stress test 01/25/2005  . Pft 09/15/2005    mild to moderate obstructive airway disease  . Sleep study 08/14/2005    mod OSA/hypopnea    Family History  Problem Relation Age of Onset  . Coronary artery disease Mother   . Diabetes    . Hypertension Mother     History  Substance Use Topics  . Smoking status: Never Smoker   . Smokeless tobacco: Never Used  . Alcohol Use: No      Review of Systems  Constitutional: Positive for fatigue. Negative for fever, chills and diaphoresis.  HENT: Negative for congestion, rhinorrhea and sneezing.   Eyes: Negative.   Respiratory: Negative for cough, chest tightness and shortness of breath.   Cardiovascular: Negative for chest pain and leg swelling.  Gastrointestinal: Negative for nausea, vomiting, abdominal pain, diarrhea and blood in stool.  Genitourinary: Negative for frequency, hematuria, flank pain and difficulty urinating.  Musculoskeletal: Positive for back pain and arthralgias.  Skin: Negative for rash and wound.  Neurological: Positive for facial asymmetry, speech difficulty, weakness and headaches. Negative for dizziness and  numbness.  Psychiatric/Behavioral: Negative for confusion.    Allergies  Codeine  Home Medications   Current Outpatient Rx  Name Route Sig Dispense Refill  . ALBUTEROL SULFATE HFA 108 (90 BASE) MCG/ACT IN AERS Inhalation Inhale 1-2 puffs into the lungs every 4 (four) hours as needed. For cough and wheeze    . ALLOPURINOL 300 MG PO TABS Oral Take 300 mg by mouth daily.     Marland Kitchen AMLODIPINE BESYLATE 10 MG PO TABS Oral Take 10 mg by mouth daily.    . ARIPIPRAZOLE 10 MG PO TABS Oral Take 5 mg by mouth daily.    . ASPIRIN 81 MG PO TABS Oral Take 81 mg by mouth daily.     . ATORVASTATIN CALCIUM 80 MG PO TABS Oral Take 40 mg by mouth at  bedtime.    Marland Kitchen CARVEDILOL 25 MG PO TABS Oral Take 25 mg by mouth 2 (two) times daily with a meal.    . CHOLECALCIFEROL 2000 UNITS PO TBDP Oral Take 1 capsule by mouth daily.    . CYCLOBENZAPRINE HCL 10 MG PO TABS Oral Take 10 mg by mouth 3 (three) times daily as needed. For muscle spasms    . DIAZEPAM 10 MG PO TABS Oral Take 10 mg by mouth every 8 (eight) hours as needed. For spasms    . FUROSEMIDE 40 MG PO TABS Oral Take 40 mg by mouth daily.    Marland Kitchen GABAPENTIN 300 MG PO CAPS Oral Take 300 mg by mouth 3 (three) times daily. For seizure control    . GLIPIZIDE 5 MG PO TABS Oral Take 2.5 mg by mouth 2 (two) times daily before a meal.    . HYDROCHLOROTHIAZIDE 25 MG PO TABS Oral Take 25 mg by mouth daily.    Marland Kitchen HYDROCODONE-ACETAMINOPHEN 5-500 MG PO TABS Oral Take 1 tablet by mouth 2 (two) times daily as needed. For pain  Taken with oxycodone    . IBUPROFEN 200 MG PO TABS Oral Take 600 mg by mouth every 6 (six) hours as needed. For pain    . LEVETIRACETAM 500 MG PO TABS Oral Take 1,000 mg by mouth 3 (three) times daily.    Marland Kitchen LISINOPRIL-HYDROCHLOROTHIAZIDE 20-12.5 MG PO TABS Oral Take 1 tablet by mouth daily.    Marland Kitchen LOSARTAN POTASSIUM 100 MG PO TABS Oral Take 100 mg by mouth daily.    Marland Kitchen MINOXIDIL 2.5 MG PO TABS Oral Take 2.5 mg by mouth daily.    . OMEGA-3-ACID ETHYL ESTERS 1 G PO CAPS Oral Take 4 g by mouth daily.    Marland Kitchen OMEPRAZOLE MAGNESIUM 20 MG PO TBEC Oral Take 20 mg by mouth daily.      . OXYCODONE HCL 15 MG PO TABS Oral Take 15 mg by mouth 2 (two) times daily as needed. For pain    . TAMSULOSIN HCL 0.4 MG PO CAPS Oral Take 0.4 mg by mouth daily.    Marland Kitchen ZOLPIDEM TARTRATE 10 MG PO TABS Oral Take 5 mg by mouth at bedtime as needed. For insomnia      BP 149/96  Pulse 94  Temp 97.8 F (36.6 C)  Resp 18  SpO2 94%  Physical Exam  Constitutional: He is oriented to person, place, and time. He appears well-developed and well-nourished.  HENT:  Head: Normocephalic and atraumatic.  Mouth/Throat:  Oropharynx is clear and moist.  Eyes: Pupils are equal, round, and reactive to light.  Neck: Normal range of motion. Neck supple.  Cardiovascular: Normal rate, regular rhythm and  normal heart sounds.   Pulmonary/Chest: Effort normal and breath sounds normal. No respiratory distress. He has no wheezes. He has no rales. He exhibits no tenderness.  Abdominal: Soft. Bowel sounds are normal. There is no tenderness. There is no rebound and no guarding.  Musculoskeletal: Normal range of motion. He exhibits no edema.  Lymphadenopathy:    He has no cervical adenopathy.  Neurological: He is alert and oriented to person, place, and time. No sensory deficit. GCS eye subscore is 4. GCS verbal subscore is 5. GCS motor subscore is 6.       Pt alert, oriented to person, place, year, disoriented to month.  Motor 5/5 lower ext.  4/5 RUE, 5/5LUE, FTN intact.  Slight right facial droop with decreased sensation to LT right face  Skin: Skin is warm and dry. No rash noted.  Psychiatric: He has a normal mood and affect.    ED Course  Procedures (including critical care time)  Results for orders placed during the hospital encounter of 12/28/11  CBC WITH DIFFERENTIAL      Component Value Range   WBC 20.3 (*) 4.0 - 10.5 K/uL   RBC 4.82  4.22 - 5.81 MIL/uL   Hemoglobin 11.9 (*) 13.0 - 17.0 g/dL   HCT 16.1 (*) 09.6 - 04.5 %   MCV 74.1 (*) 78.0 - 100.0 fL   MCH 24.7 (*) 26.0 - 34.0 pg   MCHC 33.3  30.0 - 36.0 g/dL   RDW 40.9 (*) 81.1 - 91.4 %   Platelets 418 (*) 150 - 400 K/uL   Neutrophils Relative 85 (*) 43 - 77 %   Neutro Abs 17.1 (*) 1.7 - 7.7 K/uL   Lymphocytes Relative 8 (*) 12 - 46 %   Lymphs Abs 1.7  0.7 - 4.0 K/uL   Monocytes Relative 6  3 - 12 %   Monocytes Absolute 1.2 (*) 0.1 - 1.0 K/uL   Eosinophils Relative 1  0 - 5 %   Eosinophils Absolute 0.2  0.0 - 0.7 K/uL   Basophils Relative 0  0 - 1 %   Basophils Absolute 0.0  0.0 - 0.1 K/uL  URINALYSIS, ROUTINE W REFLEX MICROSCOPIC      Component  Value Range   Color, Urine YELLOW  YELLOW   APPearance CLEAR  CLEAR   Specific Gravity, Urine 1.019  1.005 - 1.030   pH 6.0  5.0 - 8.0   Glucose, UA NEGATIVE  NEGATIVE mg/dL   Hgb urine dipstick LARGE (*) NEGATIVE   Bilirubin Urine NEGATIVE  NEGATIVE   Ketones, ur NEGATIVE  NEGATIVE mg/dL   Protein, ur 782 (*) NEGATIVE mg/dL   Urobilinogen, UA 1.0  0.0 - 1.0 mg/dL   Nitrite NEGATIVE  NEGATIVE   Leukocytes, UA NEGATIVE  NEGATIVE  COMPREHENSIVE METABOLIC PANEL      Component Value Range   Sodium 134 (*) 135 - 145 mEq/L   Potassium 4.5  3.5 - 5.1 mEq/L   Chloride 98  96 - 112 mEq/L   CO2 25  19 - 32 mEq/L   Glucose, Bld 108 (*) 70 - 99 mg/dL   BUN 38 (*) 6 - 23 mg/dL   Creatinine, Ser 9.56 (*) 0.50 - 1.35 mg/dL   Calcium 9.1  8.4 - 21.3 mg/dL   Total Protein 7.6  6.0 - 8.3 g/dL   Albumin 3.4 (*) 3.5 - 5.2 g/dL   AST 59 (*) 0 - 37 U/L   ALT 27  0 - 53 U/L  Alkaline Phosphatase 104  39 - 117 U/L   Total Bilirubin 0.3  0.3 - 1.2 mg/dL   GFR calc non Af Amer 19 (*) >90 mL/min   GFR calc Af Amer 22 (*) >90 mL/min  URINE MICROSCOPIC-ADD ON      Component Value Range   Squamous Epithelial / LPF RARE  RARE   WBC, UA 0-2  <3 WBC/hpf   RBC / HPF 0-2  <3 RBC/hpf   Bacteria, UA RARE  RARE   Casts HYALINE CASTS (*) NEGATIVE   Dg Chest 2 View  12/28/2011  *RADIOLOGY REPORT*  Clinical Data: Weakness, fatigue, shortness of breath, cough, congestion, central chest pain, history hypertension, diabetes, COPD  CHEST - 2 VIEW  Comparison: 11/30/2011  Findings: Enlargement of cardiac silhouette. Pulmonary vascular congestion. Minimal perihilar infiltrates, improved since previous exam. Prominent epicardial fat pad at right cardiophrenic angle unchanged. Underpenetration on lateral view. No acute osseous findings.  IMPRESSION: Improved pulmonary infiltrates. Enlargement of cardiac silhouette with pulmonary vascular congestion.  Original Report Authenticated By: Lollie Marrow, M.D.   Ct Head Wo  Contrast  12/28/2011  *RADIOLOGY REPORT*  Clinical Data: Right-sided weakness and slurred speech.  CT HEAD WITHOUT CONTRAST  Technique:  Contiguous axial images were obtained from the base of the skull through the vertex without contrast.  Comparison: No priors.  Findings: No acute intracranial abnormalities.  Specifically, no definite evidence of acute/subacute cerebral ischemia, no acute intracranial hemorrhage, no focal mass, mass effect, hydrocephalus or abnormal intra or extra-axial fluid collections.  No acute displaced skull fractures.  Visualized paranasal sinuses and mastoids are well pneumatized, with exception of a 8 mm soft tissue attenuation lesion in the right maxillary sinus, likely represent a small mucosal retention cyst or polyp.  IMPRESSION: 1.  No acute intracranial abnormalities. 2.  The appearance of the brain is normal. 3.  Small mucosal retention cyst versus polyp in the right maxillary sinus is incidentally noted.  Original Report Authenticated By: Florencia Reasons, M.D.       Date: 12/28/2011  Rate: 96  Rhythm: normal sinus rhythm  QRS Axis: normal  Intervals: normal  ST/T Wave abnormalities: nonspecific ST/T changes  Conduction Disutrbances:none  Narrative Interpretation:   Old EKG Reviewed: unchanged    1. CVA (cerebral infarction)   2. Renal insufficiency       MDM  Pt with possible CVA, well out of time frame for thrombolytics.  Renal insufficiency, elevated WBC, but no sourse of infection seen.  Will transfer to [facility] to be admitted to [facility] service        Rolan Bucco, MD 12/28/11 1531  Rolan Bucco, MD 12/28/11 1535

## 2011-12-29 ENCOUNTER — Inpatient Hospital Stay (HOSPITAL_COMMUNITY): Payer: Medicare Other

## 2011-12-29 LAB — BLOOD GAS, ARTERIAL
Bicarbonate: 24.9 mEq/L — ABNORMAL HIGH (ref 20.0–24.0)
Drawn by: 31276
FIO2: 0.28 %
O2 Saturation: 93.8 %
Patient temperature: 98.6
pH, Arterial: 7.452 — ABNORMAL HIGH (ref 7.350–7.450)
pO2, Arterial: 74.2 mmHg — ABNORMAL LOW (ref 80.0–100.0)

## 2011-12-29 LAB — LIPID PANEL
HDL: 39 mg/dL — ABNORMAL LOW (ref >39)
LDL Cholesterol: 111 mg/dL — ABNORMAL HIGH (ref 0–99)
Total CHOL/HDL Ratio: 4.5 RATIO
Triglycerides: 135 mg/dL (ref <150)
VLDL: 27 mg/dL (ref 0–40)

## 2011-12-29 LAB — RAPID URINE DRUG SCREEN, HOSP PERFORMED
Amphetamines: NOT DETECTED
Benzodiazepines: POSITIVE — AB
Cocaine: NOT DETECTED

## 2011-12-29 LAB — PROTIME-INR: Prothrombin Time: 16.1 seconds — ABNORMAL HIGH (ref 11.6–15.2)

## 2011-12-29 LAB — CBC
HCT: 33.2 % — ABNORMAL LOW (ref 39.0–52.0)
Hemoglobin: 11.5 g/dL — ABNORMAL LOW (ref 13.0–17.0)
MCH: 25 pg — ABNORMAL LOW (ref 26.0–34.0)
MCV: 72.2 fL — ABNORMAL LOW (ref 78.0–100.0)
Platelets: 424 10*3/uL — ABNORMAL HIGH (ref 150–400)
RBC: 4.6 MIL/uL (ref 4.22–5.81)

## 2011-12-29 LAB — GLUCOSE, CAPILLARY: Glucose-Capillary: 99 mg/dL (ref 70–99)

## 2011-12-29 LAB — BASIC METABOLIC PANEL
BUN: 30 mg/dL — ABNORMAL HIGH (ref 6–23)
CO2: 23 mEq/L (ref 19–32)
Calcium: 9.6 mg/dL (ref 8.4–10.5)
Chloride: 99 mEq/L (ref 96–112)
Creatinine, Ser: 1.93 mg/dL — ABNORMAL HIGH (ref 0.50–1.35)
Glucose, Bld: 107 mg/dL — ABNORMAL HIGH (ref 70–99)

## 2011-12-29 MED ORDER — ENOXAPARIN SODIUM 40 MG/0.4ML ~~LOC~~ SOLN
40.0000 mg | Freq: Every day | SUBCUTANEOUS | Status: DC
  Administered 2011-12-29 – 2011-12-30 (×2): 40 mg via SUBCUTANEOUS
  Filled 2011-12-29 (×3): qty 0.4

## 2011-12-29 MED ORDER — HALOPERIDOL LACTATE 5 MG/ML IJ SOLN
1.0000 mg | Freq: Once | INTRAMUSCULAR | Status: AC
  Administered 2011-12-29: 1 mg via INTRAVENOUS
  Filled 2011-12-29: qty 0.2

## 2011-12-29 NOTE — Progress Notes (Signed)
Pt moved to camera room and pt 's wife was informed of move  Pt very restless , confused and calling out and trying to get out of bed

## 2011-12-29 NOTE — Progress Notes (Addendum)
Pt yelling loudly for his children. RN easily able to reorient patient to environment. Once patient realizes he is not at home he is A+Ox4. As soon as RN leave room, pt begins to yell at his sons again, even though they are not present. Pt states that they have begun having some disciplinary/legal issues with his son at this time. When wife arrived this evening she confirmed this to be true. Pt also stating he can see the walls of a war prison. Asked to shut the door of bathroom because he was afraid snakes would come through it. When patient reoriented he states he was in the [employer] and served in [place]. When reoriented he says that his "optical illusions" "Look so real."  Wife confirms military service as well, Will continue to monitor patient for safety and reorient as needed.

## 2011-12-29 NOTE — Progress Notes (Signed)
Family Medicine Teaching Service Hospital Progress Note  Patient name: [name]. Medical record number: [id number] Date of birth: May 01, 1951 Age: 61 y.o. Gender: male    LOS: 1 day   Primary Care Provider: Rodman Pickle, MD  Overnight Events:  Confused overnight. Requiring Haldol. On O2 as unable to tolerate CPAP. Tolerating PO w/o concern  Objective: Vital signs in last 24 hours: Temp:  [97.8 F (36.6 C)-100.4 F (38 C)] 99.2 F (37.3 C) (06/28 0600) Pulse Rate:  [93-104] 102  (06/28 0600) Resp:  [18-24] 21  (06/28 0600) BP: (134-164)/(68-135) 157/84 mmHg (06/28 0600) SpO2:  [94 %-100 %] 97 % (06/28 0600) Weight:  [283 lb 15.2 oz (128.8 kg)] 283 lb 15.2 oz (128.8 kg) (06/27 1939)  Wt Readings from Last 3 Encounters:  12/28/11 283 lb 15.2 oz (128.8 kg)  11/30/11 289 lb 3.9 oz (131.2 kg)  10/23/11 300 lb (136.079 kg)     Current Facility-Administered Medications  Medication Dose Route Frequency Provider Last Rate Last Dose  . 0.9 %  sodium chloride infusion   Intravenous Continuous Dessa Phi, MD 100 mL/hr at 12/28/11 2248    . allopurinol (ZYLOPRIM) tablet 300 mg  300 mg Oral Daily Josalyn Funches, MD      . ARIPiprazole (ABILIFY) tablet 5 mg  5 mg Oral Daily Josalyn Funches, MD   5 mg at 12/28/11 2252  . aspirin chewable tablet 81 mg  81 mg Oral Daily Josalyn Funches, MD      . atorvastatin (LIPITOR) tablet 40 mg  40 mg Oral QHS Josalyn Funches, MD   40 mg at 12/28/11 2252  . enoxaparin (LOVENOX) injection 30 mg  30 mg Subcutaneous Q24H Josalyn Funches, MD   30 mg at 12/28/11 2253  . gabapentin (NEURONTIN) tablet 600 mg  600 mg Oral TID AC & HS Josalyn Funches, MD      . haloperidol lactate (HALDOL) injection 1 mg  1 mg Intravenous Once Shelva Majestic, MD   1 mg at 12/29/11 0148  . levETIRAcetam (KEPPRA) tablet 1,500 mg  1,500 mg Oral QHS Josalyn Funches, MD   1,500 mg at 12/28/11 2359  . levETIRAcetam (KEPPRA) tablet 750 mg  750 mg Oral Custom Josalyn  Funches, MD      . oxyCODONE-acetaminophen (PERCOCET) 5-325 MG per tablet 2 tablet  2 tablet Oral Once Rolan Bucco, MD   2 tablet at 12/28/11 1428  . senna-docusate (Senokot-S) tablet 1 tablet  1 tablet Oral QHS PRN Josalyn Funches, MD      . traMADol (ULTRAM) tablet 50 mg  50 mg Oral Q6H PRN Dessa Phi, MD   50 mg at 12/29/11 0609  . DISCONTD: 0.9 %  sodium chloride infusion   Intravenous STAT Rolan Bucco, MD      . DISCONTD: gabapentin (NEURONTIN) capsule 300 mg  300 mg Oral TID Dessa Phi, MD      . DISCONTD: levETIRAcetam (KEPPRA) tablet 1,000 mg  1,000 mg Oral TID Dessa Phi, MD      . DISCONTD: levETIRAcetam (KEPPRA) tablet 750-1,500 mg  750-1,500 mg Oral TID Dessa Phi, MD         PE: Gen: NAD, obese HEENT: MMM CV: RRR Res: normal effort, on RA Ext/Musc: 2+ pulses throughout, no joint tenderness Neuro: CN grossly intact  Labs/Studies:  ABG    Component Value Date/Time   PHART 7.452* 12/29/2011 0230   PCO2ART 36.3 12/29/2011 0230   PO2ART 74.2* 12/29/2011 0230   HCO3 24.9* 12/29/2011 0230  TCO2 26.1 12/29/2011 0230   O2SAT 93.8 12/29/2011 0230    Comprehensive Metabolic Panel:    Component Value Date/Time   NA 135 12/29/2011 0620   K 4.3 12/29/2011 0620   CL 99 12/29/2011 0620   CO2 23 12/29/2011 0620   BUN 30* 12/29/2011 0620   CREATININE 1.93* 12/29/2011 0620   CREATININE 1.63* 03/28/2011 1434   GLUCOSE 107* 12/29/2011 0620   CALCIUM 9.6 12/29/2011 0620   AST 59* 12/28/2011 1135   ALT 27 12/28/2011 1135   ALKPHOS 104 12/28/2011 1135   BILITOT 0.3 12/28/2011 1135   PROT 7.6 12/28/2011 1135   ALBUMIN 3.3* 12/29/2011 0620   Lipid Panel     Component Value Date/Time   CHOL 177 12/29/2011 0500   TRIG 135 12/29/2011 0500   HDL 39* 12/29/2011 0500   CHOLHDL 4.5 12/29/2011 0500   VLDL 27 12/29/2011 0500   LDLCALC 111* 12/29/2011 0500    CBC:    Component Value Date/Time   WBC 19.6* 12/29/2011 0620   HGB 11.5* 12/29/2011 0620   HCT 33.2* 12/29/2011 0620    PLT 424* 12/29/2011 0620   MCV 72.2* 12/29/2011 0620   NEUTROABS 17.1* 12/28/2011 1135   LYMPHSABS 1.7 12/28/2011 1135   MONOABS 1.2* 12/28/2011 1135   EOSABS 0.2 12/28/2011 1135   BASOSABS 0.0 12/28/2011 1135    PT/INR: 16.2/1.26  Assessment/Plan: [name]. is a 61 y.o. year old male with a past medical history significant for TIA, a seizure disorder and COPD presenting with waxing and waning altered mental status and weakness in the setting of a gout flare.   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 Patient has a history of TIA. No chemical or other organic cause of AMS apparent. Possibly due to respiratory depression though ABG from last night showed a low O2. His AMS can be due to intermittent respiratory depression as patient is noted to desat overnight and ABG as above. Sedation w/ benzos is also a possibility. CT unremarkable. UDS positive for benzos.  - MRI today -ammonia level pending  -consider decreasing gabapentin dose.  -hold antihypertensives pending MRI.  -hold opiods for now  Seizure: No evidence of seizure.  - Continue home Keppra - Keppra level pending  Acute on chronic kidney injury: Baseline Cr between 1.5-2. Elevated to 3.27 on admission and 1.93 today. Suspect intrarenal injury from increased ibuprofen use in the setting of possible gout flare. Some improvement w/ fluids  -Continue IVF  -hold NSAIDs and ACEi.  -repeat BMET in AM   COPD: Did not tolerate CPAP las night. Likely contributing to overall mental status change. Stable with improved CXR on admission compared to previous one.  - Ambulate pt w/o O2, and record Sats -supplemental O2 as needed.   Elevated WBC: Unclear etiology. Afebrile since admission. UA negative, CXR improved, blood cultures pending, no nuchal rigidity. - Perform LP and start empiric meningitis coverage if patient has a decline in mental status with fever.   Gout: questionable flare.  - Continue allopurinol  -  uric acid level pending  FEN/GI: NPO pending bedside swallow evaluation then soft mechanical diet since patient does not have his dentures. Electrolytes WNL.   Prophylaxis:  - lovenox.  - Protonix  Dispo: pending work-up and continued clinical improvement.   LOS 1  Signed: Shelly Flatten, MD Family Medicine Resident PGY-1 [phone] 12/29/2011 8:02 AM

## 2011-12-29 NOTE — Progress Notes (Deleted)
Pt calling out  Very loud and tring to get up out of bed   Called his  Wife and informed  Her of bed cfhange and that we also will place pt in the room with camera 3029 with bed alarm

## 2011-12-29 NOTE — Care Management Note (Signed)
    Page 1 of 1   12/29/2011     2:46:24 PM   CARE MANAGEMENT NOTE 12/29/2011  Patient:  [name], [name]   Account Number:  [id number]  Date Initiated:  12/29/2011  Documentation initiated by:  Donn Pierini  Subjective/Objective Assessment:   Pt admitted with AMS r/o CVA     Action/Plan:   PTA pt lived at home with spouse,  PT/OT/ST evals   Anticipated DC Date:  12/31/2011   Anticipated DC Plan:  IP REHAB FACILITY      DC Planning Services  CM consult      Choice offered to / List presented to:             Status of service:  In process, will continue to follow Medicare Important Message given?   (If response is "NO", the following Medicare IM given date fields will be blank) Date Medicare IM given:   Date Additional Medicare IM given:    Discharge Disposition:    Per UR Regulation:  Reviewed for med. necessity/level of care/duration of stay  If discussed at Long Length of Stay Meetings, dates discussed:    Comments:  PCP- Mikel Cella, AMBER M   12/29/11- 1430- Donn Pierini RN, BSN [phone] UR completed, PT/OT recommending CIR- MD Please order CIR consult per PT/OT recommendations if you agree- NCM to continue to follow

## 2011-12-29 NOTE — Progress Notes (Signed)
Utilization review completed.  

## 2011-12-29 NOTE — H&P (Signed)
FMTS Attending Admit Note  Patient seen and examined by me, discussed with resident team and I agree with plan with following addenda:  Briefly, 60yoM who [facility] as well as [facility],  (neurologist for sz d/o), brought to [facility] by wife for acute onset change in mental status.  This morning, Mr. Thong is alert and oriented to person and place [facility]), gives the exact date (29 December 2011), and knows he was transferred here from [facility] "because my wife is delusional".  Denies pain other than gouty pain in L knee and foot, which he reports has been ongoing for 1 week.  Denies fevers or chills. Is able to get up to standing and walk to bedside commode with minimal assist this morning. Denies dyspnea.   Assess/Plan: Patient with what appears to be delirium, but without clear etiology (no new meds; started in home environment).  Has had recent PNA in hospital; of note, his WBC count has never really normalized since then, and now it is 19K.  Would repeat a diff on the CBC done this morning.  I think we need to explore possible causes of this going forward. Also uncertain if there is an element of underlying chronic dementia at play.  Would consider further imaging with MRI to explore possibility of CVA, Lewy body dementia. Paula Compton, MD

## 2011-12-29 NOTE — Progress Notes (Addendum)
S: Called by nursing due to increasing disorientation. Patient thinks he is at home and is calling out to children.   O: BP 156/86  Pulse 103  Temp 100.4 F (38 C) (Oral)  Resp 22  Ht 5\' 7"  (1.702 m)  Wt 283 lb 15.2 oz (128.8 kg)  BMI 44.47 kg/m2  SpO2 100% sats stable on room air.   General: pleasant, cooperative, but on several occasions trying to get up out of bed. Neck: supple, no pain.  Neuro: patient attempts to cooperate with muscle testing but not fully. At least 4/5 in all extremities but difficult to assess beyond that due to poor effort. Cranial nerve exam-no noted deficits, do not see evidence of facial droop at this time. Do not see deviation of tongue.  GU: foley in place-patient actively trying to remove on subsequent exam  Labs: ammonia not elevated  A/P:  1. AMS-will continue workup per h+p. New labs: ammonia not elevated, UDS collected and pending. Repeat Cr was decreased to 2.29 and patient at discharge was 1.86. Doubt uremia.  -patient not hypoxemic so do not believe this is contributing -obtain ABG to evaluate for hypercarbia as potential cause.  -appears similar to events over last several nights  -will remove foley catheter/1 point restraint.  -nursing staff not available to continue to give 1 on 1 attention. Will give 1mg  of Haldol. If need for increased supervision, would consider transfer to stepdown  -will monitor for seizure activity given lowered threshold with haldol.  -Patient is febrile at this time and WBC count is elevated. If vitals were to become unstable, would start empiric antibiotics. Would also consider LP at that time. LP may have to be a consideration in AM as well-will discuss with attending physician.   -repeat temperature 98.8. Will continue to follow fever curve. Once again, no nuchal rigidity.  -have not given narcotics and will continue to avoid.   Tana Conch, MD, PGY1 12/29/2011 1:42 AM Service Pager [phone]

## 2011-12-29 NOTE — Progress Notes (Signed)
I discussed the care plan with Dr.Merrell and the FPTS team and agree with assessment and plan as documented in the progress note for today. His uric acid is not adequately lowered by his Allopurinol. If his pain becomes more evidently a gout flare he could be treated with Colchicine then continued on a low dose of Colchicine with an increase of Allopurinol to 400 mg then higher if needed to reach the goal of uric acid of 5.0.     Ronald Traynor A. Sheffield Slider, MD Family Medicine Teaching Service Attending  12/29/2011 6:32 PM

## 2011-12-29 NOTE — Evaluation (Signed)
Speech Language Pathology Evaluation Patient Details Name: [name]. MRN: [id number] DOB: 02-Aug-1950 Today's Date: 12/29/2011 Time: 4098-1191 SLP Time Calculation (min): 15 min  Problem List:  Patient Active Problem List  Diagnosis  . HEPATITIS C  . HYPERCHOLESTEROLEMIA  . OBESITY, MORBID  . ERECTILE DYSFUNCTION, MILD  . HYPERTENSION, BENIGN SYSTEMIC  . COPD  . GASTROESOPHAGEAL REFLUX, NO ESOPHAGITIS  . CHRONIC KIDNEY DISEASE STAGE III (MODERATE)  . CONVULSIONS, SEIZURES, NOS  . APNEA, SLEEP  . Chest pain  . Cough  . Back pain  . Altered mental status  . Renal insufficiency  . CVA (cerebral infarction)   Past Medical History:  Past Medical History  Diagnosis Date  . Gout     uric acid-9.7  . Degenerative disc disease   . Coronary atherosclerosis     mild  . Orthostasis   . HTN (hypertension)   . Hyperlipidemia   . Obesity   . S/P lumbar fusion   . History of colonoscopy   . Heart murmur   . Cardiomyopathy   . COPD (chronic obstructive pulmonary disease)   . Shortness of breath   . Recurrent upper respiratory infection (URI)   . Pneumonia   . Sleep apnea     uses cpap  . Diabetes mellitus   . Hepatitis     hep c  . GERD (gastroesophageal reflux disease)   . Seizures   . Headache   . TIA (transient ischemic attack)    Past Surgical History:  Past Surgical History  Procedure Date  . 2d echo 12/22/2004  . Cardiac catheterization 04/02/2005  . Cpap 08/14/2005  . Lumbar disc surgery 07/03/1997    L5-S1; x2  . Mri 10/25/2005    mod to severe biforaminal narrowing L5-S1  . Persantine stress test 01/25/2005  . Pft 09/15/2005    mild to moderate obstructive airway disease  . Sleep study 08/14/2005    mod OSA/hypopnea   HPI:  61 yr old admitted with weakness/disorientation.  CT negative.  PMH:  TIA, GERD, COPD seizure disorder.   Assessment / Plan / Recommendation Clinical Impression  Pt.'s cognition and language for social conversation/situations is  mostly appropriate, however upon further assessment he is having mild difficutly in the areas of working memory, awareness, problem solving.  Pt.'s wife is not present to confirm deficits versus baseline status.  Pt. would benefit from ST while in hospital, however may be discharged today?  Recommend a home health Speech Pathology evaluation to assess abilities in his home environment.    SLP Assessment  Patient needs continued Speech Lanaguage Pathology Services    Follow Up Recommendations  Home health SLP    Frequency and Duration min 2x/week  2 weeks      SLP Goals  SLP Goals Potential to Achieve Goals: Good SLP Goal #1: Pt. will demonstrate appropriate problem solving abilities with min verba cues.  SLP Goal #2: Pt. will state situations that he would need assist with in regards hospital  and home for improved anticipatory awareness with mod verbal cues.  SLP Evaluation Prior Functioning  Cognitive/Linguistic Baseline: Information not available Type of Home: House Lives With: Spouse Vocation: Retired   IT consultant  Overall Cognitive Status: Impaired Arousal/Alertness: Awake/alert Orientation Level: Oriented to person;Oriented to place;Oriented to time Attention: Alternating Alternating Attention: Impaired Alternating Attention Impairment: Verbal basic;Functional basic Memory: Impaired Memory Impairment: Decreased recall of new information;Decreased short term memory Awareness: Impaired Awareness Impairment: Anticipatory impairment;Emergent impairment Problem Solving: Impaired Problem Solving Impairment: Functional  basic Safety/Judgment: Impaired    Comprehension  Auditory Comprehension Overall Auditory Comprehension: Appears within functional limits for tasks assessed Yes/No Questions: Within Functional Limits Commands: Not tested Conversation: Simple Interfering Components: Attention Visual Recognition/Discrimination Discrimination: Not tested Reading  Comprehension Reading Status: Impaired Functional Environmental (signs, name badge): Impaired Interfering Components: Attention    Expression Expression Primary Mode of Expression: Verbal Verbal Expression Overall Verbal Expression: Appears within functional limits for tasks assessed Initiation: No impairment Level of Generative/Spontaneous Verbalization: Conversation Repetition: No impairment Naming: No impairment Pragmatics: No impairment Written Expression Dominant Hand:  (states ambidextrous) Written Expression: Not tested   Oral / Motor Oral Motor/Sensory Function Overall Oral Motor/Sensory Function: Appears within functional limits for tasks assessed Motor Speech Overall Motor Speech: Appears within functional limits for tasks assessed Respiration: Within functional limits Phonation: Normal Resonance: Within functional limits Articulation: Within functional limitis Intelligibility: Intelligible Motor Planning: Witnin functional limits   [employer] M.Ed [employer] [phone]  12/29/2011

## 2011-12-29 NOTE — Progress Notes (Signed)
Pt transferred from 3700 and pt is very confused , bed alarm on ,call bell with in reach  Reoriented  To his surroundings  And explained all procdures

## 2011-12-29 NOTE — Evaluation (Signed)
Occupational Therapy Evaluation Patient Details Name: [name]. MRN: [id number] DOB: October 08, 1950 Today's Date: 12/29/2011 Time: 9629-5284 OT Time Calculation (min): 20 min  OT Assessment / Plan / Recommendation Clinical Impression  61 yo male admitted for AMS at [facility] and then transferred to [facility] for neurological workup. Results still pending at this time. Pt with cognitive deficits and balance deficits noted. PT could benefit from CIR admission.    OT Assessment  Patient needs continued OT Services    Follow Up Recommendations  Inpatient Rehab    Barriers to Discharge      Equipment Recommendations  Defer to next venue    Recommendations for Other Services Rehab consult  Frequency  Min 2X/week    Precautions / Restrictions Precautions Precautions: Fall   Pertinent Vitals/Pain C/O PAIN IN LT Knee area (reports gout)    ADL  Eating/Feeding: Performed;Set up Where Assessed - Eating/Feeding: Bed level Grooming: Performed;Wash/dry hands Where Assessed - Grooming: Unsupported sitting Upper Body Bathing: Performed;Chest;Right arm;Left arm;Abdomen;Minimal assistance Where Assessed - Upper Body Bathing: Unsupported sitting Lower Body Bathing: Performed;Maximal assistance Where Assessed - Lower Body Bathing: Supported sit to stand Upper Body Dressing: Performed;Set up Where Assessed - Upper Body Dressing: Unsupported sitting Lower Body Dressing: Performed;+1 Total assistance Where Assessed - Lower Body Dressing: Unsupported sitting Toilet Transfer: Performed;Moderate assistance Toilet Transfer Method: Sit to stand Toilet Transfer Equipment: Raised toilet seat with arms (or 3-in-1 over toilet) Toileting - Clothing Manipulation and Hygiene: Performed;Moderate assistance Where Assessed - Toileting Clothing Manipulation and Hygiene: Sit to stand from 3-in-1 or toilet Equipment Used: Gait belt Transfers/Ambulation Related to ADLs: Pt provided cane per pt request then pt states "i  am going to need to get a walker" Pt stand pivot only during session due to impulsive grabbing for unsafe environmental supports and c/o Lt leg pain due to gout ADL Comments: Pt agreeable to OT session and performed bath at EOB , transfer to 3n1 and repositioned supine position.     OT Diagnosis: Generalized weakness;Acute pain  OT Problem List: Decreased strength;Decreased activity tolerance;Impaired balance (sitting and/or standing);Decreased cognition;Decreased safety awareness;Decreased knowledge of use of DME or AE;Decreased knowledge of precautions;Pain OT Treatment Interventions: Self-care/ADL training;Therapeutic exercise;DME and/or AE instruction;Therapeutic activities;Cognitive remediation/compensation;Patient/family education;Balance training   OT Goals Acute Rehab OT Goals OT Goal Formulation: Patient unable to participate in goal setting Time For Goal Achievement: 01/12/12 Potential to Achieve Goals: Good ADL Goals Pt Will Perform Grooming: with set-up;Sitting at sink ADL Goal: Grooming - Progress: Goal set today Pt Will Perform Upper Body Bathing: with min assist;Standing at sink ADL Goal: Upper Body Bathing - Progress: Goal set today Pt Will Perform Lower Body Bathing: with min assist;Standing at sink ADL Goal: Lower Body Bathing - Progress: Goal set today Pt Will Perform Upper Body Dressing: with modified independence;Sit to stand from chair;Sit to stand from bed ADL Goal: Upper Body Dressing - Progress: Goal set today Pt Will Perform Lower Body Dressing: with mod assist;Sit to stand from chair;Sit to stand from bed ADL Goal: Lower Body Dressing - Progress: Goal set today Pt Will Transfer to Toilet: with min assist;3-in-1;Stand pivot transfer ADL Goal: Toilet Transfer - Progress: Goal set today Pt Will Perform Toileting - Hygiene: with min assist;Sit to stand from 3-in-1/toilet ADL Goal: Toileting - Hygiene - Progress: Goal set today  Visit Information  Last OT Received  On: 12/29/11 Assistance Needed: +1    Subjective Data  Subjective: "They can have that heating and air. 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), occupational, physical and speech therapy  Patient Stated Goal: to go home now   Prior Functioning  Home Living Lives With: Spouse (x 5 kids (6, 83,8, 21 and 89 yo )) Type of Home: House Home Access: Stairs to enter [employer] of Steps: 3 from the outside and 5 to the laundry Entrance Stairs-Rails: Left Home Layout: One level;Laundry or work area in basement [employer]: Health visitor: Standard Home Adaptive Equipment: Paediatric nurse with back;Straight cane;Walker - rolling Additional Comments: pt is a poor historian and question accuracy of the above information Prior Function Level of Independence: Independent Able to Take Stairs?: Yes Driving: No Vocation: Retired Musician: No difficulties Dominant Hand: Right    Cognition  Overall Cognitive Status: Impaired Area of Impairment: Attention;Memory;Following commands;Safety/judgement;Awareness of errors;Awareness of deficits;Problem solving Arousal/Alertness: Awake/alert Orientation Level: Disoriented to;Situation;Time Behavior During Session: Restless Current Attention Level: Sustained Memory Deficits: Pt reports working in 6 different areas of [facility] for 15 years. Pt reports being told that d/c planning is scheduled for today and that a nurse interrupted his plans. Pt poor historian. Following Commands: Follows one step commands consistently;Follows one step commands with increased time;Follows multi-step commands inconsistently Safety/Judgement: Decreased awareness of safety precautions;Decreased safety judgement for tasks assessed;Impulsive;Decreased awareness of need for assistance Safety/Judgement - Other Comments: no awareness to cognitive deficits  and requires redirection to task. Pt attempting to pull up on IV pole. Awareness of Errors: Assistance required to identify errors made;Assistance required to correct errors made Problem Solving: Pt states "oh there is an odor" pt sitting on 3N1 with void of bowel (loose in texture). Pt unaware that odor was the result of the bowel movement. Cognition - Other Comments: perserverating on d/c home today    Extremity/Trunk Assessment Right Upper Extremity Assessment RUE ROM/Strength/Tone: Within functional levels RUE Coordination: WFL - gross motor Left Upper Extremity Assessment LUE ROM/Strength/Tone: Within functional levels LUE Coordination: WFL - gross motor Right Lower Extremity Assessment RLE ROM/Strength/Tone: WFL for tasks assessed RLE Sensation: WFL - Light Touch;WFL - Proprioception RLE Coordination: WFL - gross/fine motor Left Lower Extremity Assessment LLE ROM/Strength/Tone: Deficits;Due to pain LLE ROM/Strength/Tone Deficits: Knee flexion and extension 4/5. Hip grossly 4/5 Trunk Assessment Trunk Assessment: Normal   Mobility Bed Mobility Bed Mobility: Supine to Sit Supine to Sit: 4: Min assist;With rails;HOB elevated (HOb 20 degrees) Details for Bed Mobility Assistance: pt required the use of bed rail heavily to complete mobility. Pt requires increased time Transfers Transfers: Sit to Stand;Stand to Sit Sit to Stand: 3: Mod assist;From bed;With upper extremity assist Stand to Sit: 3: Mod assist;With upper extremity assist;To bed Details for Transfer Assistance: Pt with posterior lean with sit <>Stand. pt with widen base of support to maintain upright posture. Pt holding cane off the floor and required v/c to use cane for support.   Exercise    Balance Balance Balance Assessed: Yes Static Sitting Balance Static Sitting - Balance Support: Bilateral upper extremity supported;Feet supported Static Sitting - Level of Assistance: 5: Stand by assistance Static Sitting -  Comment/# of Minutes: 15 Dynamic Sitting Balance Dynamic Sitting - Balance Support: During functional activity;Feet supported (alternating UE support) Dynamic Sitting - Level of Assistance: 4: Min Oncologist Standing - Balance Support: Bilateral upper extremity supported;During functional activity Static Standing - Level of Assistance: 4: Min assist Static Standing - Comment/# of Minutes: 1 High  Level Balance High Level Balance Activites: Turns High Level Balance Comments: Minimal assistance and use of cane with turning - wide turns and poor step sequence.   End of Session OT - End of Session Activity Tolerance: Patient tolerated treatment well Patient left: in bed;with call bell/phone within reach Nurse Communication: Mobility status;Precautions       Harrel Carina [facility] 12/29/2011, 11:21 AM Pager: [phone]

## 2011-12-29 NOTE — Evaluation (Signed)
Physical Therapy Evaluation Patient Details Name: [name]. MRN: [id number] DOB: 1950-08-28 Today's Date: 12/29/2011 Time: 4098-1191 PT Time Calculation (min): 23 min  PT Assessment / Plan / Recommendation Clinical Impression  Patient presented to hospital with AMS - work up is in progress. Patient has significant cognitive deficits. I would highly recommend an inpatient rehab consult as patient's mobility is limited and his safety is severely impacted due to his cognitive status. We will follow acutely to progress his functional mobility.      PT Assessment  Patient needs continued PT services    Follow Up Recommendations  Inpatient Rehab    Barriers to Discharge  None      Equipment Recommendations  Defer to next venue    Recommendations for Other Services Rehab consult   Frequency Min 3X/week    Precautions / Restrictions Precautions Precautions: Fall   Pertinent Vitals/Pain Left knee pain - patient relates to gout      Mobility  Bed Mobility Details for Bed Mobility Assistance: Sitting on edge of bed on arrival Transfers Transfers: Sit to Stand;Stand to Sit Sit to Stand: 4: Min assist;With upper extremity assist;From bed;From chair/3-in-1 Stand to Sit: 4: Min guard;With upper extremity assist;To bed;To chair/3-in-1 Details for Transfer Assistance: Patient with difficulty initiating stand without assistance and requires assistance to stabilize. Increased time for transition to full stand.  Ambulation/Gait Ambulation/Gait Assistance: 4: Min assist Ambulation Distance (Feet): 30 Feet Assistive device: Straight cane Ambulation/Gait Assistance Details: One episode of left knee buckling with gait.  Gait Pattern: Step-through pattern;Decreased stride length;Trunk flexed;Left flexed knee in stance General Gait Details: Improved speed and stride with increased distance but becomes more flexed at trunk. Modified Rankin (Stroke Patients Only) Pre-Morbid Rankin Score: No  significant disability Modified Rankin: Moderately severe disability     PT Diagnosis: Difficulty walking;Abnormality of gait;Generalized weakness  PT Problem List: Decreased strength;Decreased activity tolerance;Decreased balance;Decreased mobility;Decreased cognition;Decreased knowledge of use of DME;Decreased safety awareness;Decreased knowledge of precautions;Pain PT Treatment Interventions: DME instruction;Gait training;Stair training;Therapeutic activities;Therapeutic exercise;Balance training;Neuromuscular re-education;Cognitive remediation;Patient/family education   PT Goals Acute Rehab PT Goals PT Goal Formulation: With patient Time For Goal Achievement: 01/05/12 Potential to Achieve Goals: Good Pt will go Supine/Side to Sit: with modified independence PT Goal: Supine/Side to Sit - Progress: Goal set today Pt will go Sit to Supine/Side: with modified independence PT Goal: Sit to Supine/Side - Progress: Goal set today Pt will go Sit to Stand: with modified independence;with upper extremity assist PT Goal: Sit to Stand - Progress: Goal set today Pt will go Stand to Sit: with modified independence;with upper extremity assist PT Goal: Stand to Sit - Progress: Goal set today Pt will Transfer Bed to Chair/Chair to Bed: with modified independence PT Transfer Goal: Bed to Chair/Chair to Bed - Progress: Goal set today Pt will Ambulate: >150 feet;with supervision;with least restrictive assistive device PT Goal: Ambulate - Progress: Goal set today  Visit Information  Last PT Received On: 12/29/11 Assistance Needed: +1    Subjective Data  Subjective: Patient reports "Do you see those spiders over there". Patient constantly trying to call home as he states he is being discharged - he is able to state the number, but can not dial on the phone.  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 Does not recall event of yesterday except nursing placing a catheter. Patient constantly changing his mind over equipment he has at home and what he uses to walk with.  Following Commands: Follows multi-step commands inconsistently Safety/Judgement: Decreased awareness of safety precautions;Decreased safety judgement for tasks assessed Safety/Judgement - Other Comments: No awareness of cognitive issues. constantly dialling his wife's number incorrectly despite the fact he can state her number. When he dials and the line gives a busy tone patient thought his wife would be at the pizza place.  Awareness of Errors: Assistance required to identify errors made;Assistance required to correct errors made Cognition - Other Comments: Patient focused on going home today. Constantly wanting to call wife and difficult to redirect.     Extremity/Trunk Assessment Right Lower Extremity Assessment RLE ROM/Strength/Tone: WFL for tasks assessed RLE Sensation: WFL - Light Touch;WFL - Proprioception RLE Coordination: WFL - gross/fine motor Left Lower Extremity Assessment LLE ROM/Strength/Tone: Deficits;Due to pain LLE  ROM/Strength/Tone Deficits: Knee flexion and extension 4/5. Hip grossly 4/5   Balance High Level Balance High Level Balance Activites: Turns High Level Balance Comments: Minimal assistance and use of cane with turning - wide turns and poor step sequence.   End of Session PT - End of Session Equipment Utilized During Treatment: Gait belt Activity Tolerance: Patient limited by fatigue Patient left: in bed;with call bell/phone within reach;with bed alarm set Nurse Communication: Mobility status   Edwyna Perfect, PT  Pager [phone]  12/29/2011, 9:21 AM

## 2011-12-30 ENCOUNTER — Inpatient Hospital Stay (HOSPITAL_COMMUNITY): Payer: Medicare Other

## 2011-12-30 DIAGNOSIS — G459 Transient cerebral ischemic attack, unspecified: Secondary | ICD-10-CM

## 2011-12-30 LAB — URINE CULTURE: Special Requests: NORMAL

## 2011-12-30 LAB — GLUCOSE, CAPILLARY
Glucose-Capillary: 94 mg/dL (ref 70–99)
Glucose-Capillary: 118 mg/dL — ABNORMAL HIGH (ref 70–99)

## 2011-12-30 LAB — CBC
HCT: 32.9 % — ABNORMAL LOW (ref 39.0–52.0)
Hemoglobin: 11.3 g/dL — ABNORMAL LOW (ref 13.0–17.0)
RDW: 16.4 % — ABNORMAL HIGH (ref 11.5–15.5)
WBC: 18.1 10*3/uL — ABNORMAL HIGH (ref 4.0–10.5)

## 2011-12-30 LAB — BASIC METABOLIC PANEL
BUN: 22 mg/dL (ref 6–23)
Chloride: 102 mEq/L (ref 96–112)
GFR calc Af Amer: 57 mL/min — ABNORMAL LOW (ref >90)
Potassium: 4.3 mEq/L (ref 3.5–5.1)
Sodium: 137 mEq/L (ref 135–145)

## 2011-12-30 MED ORDER — ARIPIPRAZOLE 10 MG PO TABS
10.0000 mg | ORAL_TABLET | Freq: Every day | ORAL | Status: DC
  Administered 2011-12-30 – 2011-12-31 (×2): 10 mg via ORAL
  Filled 2011-12-30 (×2): qty 1

## 2011-12-30 MED ORDER — GADOBENATE DIMEGLUMINE 529 MG/ML IV SOLN
20.0000 mL | Freq: Once | INTRAVENOUS | Status: AC | PRN
  Administered 2011-12-30: 20 mL via INTRAVENOUS

## 2011-12-30 MED ORDER — HALOPERIDOL LACTATE 5 MG/ML IJ SOLN
INTRAMUSCULAR | Status: AC
  Filled 2011-12-30: qty 1

## 2011-12-30 MED ORDER — HALOPERIDOL LACTATE 5 MG/ML IJ SOLN
1.0000 mg | Freq: Once | INTRAMUSCULAR | Status: AC
  Administered 2011-12-30: 1 mg via INTRAVENOUS

## 2011-12-30 MED ORDER — TAMSULOSIN HCL 0.4 MG PO CAPS
0.4000 mg | ORAL_CAPSULE | Freq: Every day | ORAL | Status: DC
  Administered 2011-12-30 – 2012-01-01 (×3): 0.4 mg via ORAL
  Filled 2011-12-30 (×4): qty 1

## 2011-12-30 MED ORDER — LORAZEPAM BOLUS VIA INFUSION
1.0000 mg | Freq: Once | INTRAVENOUS | Status: AC
  Administered 2011-12-30: 1 mg via INTRAVENOUS
  Filled 2011-12-30: qty 1

## 2011-12-30 MED ORDER — LOSARTAN POTASSIUM 50 MG PO TABS
100.0000 mg | ORAL_TABLET | Freq: Every day | ORAL | Status: DC
  Administered 2011-12-30 – 2012-01-01 (×3): 100 mg via ORAL
  Filled 2011-12-30 (×3): qty 2

## 2011-12-30 MED ORDER — AMLODIPINE BESYLATE 10 MG PO TABS
10.0000 mg | ORAL_TABLET | Freq: Every day | ORAL | Status: DC
  Administered 2011-12-30 – 2012-01-01 (×3): 10 mg via ORAL
  Filled 2011-12-30 (×3): qty 1

## 2011-12-30 MED ORDER — LORAZEPAM 2 MG/ML IJ SOLN
INTRAMUSCULAR | Status: AC
  Administered 2011-12-30: 1 mg
  Filled 2011-12-30: qty 1

## 2011-12-30 MED ORDER — CARVEDILOL 25 MG PO TABS
50.0000 mg | ORAL_TABLET | Freq: Two times a day (BID) | ORAL | Status: DC
  Administered 2011-12-30 – 2012-01-01 (×6): 50 mg via ORAL
  Filled 2011-12-30 (×7): qty 2

## 2011-12-30 NOTE — Progress Notes (Signed)
BP elevated again at 178/117. MD notified. Instructed to recheck BP again in 15 minutes. Will pass on to oncoming RN.

## 2011-12-30 NOTE — Progress Notes (Signed)
VASCULAR LAB PRELIMINARY  PRELIMINARY  PRELIMINARY  PRELIMINARY  Carotid Dopplers completed.    Preliminary report:  Left: No ICA stenosis.  Vertebral artery flow is antegrade.  Right: unable to evaluate the right side secondary to patient's somnolent, snoring and would not rouse to turn head.  Chee Dimon, 12/30/2011, 3:55 PM

## 2011-12-30 NOTE — Progress Notes (Signed)
Mrs. Sawyers notified of patient's room change today; high fall risk status due to gait and AMS.

## 2011-12-30 NOTE — Progress Notes (Signed)
I interviewed and examined this patient and discussed the care plan with Dr. Konrad Dolores and the [facility] team and agree with assessment and plan as documented in the progress note for today. He asks about going home. He is very sedated in preparation for his MRI. I told him we would discuss going home after we see the results of his MRI.     Maynard David A. Sheffield Slider, MD Family Medicine Teaching Service Attending  12/30/2011 12:27 PM

## 2011-12-30 NOTE — Progress Notes (Signed)
Dr Margot Ables returned page re: Bp 178/114 . No orders received. Con't to monitor.

## 2011-12-30 NOTE — Discharge Summary (Signed)
Family Medicine Resident Discharge Summary  Patient ID: [name] [id number] 61 y.o. [birth year]  Admit date: 12/28/2011  Discharge date and time: 01/01/2012  Admitting Physician: Zachery Dauer, MD  Discharge Physician: Paula Compton, MD  Admission Diagnoses:  TIA Renal insufficiency [593.9] WEAKNESS Gout flare  Discharge Diagnoses: TIA, Dementia, Delirium, CKD, Weakness  Admission Condition: fair  Discharged Condition: stable  Indication for Admission: Stroke like symptoms and confusion  Hospital Course:  [name]. is a 61 y.o. year old male with a past medical history significant for TIA, a seizure disorder and COPD presenting with waxing and waning confusion and weakness in the setting of a gout flare.   Delirium/Dementia: Pt initially confused on admission. Differential for this included infection vs stroke/TIA, vs over sedation due to medications, vs acidotic stated due to poor respiratory effort vs acute delirium due to illness and other stressors including hospitalization. Per report from family this has been an increasing problem for pt over the past several weeks and especially bigger problem over the past 2 days. Several home meds were held that were concerning for possible sedation, including opioids, valium, Flexeril,and abilify (Abilify dose cut in half).  MMSE during admission showed a score of 23/30. Pt symptoms waxed and waned during admission which required dosing at times w/ additional Haldol. Likely component of vascular dementia as pt appears to be making a step wise regression, w/ marked change from last admission. Oriented x4 and appropriate in conversation at discharge. Aricept 5 mg started, Abilify increased to 15 mg as of 6/30.  CVA/TIA: Pt w/ likely TIA at time of admission. Musculoskeletal/neuro symptoms virtually resolved at time of admission. Stroke workup initiated. CT, MRI, MRA were all negative thought considerable motion artifact w/ MR studies.  Carotid doppler studies showed no ICA stenosis on LEFT, unable to be obtained on RIGHT. Pt on ASA on admission which was continued. PT/OT worked w/ pt and recommended inpatient rehab. The CIR team was consulted and pt met criteria for inpatient rehab but refused dispo in favor of going home with home health.  Risk stratification labs included below.   COPD: Pt on CPAP at home which was continued here in the hospital. Pt did not tolerate this well and was often placed on [place]. Concern that this may be contributing to pt delirium was addressed by care team. An ABG was obtained one evening as pt was acting particulary delirius. This was fairly unimpressive outside of an O2 of 74.2 which showed likely acute hypoventilation. Pt provided supplemental O2 as needed for sats below 90%.   CKD stage III: Pt w/ acute on chronic renal insufficiency. Baseline Cr between 1.5-2. Elevated to 3.27 on admission. Suspected possible mixed picture of prerenal w/ decreased PO and intrinsic renal failure. Pt reported significant NSAID ingestion in the days leading up to admission. Home ARB, and HCTZ and NSAIDs held. ARB added back as Cr normalized and BP became elevated. Last Cr prior to DC was 1.49.  HTN: SBP initially ranging from 139-164 on admission. Home BP meds were held due to concern for new stroke, so as to allow for maximal CNS perfusion, and due to some causing renal injury. BP meds added back as Stroke was ruled out and Cr returned to baseline and BP began to become elevated. BP at time of DC 142/87.   Gout: Pt complaining of gout pain on admission. No consistent exam findings to support this and pts pain complaints seemed to change w/ delirium. Allopurinol continued during  admission. Uric acid level normal.   Consults: Psychiatry on 6/30, recommended increase of Abilify to 15 mg PO daily and addition of Aricept 5 mg PO daily. PMR on 7/1, recommended inpatient rehab based off global rather than focal deficits;  patient refused in favor of home health PT.  Significant Diagnostic Studies: 6/29, MRI Brain W/Wo Contrast:  12/30/2011 *RADIOLOGY REPORT* Clinical Data: TIA. MRI HEAD WITHOUT AND WITH CONTRAST IMPRESSION: Carotid bifurcation on the left appears normal. Carotid bifurcation on the right shows smooth narrowing of the ICA through the bulb region. Maximal stenosis is 50% by NASCET criteria. Multiple stenoses of the distal vertebral arteries and basilar artery, potentially flow limiting. Stenosis of the right middle cerebral artery at the M1/M2 junction which could be flow limiting. Original Report Authenticated By: Thomasenia Sales, M.D.   6/29, Carotid doppler: No ICA stenosis on LEFT, RIGHT side unable to be evaluated   6/28, MRI Brain Ltd W/o Cm:  12/29/2011 *RADIOLOGY REPORT* Clinical Data: Weakness. Disorientation. MRI HEAD WITHOUT CONTRAST MRA HEAD WITHOUT CONTRAST IMPRESSION: All major vessels patent into the brain. Limited detail because of the motion degradation. Original Report Authenticated By: Thomasenia Sales, M.D.   LP by interventional radiology on 7/1 with Gram stain/culture, cell count, protein, glucose pending at discharge.  Disposition: home with home health PT  Patient Instructions: Medication List  As of 01/02/2012  2:47 PM   STOP taking these medications         cyclobenzaprine 10 MG tablet      diazepam 10 MG tablet      gabapentin 300 MG capsule      hydrochlorothiazide 25 MG tablet      HYDROcodone-acetaminophen 5-500 MG per tablet      lisinopril-hydrochlorothiazide 20-12.5 MG per tablet      minoxidil 2.5 MG tablet      oxycodone 5 MG capsule      zolpidem 10 MG tablet         TAKE these medications         allopurinol 300 MG tablet   Commonly known as: ZYLOPRIM   Take 300 mg by mouth daily.      amLODipine 10 MG tablet   Commonly known as: NORVASC   Take 10 mg by mouth daily.      ARIPiprazole 15 MG tablet   Commonly known as: ABILIFY   Take 1 tablet  (15 mg total) by mouth daily.      aspirin 81 MG tablet   Take 81 mg by mouth daily.      carvedilol 25 MG tablet   Commonly known as: COREG   Take 50 mg by mouth 2 (two) times daily with a meal.      Cholecalciferol 2000 UNITS Tbdp   Take 1 capsule by mouth daily.      donepezil 5 MG tablet   Commonly known as: ARICEPT   Take 1 tablet (5 mg total) by mouth at bedtime.      furosemide 40 MG tablet   Commonly known as: LASIX   Take 40 mg by mouth daily.      gabapentin 600 MG tablet   Commonly known as: NEURONTIN   Take 1 tablet (600 mg total) by mouth 4 (four) times daily -  before meals and at bedtime.      glipiZIDE 5 MG tablet   Commonly known as: GLUCOTROL   Take 2.5 mg by mouth 2 (two) times daily before a meal.  ibuprofen 200 MG tablet   Commonly known as: ADVIL,MOTRIN   Take 600 mg by mouth every 6 (six) hours as needed. For pain      levETIRAcetam 750 MG tablet   Commonly known as: KEPPRA   Take 750-1,500 mg by mouth 3 (three) times daily. Takes 1 tablet (750 mg) in the morning, 1 tablet (750 mg) at noon, and 2 tablets (1500 mg) at bedtime      LIPITOR 80 MG tablet   Generic drug: atorvastatin   Take 40 mg by mouth at bedtime.      losartan 100 MG tablet   Commonly known as: COZAAR   Take 100 mg by mouth daily.      omega-3 acid ethyl esters 1 G capsule   Commonly known as: LOVAZA   Take 4 g by mouth daily.      omeprazole 20 MG tablet   Commonly known as: PRILOSEC OTC   Take 20 mg by mouth daily.      Tamsulosin HCl 0.4 MG Caps   Commonly known as: FLOMAX   Take 0.4 mg by mouth daily.      traMADol 50 MG tablet   Commonly known as: ULTRAM   Take 1 tablet (50 mg total) by mouth every 6 (six) hours as needed.      VENTOLIN HFA 108 (90 BASE) MCG/ACT inhaler   Generic drug: albuterol   Inhale 1-2 puffs into the lungs every 4 (four) hours as needed. For cough and wheeze            Activity: Increase as tolerated with home health PT Diet:  diabetic diet Wound Care: none needed  Follow-up with PCP (Dr. Mikel Cella) in 1 week.  Follow-up Items: 1. Follow-up mental status  Review results from LP on 7/1 (Gram stain/culture, cell count, protein, glucose).  Evaluate for any further work-up. 2. Follow-up addition of Aricept for progressive Dementia 3. Follow-up tolerance of increased Abilify dose.   Signed: Maryjean Ka, MD Family Medicine Resident PGY-1 01/02/2012 2:47 PM

## 2011-12-30 NOTE — Progress Notes (Signed)
Family Medicine Teaching Service Hospital Progress Note  Patient name: [name]. Medical record number: [id number] Date of birth: [birth year] Age: 61 y.o. Gender: male    LOS: 2 days   Primary Care Provider: Rodman Pickle, MD  Overnight Events:  Doing well this am. No complaints per pt. Eating, voiding and urinating well. Continues to be delirious per nurses.  Objective: Vital signs in last 24 hours: Temp:  [97 F (36.1 C)-98.7 F (37.1 C)] 98.4 F (36.9 C) (06/29 0655) Pulse Rate:  [89-99] 89  (06/29 0655) Resp:  [20] 20  (06/29 0655) BP: (150-186)/(84-121) 178/121 mmHg (06/29 0736) SpO2:  [93 %-100 %] 100 % (06/29 0655)  Wt Readings from Last 3 Encounters:  12/28/11 283 lb 15.2 oz (128.8 kg)  11/30/11 289 lb 3.9 oz (131.2 kg)  10/23/11 300 lb (136.079 kg)     Current Facility-Administered Medications  Medication Dose Route Frequency Provider Last Rate Last Dose  . 0.9 %  sodium chloride infusion   Intravenous Continuous Dessa Phi, MD 100 mL/hr at 12/28/11 2248    . allopurinol (ZYLOPRIM) tablet 300 mg  300 mg Oral Daily Josalyn Funches, MD   300 mg at 12/29/11 0941  . amLODipine (NORVASC) tablet 10 mg  10 mg Oral Daily Ozella Rocks, MD      . ARIPiprazole (ABILIFY) tablet 10 mg  10 mg Oral Daily Ozella Rocks, MD      . aspirin chewable tablet 81 mg  81 mg Oral Daily Dessa Phi, MD   81 mg at 12/29/11 0941  . atorvastatin (LIPITOR) tablet 40 mg  40 mg Oral QHS Dessa Phi, MD   40 mg at 12/29/11 2120  . carvedilol (COREG) tablet 50 mg  50 mg Oral BID WC Ozella Rocks, MD      . enoxaparin (LOVENOX) injection 40 mg  40 mg Subcutaneous QHS Tobin Chad, MD   40 mg at 12/29/11 2120  . gabapentin (NEURONTIN) tablet 600 mg  600 mg Oral TID AC & HS Josalyn Funches, MD   600 mg at 12/30/11 0546  . haloperidol lactate (HALDOL) injection 1 mg  1 mg Intravenous Once Ozella Rocks, MD      . levETIRAcetam (KEPPRA) tablet 1,500 mg  1,500 mg Oral QHS  Dessa Phi, MD   1,500 mg at 12/29/11 2120  . levETIRAcetam (KEPPRA) tablet 750 mg  750 mg Oral Custom Josalyn Funches, MD   750 mg at 12/30/11 0804  . LORazepam (ATIVAN) bolus via infusion 1 mg  1 mg Intravenous Once Ozella Rocks, MD      . senna-docusate (Senokot-S) tablet 1 tablet  1 tablet Oral QHS PRN Dessa Phi, MD      . Tamsulosin HCl (FLOMAX) capsule 0.4 mg  0.4 mg Oral QPC breakfast Ozella Rocks, MD      . traMADol Janean Sark) tablet 50 mg  50 mg Oral Q6H PRN Dessa Phi, MD   50 mg at 12/30/11 0804  . DISCONTD: ARIPiprazole (ABILIFY) tablet 5 mg  5 mg Oral Daily Dessa Phi, MD   5 mg at 12/29/11 0941  . DISCONTD: enoxaparin (LOVENOX) injection 30 mg  30 mg Subcutaneous Q24H Josalyn Funches, MD   30 mg at 12/28/11 2253     PE: Gen: NAD, obese  HEENT: MMM  CV: RRR  Res: normal effort, on RA  Ext/Musc: 2+ pulses throughout, no joint tenderness  Neuro: CN grossly intact   Labs/Studies:   Lab 12/30/11 0809 12/29/11  4098 12/28/11 2201 12/28/11 1135  WBC 18.1* 19.6* 19.3* --  HGB 11.3* 11.5* 11.6* --  HCT 32.9* 33.2* 33.2* --  PLT 434* 424* 402* --  NEUTOPHILPCT -- -- -- 85*  MONOPCT -- -- -- 6    Lab 12/30/11 0809 12/29/11 0620 12/28/11 2201 12/28/11 1135  NA 137 135 -- 134*  K 4.3 4.3 -- 4.5  CL 102 99 -- 98  CO2 22 23 -- 25  BUN 22 30* -- 38*  CREATININE 1.49* 1.93* 2.29* --  LABGLOM -- -- -- --  GLUCOSE 100* -- -- --  CALCIUM 9.4 9.6 -- 9.1        Assessment/Plan: [name]. is a 61 y.o. year old male with a past medical history significant for TIA, a seizure disorder and COPD presenting with waxing and waning altered mental status and weakness in the setting of a potential gout flare.   Dementia/Delerium: Possible new stroke w/ likely vascular dementia and delerium. Abilify decreased on admssion due to concern for over sedation and AMS. Patient has a history of TIA. Symptoms also potentially due to respiratory  depression/hypoventilation. Pt w/ h/o OSA on CPAP. Ammonia nomral.  - Increase Abilify to home dose of 10mg . - Recommend aricept for possible outpt.   CVA/TIA: CT negative on admission. No noticeable deficits today. PT recommending inpt rehab, more from global deficits vs focal deficits. Workup is not complete.  - Carotid doppler today - MRI/MRA today with sedation (Haldol 1mg  Ativan 1mg ) - CIR to evaluate today for potential inpt rehab - Continue PT/OT  Seizure: No evidence of seizure.  - Continue home Keppra   HTN: BP continues to rise since admission. Home BP meds initially held due to stroke concern. - Will restart home amlodipine, Carvedilol and Cozaar.  - continue to monitor  Acute on chronic kidney injury: Baseline Cr between 1.5-2. Elevated to 3.27 on admission and 1.49 today. Suspect intrarenal injury from increased ibuprofen use in the setting of possible gout flare. Some improvement w/ fluids  -Continue IVF  -repeat BMET in AM   COPD: Continues to require nightly O2. Likely contributing to overall mental status change. Stable with improved CXR on admission compared to previous one.   -supplemental O2 as needed.   Elevated WBC: Unclear etiology. Afebrile since admission. UA negative, CXR improved, blood cultures pending, no nuchal rigidity.  - Perform LP and start empiric meningitis coverage if patient has a decline in mental status with fever.   Gout: questionable flare. Uric acid normal - Continue allopurinol   FEN/GI: NPO pending bedside swallow evaluation then soft mechanical diet since patient does not have his dentures. Electrolytes WNL.   Prophylaxis:  - lovenox.  - Protonix   Dispo: pending work-up and continued clinical improvement   LOS 2  Signed: Shelly Flatten, MD Family Medicine Resident PGY-1 [phone] 12/30/2011 9:41 AM

## 2011-12-30 NOTE — Progress Notes (Signed)
Pt's BP elevated 186/99. MD notified. No new orders received. Will cont to monitor.

## 2011-12-31 DIAGNOSIS — F39 Unspecified mood [affective] disorder: Secondary | ICD-10-CM

## 2011-12-31 DIAGNOSIS — I359 Nonrheumatic aortic valve disorder, unspecified: Secondary | ICD-10-CM

## 2011-12-31 LAB — GLUCOSE, CAPILLARY
Glucose-Capillary: 88 mg/dL (ref 70–99)
Glucose-Capillary: 95 mg/dL (ref 70–99)
Glucose-Capillary: 94 mg/dL (ref 70–99)

## 2011-12-31 MED ORDER — ARIPIPRAZOLE 15 MG PO TABS
15.0000 mg | ORAL_TABLET | Freq: Every day | ORAL | Status: DC
  Administered 2012-01-01: 15 mg via ORAL
  Filled 2011-12-31: qty 1

## 2011-12-31 MED ORDER — WHITE PETROLATUM GEL
Status: AC
  Filled 2011-12-31: qty 5

## 2011-12-31 MED ORDER — DONEPEZIL HCL 5 MG PO TABS
5.0000 mg | ORAL_TABLET | Freq: Every day | ORAL | Status: DC
  Administered 2011-12-31 – 2012-01-01 (×2): 5 mg via ORAL
  Filled 2011-12-31 (×2): qty 1

## 2011-12-31 NOTE — Progress Notes (Signed)
Family Medicine Teaching Service Hospital Progress Note  Patient name: [name]. Medical record number: [id number] Date of birth: 05-18-1951 Age: 61 y.o. Gender: male    LOS: 3 days   Primary Care Provider: Rodman Pickle, MD  Overnight Events:  Doing well this am. No complaints per pt, ready to go home.  Per RN, delirium continues to wax and wane, but improved overall.  Also had a couple of loose stools and has been "very gassy", although patient does not complain of this.    Objective: Vital signs in last 24 hours: Temp:  [97.6 F (36.4 C)-98.6 F (37 C)] 98.5 F (36.9 C) (06/30 0543) Pulse Rate:  [70-84] 80  (06/30 0543) Resp:  [20-22] 20  (06/30 0543) BP: (107-167)/(72-90) 137/78 mmHg (06/30 0543) SpO2:  [94 %-97 %] 95 % (06/30 0543)  Wt Readings from Last 3 Encounters:  12/28/11 283 lb 15.2 oz (128.8 kg)  11/30/11 289 lb 3.9 oz (131.2 kg)  10/23/11 300 lb (136.079 kg)     Current Facility-Administered Medications  Medication Dose Route Frequency Provider Last Rate Last Dose  . 0.9 %  sodium chloride infusion   Intravenous Continuous Dessa Phi, MD 100 mL/hr at 12/31/11 0646    . allopurinol (ZYLOPRIM) tablet 300 mg  300 mg Oral Daily Dessa Phi, MD   300 mg at 12/30/11 0958  . amLODipine (NORVASC) tablet 10 mg  10 mg Oral Daily Ozella Rocks, MD   10 mg at 12/30/11 0956  . ARIPiprazole (ABILIFY) tablet 10 mg  10 mg Oral Daily Ozella Rocks, MD   10 mg at 12/30/11 0956  . aspirin chewable tablet 81 mg  81 mg Oral Daily Josalyn Funches, MD   81 mg at 12/30/11 0958  . atorvastatin (LIPITOR) tablet 40 mg  40 mg Oral QHS Dessa Phi, MD   40 mg at 12/30/11 2152  . carvedilol (COREG) tablet 50 mg  50 mg Oral BID WC Ozella Rocks, MD   50 mg at 12/30/11 1724  . enoxaparin (LOVENOX) injection 40 mg  40 mg Subcutaneous QHS Tobin Chad, MD   40 mg at 12/30/11 2153  . gabapentin (NEURONTIN) tablet 600 mg  600 mg Oral TID AC & HS Josalyn Funches, MD    600 mg at 12/31/11 0539  . levETIRAcetam (KEPPRA) tablet 1,500 mg  1,500 mg Oral QHS Josalyn Funches, MD   1,500 mg at 12/30/11 2152  . levETIRAcetam (KEPPRA) tablet 750 mg  750 mg Oral Custom Josalyn Funches, MD   750 mg at 12/30/11 1455  . losartan (COZAAR) tablet 100 mg  100 mg Oral Daily Ozella Rocks, MD   100 mg at 12/30/11 1455  . senna-docusate (Senokot-S) tablet 1 tablet  1 tablet Oral QHS PRN Josalyn Funches, MD      . Tamsulosin HCl (FLOMAX) capsule 0.4 mg  0.4 mg Oral QPC breakfast Ozella Rocks, MD   0.4 mg at 12/30/11 0954  . traMADol (ULTRAM) tablet 50 mg  50 mg Oral Q6H PRN Dessa Phi, MD   50 mg at 12/31/11 0344     PE: Gen: NAD, obese  HEENT: MMM  CV: RRR  Res: normal effort, on RA  Ext/Musc: 2+ pulses throughout, no joint tenderness  Neuro: CN grossly intact, he is alert to person, place.  Regarding time he thinks the month is July but he does know the year.  He is very appropriate and comprehensible in conversation   Labs/Studies:   Lab  12/30/11 0809 12/29/11 0620 12/28/11 2201 12/28/11 1135  WBC 18.1* 19.6* 19.3* --  HGB 11.3* 11.5* 11.6* --  HCT 32.9* 33.2* 33.2* --  PLT 434* 424* 402* --  NEUTOPHILPCT -- -- -- 85*  MONOPCT -- -- -- 6    Lab 12/30/11 0809 12/29/11 0620 12/28/11 2201 12/28/11 1135  NA 137 135 -- 134*  K 4.3 4.3 -- 4.5  CL 102 99 -- 98  CO2 22 23 -- 25  BUN 22 30* -- 38*  CREATININE 1.49* 1.93* 2.29* --  LABGLOM -- -- -- --  GLUCOSE 100* -- -- --  CALCIUM 9.4 9.6 -- 9.1     Mr [name] Wo Contrast  12/30/2011  *RADIOLOGY REPORT*  Clinical Data:  TIA.  MRI HEAD WITHOUT AND WITH CONTRAST   IMPRESSION: Carotid bifurcation on the left appears normal.  Carotid bifurcation on the right shows smooth narrowing of the ICA through the bulb region.  Maximal stenosis is 50% by NASCET criteria.  Multiple stenoses of the distal vertebral arteries and basilar artery, potentially flow limiting.  Stenosis of the right middle cerebral artery at  the M1/M2 junction which could be flow limiting.  Original Report Authenticated By: Thomasenia Sales, M.D.   Mr Brain Ltd W/o Cm  12/29/2011  *RADIOLOGY REPORT*  Clinical Data:  Weakness.  Disorientation.  MRI HEAD WITHOUT CONTRAST MRA HEAD WITHOUT CONTRAST    IMPRESSION: All major vessels patent into the brain.  Limited detail because of the motion degradation.  Original Report Authenticated By: Thomasenia Sales, M.D.   Carotid doppler: prelim with no ICA stenosis on L, R side unable to be performed.        Assessment/Plan: [name]. is a 61 y.o. year old male with a past medical history significant for TIA, a seizure disorder and COPD presenting with waxing and waning altered mental status and weakness in the setting of a potential gout flare.   Dementia/Delerium: Possible new stroke w/ likely vascular dementia and delerium. Abilify decreased on admssion due to concern for over sedation and AMS. Patient has a history of TIA. Symptoms also potentially due to respiratory depression/hypoventilation. Pt w/ h/o OSA on CPAP. Ammonia nomral.  - Continue abilify - Will go ahead and start aricept for possible baseline dementia  CVA/TIA: CT negative on admission. No noticeable deficits today. PT recommending inpt rehab, more from global deficits vs focal deficits. Workup is not complete.  - Carotid doppler with no ICA stenosis L side, R side unable to be evaluated - MRI/MRA as above, stenosis of the R MCA and vertebrobasilar system - CIR to evaluate for potential inpt rehab - Continue PT/OT, has been OOB to bedside toilet and up with PT. -2D Echo to be done  Seizure: No evidence of seizure.  Keppra level, mildly supratherapeutic.   - Continue home Keppra   HTN:  Home BP meds initially held due to stroke concern, now restarted, with improvement in BP - Cont. home amlodipine, Carvedilol and Cozaar.  - continue to monitor  Acute on chronic kidney injury: Baseline Cr between 1.5-2. Elevated to  3.27 on admission and 1.49 on last check. Suspect intrarenal injury from increased ibuprofen use in the setting of possible gout flare. Some improvement w/ fluids  -Has been on IVF, taking good PO.  Will go ahead [facility] fluids today -repeat BMET in AM   COPD: Weaned from O2 overnight. Likely contributing to overall mental status change. Stable with improved CXR on admission compared to  previous one.   -supplemental O2 as needed.   Elevated WBC: Unclear etiology. Afebrile since admission. UA negative, CXR improved, blood cultures pending, no nuchal rigidity.  - Perform LP and start empiric meningitis coverage if patient has a decline in mental status with fever.   Gout: questionable flare. Uric acid normal - Continue allopurinol   FEN/GI:  Dysphagia III,  Will KVO fluids  Prophylaxis:  - lovenox.  - Protonix   Dispo: pending work-up and continued clinical improvement   LOS 3  Signed: Victory Strollo, MD Family Medicine Resident PGY-2 [phone] 12/31/2011 7:52 AM

## 2011-12-31 NOTE — Progress Notes (Signed)
I interviewed and examined this patient and discussed the care plan with Dr. Ashley Royalty and the [facility] team and agree with assessment and plan as documented in the progress note for today. He is currently saying that he wants to go home today to help with his family. He is oriented. Standing his Romberg is unsteady such that he almost fell backward. His wife called while I was in the room. She confirms that he sees a [place] psychiatrist for PTSD, but that he has never demonstrated this type of behavior. He has been hallucinating and fighting with visiting family members per her report.  He is alert,  partially oriented today, but registers 3 words and only recalls one. This may be delirium, but Given his persistently elevated WBC with negative urine and blood cultures, we should proceed with LP, but I don't think that he will agree. We will consult psychiatry, but may need to increase his Abilify to 20 mg if that consult will be delayed, to allow him to be more reasonable in his decision making.     Marlicia Sroka A. Sheffield Slider, MD Family Medicine Teaching Service Attending  12/31/2011 9:59 AM

## 2011-12-31 NOTE — Consult Note (Signed)
Reason for Consult:AMS Referring Physician: unknown  [name]. is an 61 y.o. male.  HPI: [name]. is a 61 y.o. year old male with a past medical history significant for TIA, a seizure disorder and COPD on supplemental O2 via [place] 2-4 L presenting with a 3 day history of being disoriented and weakness. His wife reports that he had waxing and waning episode of confusion where he did not recognize her or their young children. His wife called EMS on the day of admission when his confusion returned. He admits to weakness x 3 days mostly on his L side. His weakness is associated with R eye droop which he first noticed 4 days ago and tremor. He denies slurred speech, falls, sick contacts and fever.  Along with weakness he complains of headaches, pain in his L ankle and knee and blurred vision.  He states that his headache is a choric problem which he attributes to missing his gabapentin dose. He states that his ankle pain is secondary to a recent gout flare which is is treating his his baseline allopurinol and by taking ibuprofen which he knows he should not take but takes anyway for the pain. He repots talking about half a bottle in the last few days. He admits to blurred vision x 3 months.   Reports being confused before coming here. Thinks he is close to his baseline . Not sure what happened to him. Reports good mood. Willing to consider LP now. Denies any manic or depressive symptoms. Reports hx of PTSD but deneis any active symtpoms now. Reports seeing monster and good AH and it does not bother him much and and it is going on for the last many years now. [facility] and [facility]ify helps with his mood. Active and preacher in church. Per friend who visited him reported that he is close to his baseline. I talked with her with pt's permission today. Prefers home PT now.   Past Medical History  Diagnosis Date  . Gout     uric acid-9.7  . Degenerative disc disease   . Coronary  atherosclerosis     mild  . Orthostasis   . HTN (hypertension)   . Hyperlipidemia   . Obesity   . S/P lumbar fusion   . History of colonoscopy   . Heart murmur   . Cardiomyopathy   . COPD (chronic obstructive pulmonary disease)   . Shortness of breath   . Recurrent upper respiratory infection (URI)   . Pneumonia   . Sleep apnea     uses cpap  . Diabetes mellitus   . Hepatitis     hep c  . GERD (gastroesophageal reflux disease)   . Seizures   . Headache   . TIA (transient ischemic attack)     Past Surgical History  Procedure Date  . 2d echo 12/22/2004  . Cardiac catheterization 04/02/2005  . Cpap 08/14/2005  . Lumbar disc surgery 07/03/1997    L5-S1; x2  . Mri 10/25/2005    mod to severe biforaminal narrowing L5-S1  . Persantine stress test 01/25/2005  . Pft 09/15/2005    mild to moderate obstructive airway disease  . Sleep study 08/14/2005    mod OSA/hypopnea    Family History  Problem Relation Age of Onset  . Coronary artery disease Mother   . Diabetes    . Hypertension Mother     Social History:  reports that he has never smoked. He has never  used smokeless tobacco. He reports that he does not drink alcohol or use illicit drugs.  Allergies:  Allergies  Allergen Reactions  . Codeine Itching and Other (See Comments)    Pt has this allergy in here but says he's not allergic to anything    Medications: I have reviewed the patient's current medications.  Results for orders placed during the hospital encounter of 12/28/11 (from the past 48 hour(s))  GLUCOSE, CAPILLARY     Status: Abnormal   Collection Time   12/29/11 11:07 PM      Component Value Range Comment   Glucose-Capillary 111 (*) 70 - 99 mg/dL   GLUCOSE, CAPILLARY     Status: Normal   Collection Time   12/30/11  6:56 AM      Component Value Range Comment   Glucose-Capillary 94  70 - 99 mg/dL   CBC     Status: Abnormal   Collection Time   12/30/11  8:09 AM      Component Value Range Comment   WBC 18.1  (*) 4.0 - 10.5 K/uL    RBC 4.57  4.22 - 5.81 MIL/uL    Hemoglobin 11.3 (*) 13.0 - 17.0 g/dL    HCT 04.5 (*) 40.9 - 52.0 %    MCV 72.0 (*) 78.0 - 100.0 fL    MCH 24.7 (*) 26.0 - 34.0 pg    MCHC 34.3  30.0 - 36.0 g/dL    RDW 81.1 (*) 91.4 - 15.5 %    Platelets 434 (*) 150 - 400 K/uL   BASIC METABOLIC PANEL     Status: Abnormal   Collection Time   12/30/11  8:09 AM      Component Value Range Comment   Sodium 137  135 - 145 mEq/L    Potassium 4.3  3.5 - 5.1 mEq/L    Chloride 102  96 - 112 mEq/L    CO2 22  19 - 32 mEq/L    Glucose, Bld 100 (*) 70 - 99 mg/dL    BUN 22  6 - 23 mg/dL    Creatinine, Ser 7.82 (*) 0.50 - 1.35 mg/dL    Calcium 9.4  8.4 - 95.6 mg/dL    GFR calc non Af Amer 49 (*) >90 mL/min    GFR calc Af Amer 57 (*) >90 mL/min   GLUCOSE, CAPILLARY     Status: Abnormal   Collection Time   12/30/11 11:38 AM      Component Value Range Comment   Glucose-Capillary 118 (*) 70 - 99 mg/dL   GLUCOSE, CAPILLARY     Status: Abnormal   Collection Time   12/30/11  4:47 PM      Component Value Range Comment   Glucose-Capillary 101 (*) 70 - 99 mg/dL    Comment 1 Documented in Chart      Comment 2 Notify RN     GLUCOSE, CAPILLARY     Status: Normal   Collection Time   12/30/11  9:03 PM      Component Value Range Comment   Glucose-Capillary 88  70 - 99 mg/dL   GLUCOSE, CAPILLARY     Status: Normal   Collection Time   12/31/11  6:54 AM      Component Value Range Comment   Glucose-Capillary 95  70 - 99 mg/dL   GLUCOSE, CAPILLARY     Status: Normal   Collection Time   12/31/11 11:15 AM      Component Value Range Comment   Glucose-Capillary  94  70 - 99 mg/dL   GLUCOSE, CAPILLARY     Status: Abnormal   Collection Time   12/31/11  4:20 PM      Component Value Range Comment   Glucose-Capillary 119 (*) 70 - 99 mg/dL     Mr Angiogram Neck W Wo Contrast  12/30/2011  *RADIOLOGY REPORT*  Clinical Data:  TIA.  MRI HEAD WITHOUT AND WITH CONTRAST  Technique:  Multiplanar, multiecho pulse  sequences of the brain and surrounding structures were obtained without and with intravenous contrast.  Contrast: 20mL MULTIHANCE GADOBENATE DIMEGLUMINE 529 MG/ML IV SOLN  Comparison:  06/27 and 12/29/2011  Findings:  Again, the examination suffers from considerable motion degradation.  Diffusion imaging does not show any acute or subacute infarction.  The brainstem and cerebellum appear normal.  The cerebral hemispheres show a few old small vessel ischemic changes in the deep white matter and in the right basal ganglia region.  No cortical or large vessel territory insult.  No mass lesion, hemorrhage, hydrocephalus or extra-axial collection.  No pituitary mass.  No inflammatory sinus disease.  No skull or skull base lesion.  IMPRESSION: Motion degraded exam.  No acute finding.  Mild chronic small vessel change of the hemispheric deep white matter and right basal ganglia.  MRA NECK WITHOUT AND WITH CONTRAST  Technique:  Angiographic images of the neck were obtained using MRA technique without and with intravenous contrast.  Carotid stenosis measurements (when applicable) are obtained utilizing NASCET criteria, using the distal internal carotid diameter as the denominator.  Contrast: 20mL MULTIHANCE GADOBENATE DIMEGLUMINE 529 MG/ML IV SOLN  Findings:  Branching pattern of the brachiocephalic vessels from the arch is normal.  No origin stenoses.  Both common carotid arteries are widely patent to their respective bifurcation.  On the left, carotid bifurcation appears normal without narrowing or irregularity.  On the right, there is narrowing of the internal carotid artery through the region of the bulb.  Maximal narrowing is on the order of 50%.  This could be due to atherosclerotic disease most commonly.  Dissection is not excluded, but usually effects the more distal cervical internal carotid artery.  The examination includes the circle of Willis vessels.  There appears to be stenosis of the right middle cerebral  artery 1 cm beyond its origin that could be flow limiting.  Both anterior cerebrals receiving supply from the left carotid circulation.  Vertebral artery origins are poorly evaluated because of motion degradation.  Vertebral artery origin stenoses are not excluded. Beyond the origin regions, the vessels are widely patent through the cervical region.  There are multiple stenoses of the distal vertebral arteries and basilar artery, not primarily evaluated.  IMPRESSION: Carotid bifurcation on the left appears normal.  Carotid bifurcation on the right shows smooth narrowing of the ICA through the bulb region.  Maximal stenosis is 50% by NASCET criteria.  Multiple stenoses of the distal vertebral arteries and basilar artery, potentially flow limiting.  Stenosis of the right middle cerebral artery at the M1/M2 junction which could be flow limiting.  Original Report Authenticated By: Thomasenia Sales, M.D.   Mr Laqueta Jean ZO Contrast  12/30/2011  *RADIOLOGY REPORT*  Clinical Data:  TIA.  MRI HEAD WITHOUT AND WITH CONTRAST  Technique:  Multiplanar, multiecho pulse sequences of the brain and surrounding structures were obtained without and with intravenous contrast.  Contrast: 20mL MULTIHANCE GADOBENATE DIMEGLUMINE 529 MG/ML IV SOLN  Comparison:  06/27 and 12/29/2011  Findings:  Again, the examination suffers from  considerable motion degradation.  Diffusion imaging does not show any acute or subacute infarction.  The brainstem and cerebellum appear normal.  The cerebral hemispheres show a few old small vessel ischemic changes in the deep white matter and in the right basal ganglia region.  No cortical or large vessel territory insult.  No mass lesion, hemorrhage, hydrocephalus or extra-axial collection.  No pituitary mass.  No inflammatory sinus disease.  No skull or skull base lesion.  IMPRESSION: Motion degraded exam.  No acute finding.  Mild chronic small vessel change of the hemispheric deep white matter and right basal  ganglia.  MRA NECK WITHOUT AND WITH CONTRAST  Technique:  Angiographic images of the neck were obtained using MRA technique without and with intravenous contrast.  Carotid stenosis measurements (when applicable) are obtained utilizing NASCET criteria, using the distal internal carotid diameter as the denominator.  Contrast: 20mL MULTIHANCE GADOBENATE DIMEGLUMINE 529 MG/ML IV SOLN  Findings:  Branching pattern of the brachiocephalic vessels from the arch is normal.  No origin stenoses.  Both common carotid arteries are widely patent to their respective bifurcation.  On the left, carotid bifurcation appears normal without narrowing or irregularity.  On the right, there is narrowing of the internal carotid artery through the region of the bulb.  Maximal narrowing is on the order of 50%.  This could be due to atherosclerotic disease most commonly.  Dissection is not excluded, but usually effects the more distal cervical internal carotid artery.  The examination includes the circle of Willis vessels.  There appears to be stenosis of the right middle cerebral artery 1 cm beyond its origin that could be flow limiting.  Both anterior cerebrals receiving supply from the left carotid circulation.  Vertebral artery origins are poorly evaluated because of motion degradation.  Vertebral artery origin stenoses are not excluded. Beyond the origin regions, the vessels are widely patent through the cervical region.  There are multiple stenoses of the distal vertebral arteries and basilar artery, not primarily evaluated.  IMPRESSION: Carotid bifurcation on the left appears normal.  Carotid bifurcation on the right shows smooth narrowing of the ICA through the bulb region.  Maximal stenosis is 50% by NASCET criteria.  Multiple stenoses of the distal vertebral arteries and basilar artery, potentially flow limiting.  Stenosis of the right middle cerebral artery at the M1/M2 junction which could be flow limiting.  Original Report  Authenticated By: Thomasenia Sales, M.D.    Review of Systems  Neurological: Negative for tremors.   Blood pressure 131/81, pulse 79, temperature 98 F (36.7 C), temperature source Oral, resp. rate 20, height 5\' 7"  (1.702 m), weight 128.8 kg (283 lb 15.2 oz), SpO2 96.00%. Physical Exam   Alert, lying on bed Mental Status Examination/Evaluation:  Objective: Appearance: Fairly Groomed  Psychomotor Activity: Normal  Eye Contact:: Good  Speech: Normal Rate  Volume: Normal  Mood: ok Affect: ristricted  Thought Process: Clear rational goal orieneted  Orientation: 4/4 Thought Content: chronic AVH  Suicidal Thoughts: No  Homicidal Thoughts: No  Judgement: Impaired  Insight: poor   DIAGNOSIS:   AXIS I delerium d/o nos, mood d/o nos AXIS II def  AXIS III See medical history.  AXIS IV unknown  AXIS V 40   Treatment Plan Summary:   1. Likely deliriums and it is resolving now. Pt willing to consider LP now 2. abilify can be increased for agitation to 15 mg QD. 3. [facility] 3. Will continue to follow  Wonda Cerise 12/31/2011, 7:46 PM

## 2011-12-31 NOTE — Progress Notes (Signed)
  Echocardiogram 2D Echocardiogram has been performed.  Perel Hauschild FRANCES 12/31/2011, 12:32 PM

## 2012-01-01 ENCOUNTER — Inpatient Hospital Stay (HOSPITAL_COMMUNITY): Payer: Medicare Other

## 2012-01-01 DIAGNOSIS — R5381 Other malaise: Secondary | ICD-10-CM

## 2012-01-01 LAB — CBC
HCT: 34.4 % — ABNORMAL LOW (ref 39.0–52.0)
MCH: 24.7 pg — ABNORMAL LOW (ref 26.0–34.0)
MCHC: 34 g/dL (ref 30.0–36.0)
MCV: 72.7 fL — ABNORMAL LOW (ref 78.0–100.0)
RDW: 16.7 % — ABNORMAL HIGH (ref 11.5–15.5)

## 2012-01-01 LAB — CSF CELL COUNT WITH DIFFERENTIAL
RBC Count, CSF: 9 /mm3 — ABNORMAL HIGH
WBC, CSF: 8 /mm3 — ABNORMAL HIGH (ref 0–5)

## 2012-01-01 LAB — GLUCOSE, CAPILLARY: Glucose-Capillary: 104 mg/dL — ABNORMAL HIGH (ref 70–99)

## 2012-01-01 LAB — GRAM STAIN

## 2012-01-01 LAB — RPR: RPR Ser Ql: NONREACTIVE

## 2012-01-01 LAB — PROTEIN AND GLUCOSE, CSF: Glucose, CSF: 63 mg/dL (ref 43–76)

## 2012-01-01 MED ORDER — TRAMADOL HCL 50 MG PO TABS: 50.0000 mg | ORAL_TABLET | Freq: Four times a day (QID) | ORAL | Status: DC | PRN

## 2012-01-01 MED ORDER — ARIPIPRAZOLE 15 MG PO TABS: 15.0000 mg | ORAL_TABLET | Freq: Every day | ORAL | Status: DC

## 2012-01-01 MED ORDER — GABAPENTIN 600 MG PO TABS: 600.0000 mg | ORAL_TABLET | Freq: Three times a day (TID) | ORAL | Status: DC

## 2012-01-01 MED ORDER — DONEPEZIL HCL 5 MG PO TABS: 5.0000 mg | ORAL_TABLET | Freq: Every day | ORAL | Status: DC

## 2012-01-01 NOTE — Progress Notes (Signed)
FMTS Attending Note  Patient seen and examined by me, discussed with resident team and I agree with plan as documented by Dr. Casper Harrison, with following additions:  Patient is oriented to place, time when seen by me in the mid-morning.  He displays good insight into his condition and states that he prefers to return home upon discharge, rather than go to inpatient rehab.    Appreciate inpatient psychiatry consult, and medication modifications.    Given patient's interest in returning to his home, will discuss this plan with patient's family.  He has outpatient follow-up in the [place] in [place], [place], which includes neurology and psychiatry.  Home health PT/OT for strengthening.    He is getting LP today before discharge as part of workup for persistently elevated WBC count (cell count, glucose, gram stain and culture, to exclude remote possibility of subacute CNS infectious process that may be associated with delirium/mental status change).  To review at least the gram stain, cell count and glucose prior to issuing discharge order.  Paula Compton, MD

## 2012-01-01 NOTE — Plan of Care (Signed)
Problem: Discharge Progression Outcomes Goal: NIHSS documented on discharge Outcome: Not Met (add Reason) Stroke ruled out. NIHSS not needed.

## 2012-01-01 NOTE — Progress Notes (Signed)
Agreed with above 01/01/2012 Aaralynn Shepheard Elizabeth PTA [phone] pager [phone] office    

## 2012-01-01 NOTE — Consult Note (Signed)
Reason for Consult:AMS Referring Physician: unknown  Ronald Deruiter. is an 61 y.o. male.  HPI: Ronald Helming. is a 61 y.o. year old male with a past medical history significant for TIA, a seizure disorder and COPD on supplemental O2 via [place] 2-4 L presenting with a 3 day history of being disoriented and weakness.     Interval Hx:  Unable to walk. Calmer, no agitation.  LP was done and he agreed for that. Hoping to get discharged tomorrow and plans to have home PT.  Denies any manic or depressive symptoms. Able to sleep. Per pt his AVH are less intense now. Good mood.   Past Medical History  Diagnosis Date  . Gout     uric acid-9.7  . Degenerative disc disease   . Coronary atherosclerosis     mild  . Orthostasis   . HTN (hypertension)   . Hyperlipidemia   . Obesity   . S/P lumbar fusion   . History of colonoscopy   . Heart murmur   . Cardiomyopathy   . COPD (chronic obstructive pulmonary disease)   . Shortness of breath   . Recurrent upper respiratory infection (URI)   . Pneumonia   . Sleep apnea     uses cpap  . Diabetes mellitus   . Hepatitis     hep c  . GERD (gastroesophageal reflux disease)   . Seizures   . Headache   . TIA (transient ischemic attack)     Past Surgical History  Procedure Date  . 2d echo 12/22/2004  . Cardiac catheterization 04/02/2005  . Cpap 08/14/2005  . Lumbar disc surgery 07/03/1997    L5-S1; x2  . Mri 10/25/2005    mod to severe biforaminal narrowing L5-S1  . Persantine stress test 01/25/2005  . Pft 09/15/2005    mild to moderate obstructive airway disease  . Sleep study 08/14/2005    mod OSA/hypopnea    Family History  Problem Relation Age of Onset  . Coronary artery disease Mother   . Diabetes    . Hypertension Mother     Social History:  reports that he has never smoked. He has never used smokeless tobacco. He reports that he does not drink alcohol or use illicit drugs.  Allergies:  Allergies  Allergen Reactions  . Codeine  Itching and Other (See Comments)    Pt has this allergy in here but says he's not allergic to anything    Medications: I have reviewed the patient's current medications.  Results for orders placed during the hospital encounter of 12/28/11 (from the past 48 hour(s))  GLUCOSE, CAPILLARY     Status: Normal   Collection Time   12/31/11  6:54 AM      Component Value Range Comment   Glucose-Capillary 95  70 - 99 mg/dL   GLUCOSE, CAPILLARY     Status: Normal   Collection Time   12/31/11 11:15 AM      Component Value Range Comment   Glucose-Capillary 94  70 - 99 mg/dL   RPR     Status: Normal   Collection Time   12/31/11  1:00 PM      Component Value Range Comment   RPR NON REACTIVE  NON REACTIVE   GLUCOSE, CAPILLARY     Status: Abnormal   Collection Time   12/31/11  4:20 PM      Component Value Range Comment   Glucose-Capillary 119 (*) 70 - 99 mg/dL   GLUCOSE, CAPILLARY  Status: Abnormal   Collection Time   12/31/11  9:03 PM      Component Value Range Comment   Glucose-Capillary 155 (*) 70 - 99 mg/dL    Comment 1 Documented in Chart      Comment 2 Notify RN     GLUCOSE, CAPILLARY     Status: Abnormal   Collection Time   01/01/12  6:41 AM      Component Value Range Comment   Glucose-Capillary 103 (*) 70 - 99 mg/dL    Comment 1 Documented in Chart      Comment 2 Notify RN     CBC     Status: Abnormal   Collection Time   01/01/12  8:58 AM      Component Value Range Comment   WBC 16.0 (*) 4.0 - 10.5 K/uL    RBC 4.73  4.22 - 5.81 MIL/uL    Hemoglobin 11.7 (*) 13.0 - 17.0 g/dL    HCT 95.2 (*) 84.1 - 52.0 %    MCV 72.7 (*) 78.0 - 100.0 fL    MCH 24.7 (*) 26.0 - 34.0 pg    MCHC 34.0  30.0 - 36.0 g/dL    RDW 32.4 (*) 40.1 - 15.5 %    Platelets 453 (*) 150 - 400 K/uL   PROTIME-INR     Status: Normal   Collection Time   01/01/12  8:58 AM      Component Value Range Comment   Prothrombin Time 14.6  11.6 - 15.2 seconds    INR 1.12  0.00 - 1.49   GLUCOSE, CAPILLARY     Status: Abnormal     Collection Time   01/01/12 11:45 AM      Component Value Range Comment   Glucose-Capillary 103 (*) 70 - 99 mg/dL   PROTEIN AND GLUCOSE, CSF     Status: Abnormal   Collection Time   01/01/12  5:00 PM      Component Value Range Comment   Glucose, CSF 63  43 - 76 mg/dL    Total  Protein, CSF 66 (*) 15 - 45 mg/dL   CSF CELL COUNT WITH DIFFERENTIAL     Status: Abnormal   Collection Time   01/01/12  5:00 PM      Component Value Range Comment   Tube # 3      Color, CSF COLORLESS  COLORLESS    Appearance, CSF CLEAR (*) CLEAR    Supernatant NOT INDICATED      RBC Count, CSF 9 (*) 0 /cu mm    WBC, CSF 8 (*) 0 - 5 /cu mm    Segmented Neutrophils-CSF TOO FEW TO COUNT, SMEAR AVAILABLE FOR REVIEW  0 - 6 % RARE   Lymphs, CSF RARE  40 - 80 %    Monocyte-Macrophage-Spinal Fluid RARE  15 - 45 %   GRAM STAIN     Status: Normal   Collection Time   01/01/12  5:00 PM      Component Value Range Comment   Specimen Description CSF      Special Requests TUBE 2@1 .5CC      Gram Stain        Value: CYTOSPIN PREP     WBC PRESENT, PREDOMINANTLY MONONUCLEAR     NO ORGANISMS SEEN   Report Status 01/01/2012 FINAL     GLUCOSE, CAPILLARY     Status: Normal   Collection Time   01/01/12  5:28 PM      Component Value Range  Comment   Glucose-Capillary 99  70 - 99 mg/dL   GLUCOSE, CAPILLARY     Status: Abnormal   Collection Time   01/01/12  9:54 PM      Component Value Range Comment   Glucose-Capillary 104 (*) 70 - 99 mg/dL    Comment 1 Documented in Chart      Comment 2 Notify RN       Dg Fluoro Guide Lumbar Puncture  01/01/2012  *RADIOLOGY REPORT*  Clinical Data: Mental status changes.  FLUOROSCOPIC GUIDED LUMBAR PUNCTURE.  Technique: Informed written and verbal consent were obtained. Risks and benefits of the procedure including bleeding, infection, and headache were discussed.  A "time out" was performed.  The patient was then placed prone, minimally obliqued on the fluoroscopic table.  The L2-L3 level was  localized.  Skin was prepped and draped in a standard sterile fashion.  Skin and subcutaneous tissues were numbed with 1% lidocaine.  A ninecm spinal needle was inserted into the thecal sac. The needle placement  was complicated by prior surgery and patient body habitus.  The patient tolerated the procedure without immediate complications.  Fluoroscopic time: 6.1 minutes  Comparison: MRI brain of 12/30/2011  Findings:  Opening pressure of 9.0cm of clear CSF.  A total of approximately eight cc of CSF were obtained.  IMPRESSION: Difficult fluoroscopic guided lumbar puncture as described.  Original Report Authenticated By: Consuello Bossier, M.D.    Review of Systems  Neurological: Negative for tremors.   Blood pressure 142/87, pulse 75, temperature 98.3 F (36.8 C), temperature source Oral, resp. rate 20, height 5\' 7"  (1.702 m), weight 128.8 kg (283 lb 15.2 oz), SpO2 97.00%. Physical Exam    Alert, lying on bed Mental Status Examination/Evaluation:  Objective: Appearance: Fairly Groomed  Psychomotor Activity: Normal  Eye Contact:: Good  Speech: loud, slurred at times Volume: Normal  Mood: ok Affect: ristricted  Thought Process: Clear rational goal orieneted  Orientation: 4/4 Thought Content: chronic AVH , less now Suicidal Thoughts: No  Homicidal Thoughts: No  Judgement ; fair Insight poor   DIAGNOSIS:   AXIS I delerium d/o nos (resolving), mood d/o nos,  R/o Cognitive d/ nos AXIS II def  AXIS III See medical history.  AXIS IV unknown  AXIS V 40   Treatment Plan Summary:    -. Will continue to follow   Wonda Cerise 01/01/2012, 9:59 PM

## 2012-01-01 NOTE — Progress Notes (Addendum)
Pt. C/o "bladder filling up" and "I can't go." Bladder Scan obtained showing 350cc urine. Dr. Elwyn Reach notified; order received to wait one hour to see if pt. voids and in and out cath if he doesn't. Will monitor.  ~1610 pt. Voided at least 200cc. Will monitor.  Julianne Rice, RN

## 2012-01-01 NOTE — Progress Notes (Signed)
[facility] Progress Note  Patient name: [name]. Medical record number: [id number] Date of birth: [birth year] Age: 61 y.o. Gender: male    LOS: 4 days   Primary Care Provider: Rodman Pickle, MD  Subjective: Pt seen at bedside this morning. Pt states he feels well. Understands LP is scheduled for today, eager for results/further workup to be done so he can go home. States he doesn't want to do inpatient rehab and would prefer home health. Some feelings of "filling up with urine" with retention, but reports relief after voiding. ROS: Specifically denies any abdominal or chest pain, SOB, weakness/numbness. No gouty pain in ankles/legs as on previous days.  Objective: Vital signs in last 24 hours: Temp:  [98 F (36.7 C)-98.5 F (36.9 C)] 98.3 F (36.8 C) (07/01 1012) Pulse Rate:  [70-79] 76  (07/01 1012) Resp:  [20] 20  (07/01 1012) BP: (119-155)/(78-86) 122/78 mmHg (07/01 1012) SpO2:  [96 %-98 %] 97 % (07/01 1012)  Wt Readings from Last 3 Encounters:  12/28/11 283 lb 15.2 oz (128.8 kg)  11/30/11 289 lb 3.9 oz (131.2 kg)  10/23/11 300 lb (136.079 kg)   Scheduled Meds:    . allopurinol  300 mg Oral Daily  . amLODipine  10 mg Oral Daily  . ARIPiprazole  15 mg Oral Daily  . aspirin  81 mg Oral Daily  . atorvastatin  40 mg Oral QHS  . carvedilol  50 mg Oral BID WC  . donepezil  5 mg Oral QHS  . gabapentin  600 mg Oral TID AC & HS  . levETIRAcetam  1,500 mg Oral QHS  . levETIRAcetam  750 mg Oral Custom  . losartan  100 mg Oral Daily  . Tamsulosin HCl  0.4 mg Oral QPC breakfast  . white petrolatum      . DISCONTD: ARIPiprazole  10 mg Oral Daily   Continuous Infusions:    . sodium chloride 20 mL/hr at 12/31/11 0948   PRN Meds:.senna-docusate, traMADol  PE: General: Pt lying in bed, alert/orientedx4, appropriate in conversation in NAD HEENT: MMM  CV: RRR, no rub/murmur/gallop appreciated Lungs: normal WOB without wheezes on  RA Extremities: 2+ pulses throughout, moves all extremities equally Musc./Skel: Strength 5/5 bilaterally in all four extremities, no joint tenderness to bilateral ankles/knees Neuro: Alert to person, place, time, and circumstance. Good comprehension.  Labs/Studies:   Lab 01/01/12 0858 12/30/11 0809 12/29/11 0620 12/28/11 1135  WBC 16.0* 18.1* 19.6* --  HGB 11.7* 11.3* 11.5* --  HCT 34.4* 32.9* 33.2* --  PLT 453* 434* 424* --  NEUTOPHILPCT -- -- -- 85*  MONOPCT -- -- -- 6    Lab 12/30/11 0809 12/29/11 0620 12/28/11 2201 12/28/11 1135  NA 137 135 -- 134*  K 4.3 4.3 -- 4.5  CL 102 99 -- 98  CO2 22 23 -- 25  BUN 22 30* -- 38*  CREATININE 1.49* 1.93* 2.29* --  LABGLOM -- -- -- --  GLUCOSE 100* -- -- --  CALCIUM 9.4 9.6 -- 9.1    Imaging: 6/29, MRI Brain W/Wo Contrast:  12/30/2011  *RADIOLOGY REPORT*  Clinical Data:  TIA.  MRI HEAD WITHOUT AND WITH CONTRAST   IMPRESSION: Carotid bifurcation on the left appears normal.  Carotid bifurcation on the right shows smooth narrowing of the ICA through the bulb region.  Maximal stenosis is 50% by NASCET criteria.  Multiple stenoses of the distal vertebral arteries and basilar artery, potentially flow limiting.  Stenosis of the right middle  cerebral artery at the M1/M2 junction which could be flow limiting.  Original Report Authenticated By: Thomasenia Sales, M.D.   6/29, Carotid doppler: No ICA stenosis on LEFT, RIGHT side unable to be evaluated  6/28, MRI Brain Ltd W/o Cm: 12/29/2011  *RADIOLOGY REPORT*  Clinical Data:  Weakness.  Disorientation.  MRI HEAD WITHOUT CONTRAST MRA HEAD WITHOUT CONTRAST    IMPRESSION: All major vessels patent into the brain.  Limited detail because of the motion degradation.  Original Report Authenticated By: Thomasenia Sales, M.D.   Assessment/Plan: [name]. is a 61yo AA male with a past medical history significant for TIA, a seizure disorder and COPD presenting with waxing and waning altered mental status and  weakness in the setting of a potential gout flare. Admitted 6/27.  1. Dementia/Delerium: Waxing/waning pattern with uncertain etiology, most likely TIA with underlying vascular dementia and delerium. New stroke unlikely given MRI/MRA findings. Patient has a history of TIA. Symptoms also potentially due to respiratory depression/hypoventilation. Pt has hx of OSA, on CPAP. Ammonia normal on 6/27.  PLAN: Abilify increased to 15 mg daily per psychiatry recommendations. Aricept 5 mg started 6/30 per psychiatry recommendations, as well. Continue to monitor clinically.  2. CVA/TIA: CT negative on admission. Carotid doppler with no ICA stenosis L side, R side unable to be evaluated; MRI/MRA as above, stenosis of the R MCA and vertebrobasilar system. 2D Echo on 6/30 shows moderate LVH with normal systolic function (EF 50-55%), regional hypokenesis, and mild AR; no evidence of ASD or VSD. PLAN: No noticeable deficits on exam, 7/1. PT recommending inpt rehab, more from global deficits vs focal deficits. CIR evaluation for potential inpt rehab shows pt would likely benefit from inpt rehab, but pt states he would prefer home health. Will continue PT/OT, has been OOB to bedside toilet and up with PT. Will discuss further with patient and family about possible inpatient rehab. Social work also consulted for possible home health.   3. Seizure disorder: No evidence of seizure during this admission.  Keppra level mildly supratherapeutic on 6/28.   PLAN: Continue home Keppra, 1500 mg PO qHS.  4. HTN:  Home BP meds initially held due to stroke concern, now restarted, with improvement in BP. PLAN: Cont. home amlodipine, Carvedilol and Cozaar. Continue to monitor vitals.  5. Acute on chronic kidney injury: Baseline Cr between 1.5-2. Elevated to 3.27 on admission. Last Cr on 6/29 was 1.49. Suspect intrarenal injury from increased ibuprofen use in the setting of possible gout flare. Some improvement w/ fluids. Has been on  IVF, taking good PO. PLAN: Cr at baseline as of 6/30. Will continue KVO fluids, NS at 10-20 mL/h. Weekly Cr per pharmacy heparin protocol.  6. COPD: Per patient history. Likely contributing to overall mental status change. Stable with improved CXR on admission compared to previous one.   PLAN: Supplemental O2 as needed. Currently on RA.  7. Elevated WBC: Unclear etiology; pt with recent hospitalization for pneumonia with WBC never normalizing since that admission. With AMS, atypical meningitis is possible. Afebrile since admission. UA negative, CXR improved, blood cultures with NGTD, no nuchal rigidity or photophobia.  PLAN: LP today by IR. Will check CSF cell count, protein, glucose, Gram stain and culture. Will start empiric meningitis coverage if patient has a decline in mental status with fever.   8. Gout: Per patient history. Questionable flare based on pt-described HPI. Pt currently with no symptoms/complaints. PLAN: Continue home allopurinol, 300 mg PO daily.  FEN/GI:  Dysphagia  III diet, KVO fluids  Prophylaxis:  - Lovenox; being held 7/1 for LP, will restart after procedure. - Protonix   Dispo: Home with home health PT vs inpt rehab, as above; will discuss with family  LOS 4  Kellyjo Edgren, Cristal Deer, MD PGY-1, [facility] Family Medicine FPTS Intern pager: [phone] 01/01/2012 2:28 PM

## 2012-01-01 NOTE — Procedures (Signed)
*  RADIOLOGY REPORT* Clinical Data: Mental status changes. Fluoroscopic guided lumbar puncture.  Technique: Informed written and verbal consent were obtained.  Risks and benefits of the procedure including bleeding, infection, and headache were discussed.  A "time out" was performed.  The patient was then placed prone, minimally obliqued on the fluoroscopic table.  The L2-L3 level was localized.  Skin was prepped and draped in a standard sterile fashion.  Skin and subcutaneous tissues were numbed with 1% lidocaine.  A [nine]cm spinal needle was inserted into the thecal sac.  The needle placement  was complicated by prior surgery and patient body habitus.The patient tolerated the procedure without immediate complications. Fluoroscopic time: 6.1 minutes Comparison: MRI brain of 12/30/2011 Findings:  Opening pressure of 9.0cm of clear CSF.  A total of approximately eight cc of CSF were obtained. IMPRESSION: Difficult fluoroscopic guided lumbar puncture as described.

## 2012-01-01 NOTE — Clinical Social Work Psychosocial (Signed)
     Clinical Social Work Department BRIEF PSYCHOSOCIAL ASSESSMENT 01/01/2012  Patient:  [name], [name]     Account Number:  [id number]     Admit date:  12/28/2011  Clinical Social Worker:  Lourdes Sledge  Date/Time:  01/01/2012 02:29 PM  Referred by:  Physician  Date Referred:  01/01/2012 Referred for  Other - See comment   Other Referral:   CIR vs. Home vs. SNF   Interview type:  Patient Other interview type:    PSYCHOSOCIAL DATA Living Status:  WIFE Admitted from facility:   Level of care:   Primary support name:  Glenis Smoker [phone] Primary support relationship to patient:  SPOUSE Degree of support available:   Pt lives at home with his wife and 5 children. Pt stated he has significant help at home.    CURRENT CONCERNS Current Concerns  Post-Acute Placement   Other Concerns:    SOCIAL WORK ASSESSMENT / PLAN CSW received referral for possible placment.    CSW observed in pt notes that pt has been confused however he appears to be more oriented now and the psychiatrist notes states that pt is oriented x4. CSW visited pt room to discuss placement options. Pt stated he will not go anywhere but home. Pt stated that he walked down the hall today with PT and stated he had no pain. Pt stated his wife is supportive as well as his 5 young children. Pt stated his wife is agreeable to pt returning home. Pt stated he was not interested in placement. CSW made RNCM aware of pt request for [facility] services.    CSW left a message for pt wife to confirm pt will be able to return at dc. CSW will await a call back.   Assessment/plan status:  Psychosocial Support/Ongoing Assessment of Needs Other assessment/ plan:   Information/referral to community resources:   Pt declined discussing SNF options. CSW informed RNCM of pt request for [facility] services. CSW made pt aware that HH could be setup at dc. CSW provided pt with CSW contact information in case pt or family had any questions.     PATIENTS/FAMILYS RESPONSE TO PLAN OF CARE: Pt laying in bed alert and appeared oriented. Pt answered questions appropriately and was pleasant to speak to. Pt stated he was not interested in placement options as pt would be returning home at dc. Pt appreciative of visit however declines any CSW needs at this time.    CSW will still await a call  back from pt spouse to ensure pt able to return.

## 2012-01-01 NOTE — Consult Note (Signed)
Physical Medicine and Rehabilitation Consult Reason for Consult: Altered mental status/delirium Referring Physician: Dr. Sheffield Slider   HPI: [name]. is a 61 y.o. right-handed male with history of TIA, seizure disorder and COPD on supplemental oxygen admitted 12/28/2011 with altered mental status x3 days. Cranial CT scan showed no acute abnormalities as well his MRI of the brain being negative showing mild chronic small vessel disease. MRA of the neck on the right shows smooth narrowing of the ICA through the bulb region as well as multiple stenosis of distal vertebral arteries and basilar artery. Echocardiogram with ejection fraction of 55%. Patient remains on aspirin therapy. Continues with Keppra as prior to admission for history of seizure disorder. Followup psychiatry services for bouts of delirium with Abilify increased to 15 mg daily. Physical and occupational therapy ongoing with recommendations for physical medicine rehabilitation consult to consider inpatient rehabilitation services  Patient prefers to go home with home health. States his wife and children can help him at home  Review of Systems  Eyes: Positive for blurred vision.  Gastrointestinal: Positive for constipation.  Musculoskeletal: Positive for joint pain.  Neurological: Positive for seizures and headaches.  Psychiatric/Behavioral: Positive for memory loss.  All other systems reviewed and are negative.   Past Medical History  Diagnosis Date  . Gout     uric acid-9.7  . Degenerative disc disease   . Coronary atherosclerosis     mild  . Orthostasis   . HTN (hypertension)   . Hyperlipidemia   . Obesity   . S/P lumbar fusion   . History of colonoscopy   . Heart murmur   . Cardiomyopathy   . COPD (chronic obstructive pulmonary disease)   . Shortness of breath   . Recurrent upper respiratory infection (URI)   . Pneumonia   . Sleep apnea     uses cpap  . Diabetes mellitus   . Hepatitis     hep c  . GERD  (gastroesophageal reflux disease)   . Seizures   . Headache   . TIA (transient ischemic attack)    Past Surgical History  Procedure Date  . 2d echo 12/22/2004  . Cardiac catheterization 04/02/2005  . Cpap 08/14/2005  . Lumbar disc surgery 07/03/1997    L5-S1; x2  . Mri 10/25/2005    mod to severe biforaminal narrowing L5-S1  . Persantine stress test 01/25/2005  . Pft 09/15/2005    mild to moderate obstructive airway disease  . Sleep study 08/14/2005    mod OSA/hypopnea   Family History  Problem Relation Age of Onset  . Coronary artery disease Mother   . Diabetes    . Hypertension Mother    Social History:  reports that he has never smoked. He has never used smokeless tobacco. He reports that he does not drink alcohol or use illicit drugs. Allergies:  Allergies  Allergen Reactions  . Codeine Itching and Other (See Comments)    Pt has this allergy in here but says he's not allergic to anything   Medications Prior to Admission  Medication Sig Dispense Refill  . albuterol (VENTOLIN HFA) 108 (90 BASE) MCG/ACT inhaler Inhale 1-2 puffs into the lungs every 4 (four) hours as needed. For cough and wheeze      . allopurinol (ZYLOPRIM) 300 MG tablet Take 300 mg by mouth daily.       Marland Kitchen amLODipine (NORVASC) 10 MG tablet Take 10 mg by mouth daily.      . ARIPiprazole (ABILIFY) 10 MG tablet Take  5 mg by mouth daily.      Marland Kitchen aspirin 81 MG tablet Take 81 mg by mouth daily.       Marland Kitchen atorvastatin (LIPITOR) 80 MG tablet Take 40 mg by mouth at bedtime.      . carvedilol (COREG) 25 MG tablet Take 50 mg by mouth 2 (two) times daily with a meal.      . Cholecalciferol 2000 UNITS TBDP Take 1 capsule by mouth daily.      . cyclobenzaprine (FLEXERIL) 10 MG tablet Take 10 mg by mouth 3 (three) times daily as needed. For muscle spasms      . diazepam (VALIUM) 10 MG tablet Take 10 mg by mouth every 8 (eight) hours as needed. For spasms      . furosemide (LASIX) 40 MG tablet Take 40 mg by mouth daily.      Marland Kitchen  gabapentin (NEURONTIN) 300 MG capsule Take 600 mg by mouth 4 (four) times daily.      Marland Kitchen glipiZIDE (GLUCOTROL) 5 MG tablet Take 2.5 mg by mouth 2 (two) times daily before a meal.      . hydrochlorothiazide (HYDRODIURIL) 25 MG tablet Take 25 mg by mouth daily.      Marland Kitchen HYDROcodone-acetaminophen (VICODIN) 5-500 MG per tablet Take 1 tablet by mouth 2 (two) times daily as needed. For pain  Taken with oxycodone      . ibuprofen (ADVIL,MOTRIN) 200 MG tablet Take 600 mg by mouth every 6 (six) hours as needed. For pain      . levETIRAcetam (KEPPRA) 750 MG tablet Take 750-1,500 mg by mouth 3 (three) times daily. Takes 1 tablet (750 mg) in the morning, 1 tablet (750 mg) at noon, and 2 tablets (1500 mg) at bedtime      . lisinopril-hydrochlorothiazide (PRINZIDE,ZESTORETIC) 20-12.5 MG per tablet Take 1 tablet by mouth daily.      Marland Kitchen losartan (COZAAR) 100 MG tablet Take 100 mg by mouth daily.      . minoxidil (LONITEN) 2.5 MG tablet Take 2.5 mg by mouth daily.      Marland Kitchen omega-3 acid ethyl esters (LOVAZA) 1 G capsule Take 4 g by mouth daily.      Marland Kitchen omeprazole (PRILOSEC OTC) 20 MG tablet Take 20 mg by mouth daily.        Marland Kitchen oxycodone (OXY-IR) 5 MG capsule Take 15 mg by mouth every 6 (six) hours as needed.      . Tamsulosin HCl (FLOMAX) 0.4 MG CAPS Take 0.4 mg by mouth daily.      Marland Kitchen zolpidem (AMBIEN) 10 MG tablet Take 5 mg by mouth at bedtime as needed. For insomnia        Home: Home Living Lives With: Spouse (x 5 kids (6, 46,8, 2 and 35 yo )) Type of Home: House Home Access: Stairs to enter [employer] of Steps: 3 from the outside and 5 to the laundry Entrance Stairs-Rails: Left Home Layout: One level;Laundry or work area in basement [employer]: Health visitor: Standard Home Adaptive Equipment: Paediatric nurse with back;Straight cane;Walker - rolling Additional Comments: pt is a poor historian and question accuracy of the above information  Functional History: Prior  Function Able to Take Stairs?: Yes Driving: No Vocation: Retired Functional Status:  Mobility: Bed Mobility Bed Mobility: Supine to Sit Supine to Sit: 4: Min assist;With rails;HOB elevated (HOb 20 degrees) Transfers Transfers: Sit to Stand;Stand to Sit Sit to Stand: 3: Mod assist;From bed;With upper extremity assist Stand to Sit: 3:  Mod assist;With upper extremity assist;To bed Ambulation/Gait Ambulation/Gait Assistance: 4: Min assist Ambulation Distance (Feet): 30 Feet Assistive device: Straight cane Ambulation/Gait Assistance Details: One episode of left knee buckling with gait.  Gait Pattern: Step-through pattern;Decreased stride length;Trunk flexed;Left flexed knee in stance General Gait Details: Improved speed and stride with increased distance but becomes more flexed at trunk.    ADL: ADL Eating/Feeding: Performed;Set up Where Assessed - Eating/Feeding: Bed level Grooming: Performed;Wash/dry hands Where Assessed - Grooming: Unsupported sitting Upper Body Bathing: Performed;Chest;Right arm;Left arm;Abdomen;Minimal assistance Where Assessed - Upper Body Bathing: Unsupported sitting Lower Body Bathing: Performed;Maximal assistance Where Assessed - Lower Body Bathing: Supported sit to stand Upper Body Dressing: Performed;Set up Where Assessed - Upper Body Dressing: Unsupported sitting Lower Body Dressing: Performed;+1 Total assistance Where Assessed - Lower Body Dressing: Unsupported sitting Toilet Transfer: Performed;Moderate assistance Toilet Transfer Method: Sit to stand Toilet Transfer Equipment: Raised toilet seat with arms (or 3-in-1 over toilet) Equipment Used: Gait belt Transfers/Ambulation Related to ADLs: Pt provided cane per pt request then pt states "i am going to need to get a walker" Pt stand pivot only during session due to impulsive grabbing for unsafe environmental supports and c/o Lt leg pain due to gout ADL Comments: Pt agreeable to OT session and  performed bath at EOB , transfer to 3n1 and repositioned supine position.   Cognition: Cognition Overall Cognitive Status: Impaired Arousal/Alertness: Awake/alert Orientation Level: Oriented to person;Oriented to place;Oriented to time Attention: Alternating Alternating Attention: Impaired Alternating Attention Impairment: Verbal basic;Functional basic Memory: Impaired Memory Impairment: Decreased recall of new information;Decreased short term memory Awareness: Impaired Awareness Impairment: Anticipatory impairment;Emergent impairment Problem Solving: Impaired Problem Solving Impairment: Functional basic Safety/Judgment: Impaired Cognition Overall Cognitive Status: Impaired Area of Impairment: Attention;Memory;Following commands;Safety/judgement;Awareness of errors;Awareness of deficits;Problem solving Arousal/Alertness: Awake/alert Orientation Level: Disoriented to;Situation;Time Behavior During Session: Restless Current Attention Level: Sustained Memory Deficits: Pt reports working in 6 different areas of [employer] for 15 years. Pt reports being told that d/c planning is scheduled for today and that a nurse interrupted his plans. Pt poor historian. Following Commands: Follows one step commands consistently;Follows one step commands with increased time;Follows multi-step commands inconsistently Safety/Judgement: Decreased awareness of safety precautions;Decreased safety judgement for tasks assessed;Impulsive;Decreased awareness of need for assistance Safety/Judgement - Other Comments: no awareness to cognitive deficits and requires redirection to task. Pt attempting to pull up on IV pole. Awareness of Errors: Assistance required to identify errors made;Assistance required to correct errors made Problem Solving: Pt states "oh there is an odor" pt sitting on 3N1 with void of bowel (loose in texture). Pt unaware that odor was the result of the bowel movement. Cognition - Other Comments:  perserverating on d/c home today  Blood pressure 145/86, pulse 75, temperature 98 F (36.7 C), temperature source Oral, resp. rate 20, height 5\' 7"  (1.702 m), weight 128.8 kg (283 lb 15.2 oz), SpO2 97.00%. Physical Exam  Constitutional: He appears well-developed.  HENT:  Head: Normocephalic.  Neck: Neck supple. No thyromegaly present.  Cardiovascular: Regular rhythm.   Pulmonary/Chest: Breath sounds normal. No respiratory distress.  Abdominal: Bowel sounds are normal. He exhibits no distension. There is no tenderness.  Neurological: He is alert.       Patient makes good eye contact with examiner. He names person place date of birth. He was able to name all family members as well as his children's ages. He followed  three-step commands  Skin: Skin is warm and dry.  Psychiatric: He has a normal mood and affect.  her strength is 5/5 in bilateral deltoid, biceps, triceps, grip Motor strength is 4/5 in the left quadricep hip flexor 4 minus ankle dorsiflexor plantar flexor 5/5 in the right hip flexor knee extensor ankle dorsiflexor great toe extensor Sensation reduced in the left great toe He is modified independent with bed mobility sit to stand with min assist with knee and takes a few steps with minimal hand held assistance. Results for orders placed during the hospital encounter of 12/28/11 (from the past 24 hour(s))  GLUCOSE, CAPILLARY     Status: Normal   Collection Time   12/31/11  6:54 AM      Component Value Range   Glucose-Capillary 95  70 - 99 mg/dL  GLUCOSE, CAPILLARY     Status: Normal   Collection Time   12/31/11 11:15 AM      Component Value Range   Glucose-Capillary 94  70 - 99 mg/dL  RPR     Status: Normal   Collection Time   12/31/11  1:00 PM      Component Value Range   RPR NON REACTIVE  NON REACTIVE  GLUCOSE, CAPILLARY     Status: Abnormal   Collection Time   12/31/11  4:20 PM      Component Value Range   Glucose-Capillary 119 (*) 70 - 99 mg/dL  GLUCOSE, CAPILLARY      Status: Abnormal   Collection Time   12/31/11  9:03 PM      Component Value Range   Glucose-Capillary 155 (*) 70 - 99 mg/dL   Comment 1 Documented in Chart     Comment 2 Notify RN    GLUCOSE, CAPILLARY     Status: Abnormal   Collection Time   01/01/12  6:41 AM      Component Value Range   Glucose-Capillary 103 (*) 70 - 99 mg/dL   Comment 1 Documented in Chart     Comment 2 Notify RN     Mr Angiogram Neck W Wo Contrast  12/30/2011  *RADIOLOGY REPORT*  Clinical Data:  TIA.  MRI HEAD WITHOUT AND WITH CONTRAST  Technique:  Multiplanar, multiecho pulse sequences of the brain and surrounding structures were obtained without and with intravenous contrast.  Contrast: 20mL MULTIHANCE GADOBENATE DIMEGLUMINE 529 MG/ML IV SOLN  Comparison:  06/27 and 12/29/2011  Findings:  Again, the examination suffers from considerable motion degradation.  Diffusion imaging does not show any acute or subacute infarction.  The brainstem and cerebellum appear normal.  The cerebral hemispheres show a few old small vessel ischemic changes in the deep white matter and in the right basal ganglia region.  No cortical or large vessel territory insult.  No mass lesion, hemorrhage, hydrocephalus or extra-axial collection.  No pituitary mass.  No inflammatory sinus disease.  No skull or skull base lesion.  IMPRESSION: Motion degraded exam.  No acute finding.  Mild chronic small vessel change of the hemispheric deep white matter and right basal ganglia.  MRA NECK WITHOUT AND WITH CONTRAST  Technique:  Angiographic images of the neck were obtained using MRA technique without and with intravenous contrast.  Carotid stenosis measurements (when applicable) are obtained utilizing NASCET criteria, using the distal internal carotid diameter as the denominator.  Contrast: 20mL MULTIHANCE GADOBENATE DIMEGLUMINE 529 MG/ML IV SOLN  Findings:  Branching pattern of the brachiocephalic vessels from the arch is normal.  No origin stenoses.  Both common  carotid arteries are widely patent to their respective bifurcation.  On the left, carotid bifurcation appears normal  without narrowing or irregularity.  On the right, there is narrowing of the internal carotid artery through the region of the bulb.  Maximal narrowing is on the order of 50%.  This could be due to atherosclerotic disease most commonly.  Dissection is not excluded, but usually effects the more distal cervical internal carotid artery.  The examination includes the circle of Willis vessels.  There appears to be stenosis of the right middle cerebral artery 1 cm beyond its origin that could be flow limiting.  Both anterior cerebrals receiving supply from the left carotid circulation.  Vertebral artery origins are poorly evaluated because of motion degradation.  Vertebral artery origin stenoses are not excluded. Beyond the origin regions, the vessels are widely patent through the cervical region.  There are multiple stenoses of the distal vertebral arteries and basilar artery, not primarily evaluated.  IMPRESSION: Carotid bifurcation on the left appears normal.  Carotid bifurcation on the right shows smooth narrowing of the ICA through the bulb region.  Maximal stenosis is 50% by NASCET criteria.  Multiple stenoses of the distal vertebral arteries and basilar artery, potentially flow limiting.  Stenosis of the right middle cerebral artery at the M1/M2 junction which could be flow limiting.  Original Report Authenticated By: Thomasenia Sales, M.D.   Mr Laqueta Jean ZO Contrast  12/30/2011  *RADIOLOGY REPORT*  Clinical Data:  TIA.  MRI HEAD WITHOUT AND WITH CONTRAST  Technique:  Multiplanar, multiecho pulse sequences of the brain and surrounding structures were obtained without and with intravenous contrast.  Contrast: 20mL MULTIHANCE GADOBENATE DIMEGLUMINE 529 MG/ML IV SOLN  Comparison:  06/27 and 12/29/2011  Findings:  Again, the examination suffers from considerable motion degradation.  Diffusion imaging does  not show any acute or subacute infarction.  The brainstem and cerebellum appear normal.  The cerebral hemispheres show a few old small vessel ischemic changes in the deep white matter and in the right basal ganglia region.  No cortical or large vessel territory insult.  No mass lesion, hemorrhage, hydrocephalus or extra-axial collection.  No pituitary mass.  No inflammatory sinus disease.  No skull or skull base lesion.  IMPRESSION: Motion degraded exam.  No acute finding.  Mild chronic small vessel change of the hemispheric deep white matter and right basal ganglia.  MRA NECK WITHOUT AND WITH CONTRAST  Technique:  Angiographic images of the neck were obtained using MRA technique without and with intravenous contrast.  Carotid stenosis measurements (when applicable) are obtained utilizing NASCET criteria, using the distal internal carotid diameter as the denominator.  Contrast: 20mL MULTIHANCE GADOBENATE DIMEGLUMINE 529 MG/ML IV SOLN  Findings:  Branching pattern of the brachiocephalic vessels from the arch is normal.  No origin stenoses.  Both common carotid arteries are widely patent to their respective bifurcation.  On the left, carotid bifurcation appears normal without narrowing or irregularity.  On the right, there is narrowing of the internal carotid artery through the region of the bulb.  Maximal narrowing is on the order of 50%.  This could be due to atherosclerotic disease most commonly.  Dissection is not excluded, but usually effects the more distal cervical internal carotid artery.  The examination includes the circle of Willis vessels.  There appears to be stenosis of the right middle cerebral artery 1 cm beyond its origin that could be flow limiting.  Both anterior cerebrals receiving supply from the left carotid circulation.  Vertebral artery origins are poorly evaluated because of motion degradation.  Vertebral artery origin stenoses are not  excluded. Beyond the origin regions, the vessels are  widely patent through the cervical region.  There are multiple stenoses of the distal vertebral arteries and basilar artery, not primarily evaluated.  IMPRESSION: Carotid bifurcation on the left appears normal.  Carotid bifurcation on the right shows smooth narrowing of the ICA through the bulb region.  Maximal stenosis is 50% by NASCET criteria.  Multiple stenoses of the distal vertebral arteries and basilar artery, potentially flow limiting.  Stenosis of the right middle cerebral artery at the M1/M2 junction which could be flow limiting.  Original Report Authenticated By: Thomasenia Sales, M.D.    Assessment/Plan: Diagnosis: left lower extremity weakness due to chronic L5 radiculopathy combined with deconditioning and TIA 1. Does the need for close, 24 hr/day medical supervision in concert with the patient's rehab needs make it unreasonable for this patient to be served in a less intensive setting? Potentially 2. Co-Morbidities requiring supervision/potential complications: morbid obesity, hypertension, sensory and motor deficits due to radiculopathy left lower extremity 3. Due to safety, skin/wound care, disease management, pain management and patient education, does the patient require 24 hr/day rehab nursing? Potentially 4. Does the patient require coordinated care of a physician, rehab nurse, PT (1-2 hrs/day, 5 days/week) and OT (1-2 hrs/day, 55 days/week) to address physical and functional deficits in the context of the above medical diagnosis(es)? Potentially Addressing deficits in the following areas: balance, endurance, locomotion, strength, transferring, bathing, dressing and toileting 5. Can the patient actively participate in an intensive therapy program of at least 3 hrs of therapy per day at least 5 days per week? Yes 6. The potential for patient to make measurable gains while on inpatient rehab is good 7. Anticipated functional outcomes upon discharge from inpatient rehab are supervision to  modified independent mobility with PT, provision to modified independent ADL with OT, applicable with SLP. 8. Estimated rehab length of stay to reach the above functional goals is: 7-10 days 9. Does the patient have adequate social supports to accommodate these discharge functional goals? Potentially 10. Anticipated D/C setting: Home 11. Anticipated post D/C treatments: HH therapy 12. Overall Rehab/Functional Prognosis: good  RECOMMENDATIONS: This patient's condition is appropriate for continued rehabilitative care in the following setting: patient needs medical criteria for inpatient rehabilitation however he he feels his wife can provide the minimal assist levels that he currently requires and would rather go home. Patient has agreed to participate in recommended program. No Note that insurance prior authorization may be required for reimbursement for recommended care.  Comment:    01/01/2012

## 2012-01-01 NOTE — Progress Notes (Signed)
Physical Therapy Treatment Patient Details Name: [name]. MRN: [id number] DOB: 11-18-1950 Today's Date: 01/01/2012 Time: 4098-1191 PT Time Calculation (min): 30 min  PT Assessment / Plan / Recommendation Comments on Treatment Session  Pt was pleasant and talkative.  Initially he declined to walk, but agreed to treatment after encouragement.  Pt needs assistance for safety, and seems unaware of his deficits.  Pt would benefit from continued PT to increase endurance.  Continue with POC.    Follow Up Recommendations  Inpatient Rehab    Barriers to Discharge        Equipment Recommendations  Defer to next venue    Recommendations for Other Services Rehab consult  Frequency Min 3X/week   Plan Discharge plan remains appropriate;Frequency remains appropriate    Precautions / Restrictions Precautions Precautions: Fall Restrictions Weight Bearing Restrictions: No   Pertinent Vitals/Pain     Mobility  Bed Mobility Bed Mobility: Sit to Supine Supine to Sit: 4: Min assist;With rails;HOB elevated Sit to Supine: 4: Min guard;With rail;HOB elevated Details for Bed Mobility Assistance: Pt required rails and multiple efforts to move from side-lying to sitting.   Transfers Sit to Stand: 3: Mod assist;From elevated surface;With upper extremity assist;From bed Stand to Sit: 3: Mod assist;With upper extremity assist;To elevated surface;To bed Details for Transfer Assistance: Pt required extra time  (two tries) and mod assist to go to standing from EOB.  Pt needed assistance to balance as he extended hips into standing.   Pt required VC for correct hand placement on RW.  Ambulation/Gait Ambulation/Gait Assistance: 4: Min assist Ambulation Distance (Feet): 75 Feet Assistive device: Rolling walker Ambulation/Gait Assistance Details: Pt initially picked RW up as he stepped.  After VC for proper use of RW, he found it much easier.   Pt required VC for upright posture and to decrease weight  bearing in UE Gait Pattern: Step-through pattern;Decreased stride length;Trunk flexed Gait velocity: decreased gait speed General Gait Details: Pt required frequent cues for upright posture.  Pt. did not report any dizziness during ambulation.   Stairs: No Modified Rankin (Stroke Patients Only) Modified Rankin: Moderately severe disability    Exercises     PT Diagnosis:    PT Problem List:   PT Treatment Interventions:     PT Goals Acute Rehab PT Goals PT Goal: Supine/Side to Sit - Progress: Progressing toward goal PT Goal: Sit to Supine/Side - Progress: Progressing toward goal PT Goal: Sit to Stand - Progress: Progressing toward goal PT Goal: Stand to Sit - Progress: Progressing toward goal PT Goal: Ambulate - Progress: Progressing toward goal  Visit Information  Last PT Received On: 01/01/12 Assistance Needed: +1    Subjective Data  Subjective: Pt states that there have been mistakes made with his tests.  He states that once he does these tests, he will be discharged.    He states that she is on new medication which may make him woozy.     Cognition  Overall Cognitive Status: Impaired Area of Impairment: Attention;Safety/judgement;Awareness of deficits;Memory;Awareness of errors;Problem solving Arousal/Alertness: Awake/alert Orientation Level: Disoriented to;Time;Situation Behavior During Session: [facility] for tasks performed Memory Deficits: Pt reports that he had not been walked by nursing/PT.  Pt was confused about day of the week and which medical test have or should be being performed.   Following Commands: Follows one step commands consistently Safety/Judgement: Decreased awareness of safety precautions;Decreased safety judgement for tasks assessed;Impulsive;Decreased awareness of need for assistance Cognition - Other Comments: Pt seems to be unaware  of deficits.   Pt continues to discuss discharge and feels that he should be discharged home very soon.    Balance     End  of Session PT - End of Session Equipment Utilized During Treatment: Gait belt Activity Tolerance: Patient limited by fatigue Patient left: in bed;with bed alarm set;with call bell/phone within reach Nurse Communication: Mobility status   GP     Fausto Sampedro 01/01/2012, 1:38 PM

## 2012-01-01 NOTE — Discharge Instructions (Signed)
STROKE/TIA DISCHARGE INSTRUCTIONS SMOKING Cigarette smoking nearly doubles your risk of having a stroke & is the single most alterable risk factor  If you smoke or have smoked in the last 12 months, you are advised to quit smoking for your health.  Most of the excess cardiovascular risk related to smoking disappears within a year of stopping.  Ask you doctor about anti-smoking medications  [place] Quit Line: 1-800-QUIT NOW  Free Smoking Cessation Classes [phone]  CHOLESTEROL Know your levels; limit fat & cholesterol in your diet  Lipid Panel     Component Value Date/Time   CHOL 177 12/29/2011 0500   TRIG 135 12/29/2011 0500   HDL 39* 12/29/2011 0500   CHOLHDL 4.5 12/29/2011 0500   VLDL 27 12/29/2011 0500   LDLCALC 111* 12/29/2011 0500      Many patients benefit from treatment even if their cholesterol is at goal.  Goal: Total Cholesterol (CHOL) less than 160  Goal:  Triglycerides (TRIG) less than 150  Goal:  HDL greater than 40  Goal:  LDL (LDLCALC) less than 100   BLOOD PRESSURE American Stroke Association blood pressure target is less that 120/80 mm/Hg  Your discharge blood pressure is:  BP: 157/84 mmHg  Monitor your blood pressure  Limit your salt and alcohol intake  Many individuals will require more than one medication for high blood pressure  DIABETES (A1c is a blood sugar average for last 3 months) Goal HGBA1c is under 7% (HBGA1c is blood sugar average for last 3 months)  Diabetes: Diagnosis of diabetes:  A1c was not drawn this admission    Lab Results  Component Value Date   HGBA1C 6.6* 11/30/2011     Your HGBA1c can be lowered with medications, healthy diet, and exercise.  Check your blood sugar as directed by your physician  Call your physician if you experience unexplained or low blood sugars.  PHYSICAL ACTIVITY/REHABILITATION Goal is 30 minutes at least 4 days per week    Activity:   Increase activity slowly,  Activity decreases your risk of heart attack  and stroke and makes your heart stronger.  It helps control your weight and blood pressure; helps you relax and can improve your mood.  Participate in a regular exercise program.  Talk with your doctor about the best form of exercise for you (dancing, walking, swimming, cycling).  DIET/WEIGHT Goal is to maintain a healthy weight  Your discharge diet is: Dysphagia III liquids Your height is:  Height: 5\' 7"  (170.2 cm) Your current weight is: Weight: 128.8 kg (283 lb 15.2 oz) Your Body Mass Index (BMI) is:  BMI (Calculated): 44.6   Following the type of diet specifically designed for you will help prevent another stroke.  Your goal weight range is:  170-190  Your goal Body Mass Index (BMI) is 19-24.  Healthy food habits can help reduce 3 risk factors for stroke:  High cholesterol, hypertension, and excess weight.  RESOURCES Stroke/Support Group:  Call [phone]  they meet the 3rd Sunday of the month on the Rehab Unit at [facility], [place] ( no meetings June, July & Aug).  STROKE EDUCATION PROVIDED/REVIEWED AND GIVEN TO PATIENT Stroke warning signs and symptoms How to activate emergency medical system (call 911). Medications prescribed at discharge. Need for follow-up after discharge. Personal risk factors for stroke. Pneumonia vaccine given:   No Flu vaccine given:   No My questions have been answered, the writing is legible, and I understand these instructions.  I will adhere to these  goals & educational materials that have been provided to me after my discharge from the hospital.

## 2012-01-02 NOTE — Progress Notes (Signed)
Received call last night 1821pm from pt's RN stating that pt had been d/c and needed HHPT/OT arranged. He had told her he had no preference of agency when she was speaking to him about choices.  Advised her to confirm that the address and phone we have is correct and to let the patient know that [facility] was in his network for insurance coverage and that they would provide the care. Gave RN the [phone] phone for [facility] to give to the patient.   At 0840 this am, called [facility] rep for [facility] and gave referral.

## 2012-01-03 LAB — CULTURE, BLOOD (ROUTINE X 2): Culture: NO GROWTH

## 2012-01-03 NOTE — Discharge Summary (Signed)
FMTS Attending Note  Patient seen and examined by me on date of discharge.  I agree with plan for discharge, discussed with resident team. LP results with normal CSF glucose and unremarkable cell count and gram stain; will watch for culture results but this does not necessitate keeping him hospitalized.  Paula Compton, MD

## 2012-01-05 ENCOUNTER — Inpatient Hospital Stay: Payer: Medicare Other | Admitting: Sports Medicine

## 2012-01-05 LAB — CSF CULTURE W GRAM STAIN: Culture: NO GROWTH

## 2012-01-23 ENCOUNTER — Telehealth: Payer: Self-pay | Admitting: Family Medicine

## 2012-01-23 MED ORDER — LEVETIRACETAM 750 MG PO TABS: ORAL_TABLET | ORAL | Status: DC

## 2012-01-23 NOTE — Telephone Encounter (Signed)
Refill sent. Thank you! Adedamola Seto M. Essence Merle, M.D.

## 2012-01-23 NOTE — Telephone Encounter (Signed)
Therapist from [facility] because he is out of Keppra and needs a refill sent to [facility] on [address].  I informed the therapist to let patient know that in the future he needs to contact his pharmacy for refills.

## 2012-01-24 ENCOUNTER — Telehealth: Payer: Self-pay | Admitting: Family Medicine

## 2012-01-24 NOTE — Telephone Encounter (Signed)
Need verbal order or faxed back for shower seat for bathing.  You can reach her at [phone] ext [phone] or fax back order to [phone]

## 2012-01-25 ENCOUNTER — Other Ambulatory Visit (HOSPITAL_COMMUNITY): Payer: Self-pay | Admitting: Family Medicine

## 2012-01-25 NOTE — Telephone Encounter (Signed)
Verbal order given for shower seat for pt to [facility].

## 2012-01-25 NOTE — Telephone Encounter (Signed)
Please call number listed with a verbal order for requested bath seat. Thank you.

## 2012-02-09 ENCOUNTER — Other Ambulatory Visit (HOSPITAL_COMMUNITY): Payer: Self-pay | Admitting: Family Medicine

## 2012-02-09 ENCOUNTER — Other Ambulatory Visit: Payer: Self-pay | Admitting: Family Medicine

## 2012-02-27 ENCOUNTER — Other Ambulatory Visit (HOSPITAL_COMMUNITY): Payer: Self-pay | Admitting: Family Medicine

## 2012-03-15 ENCOUNTER — Other Ambulatory Visit: Payer: Self-pay | Admitting: Family Medicine

## 2012-05-15 ENCOUNTER — Telehealth: Payer: Self-pay | Admitting: Family Medicine

## 2012-05-15 NOTE — Telephone Encounter (Signed)
I dont see CHF on problem list, will forward to MD to be certain before callling [employer].

## 2012-05-15 NOTE — Telephone Encounter (Signed)
[employer] is calling to confirm that the patient does have the Dx of Congestive Heart Failure.  It is ok to leave a voicemail.

## 2012-05-15 NOTE — Telephone Encounter (Signed)
I do not see on problem list, and it does not look like we have treated him for CHF. He is also seen at [facility] in [place] for the majority of his care. Maybe they could check with them to see if they have treated him for CHF.  Thanks! Autry Droege M. Jaequan Propes, M.D.

## 2012-05-15 NOTE — Telephone Encounter (Signed)
Called and left message on Ashley's voicemail informing of message from MD.

## 2012-06-07 ENCOUNTER — Ambulatory Visit (INDEPENDENT_AMBULATORY_CARE_PROVIDER_SITE_OTHER): Payer: Medicare Other | Admitting: *Deleted

## 2012-06-07 DIAGNOSIS — Z23 Encounter for immunization: Secondary | ICD-10-CM

## 2012-06-17 ENCOUNTER — Ambulatory Visit: Payer: Medicare Other | Admitting: Family Medicine

## 2012-07-05 ENCOUNTER — Ambulatory Visit: Payer: Medicare Other | Admitting: Family Medicine

## 2012-07-15 ENCOUNTER — Ambulatory Visit (INDEPENDENT_AMBULATORY_CARE_PROVIDER_SITE_OTHER): Payer: Medicare Other | Admitting: Family Medicine

## 2012-07-15 ENCOUNTER — Encounter: Payer: Self-pay | Admitting: Family Medicine

## 2012-07-15 VITALS — BP 209/112 | HR 111 | Temp 98.4°F | Ht 67.0 in | Wt 297.0 lb

## 2012-07-15 DIAGNOSIS — I10 Essential (primary) hypertension: Secondary | ICD-10-CM

## 2012-07-15 DIAGNOSIS — M549 Dorsalgia, unspecified: Secondary | ICD-10-CM

## 2012-07-15 MED ORDER — TRAMADOL HCL 50 MG PO TABS: 50.0000 mg | ORAL_TABLET | Freq: Four times a day (QID) | ORAL | Status: DC | PRN

## 2012-07-15 MED ORDER — CYCLOBENZAPRINE HCL 5 MG PO TABS: 5.0000 mg | ORAL_TABLET | Freq: Three times a day (TID) | ORAL | Status: DC | PRN

## 2012-07-15 NOTE — Assessment & Plan Note (Signed)
Pts HTN is uncontrolled, and 209/112 at triage. Pt seems to have a good understanding and states that is "at stroke range, and needs to be 140." We discussed again that blood pressure that high is dangerous and he state he knows. Will take all of his medications and return for follow up visit in 1-2 days with his medications to review. At this time, I am not sure what changes could be made. Pt given red flag symptoms that should prompt immediate evaluation, and he is able to say these back to me.

## 2012-07-15 NOTE — Assessment & Plan Note (Signed)
Continues to have back pain, now with some muscle tension contributing to pain. Discussed weight loss as well as how important it is to follow up with his back doctor. Pt states that hydrocodone helped him the most, but I do not prescribe these medications. I have refilled the Tramadol and also given him Rx for flexeril. He should rest and let the muscles relax. Will follow up in 1-2 days. Pt agrees with this plan.

## 2012-07-15 NOTE — Patient Instructions (Signed)
It was good to see you today. I have prescribed Ultram and Flexeril for your back to take as needed. Please try to rest so those muscles can relax. You can also use an ice pack on your back to help with the tension as well.  For your blood pressures, it is difficult for me to adjust anything without knowing your medications. Please make an appointment to come back to see me or Dr. Raymondo Band in pharmacy clinic to review your blood pressure and bring your medications with you! This should be within the next 1-2 days.  If you have weakness, sudden vision changes, chest pain or increased shortness of breath, please go to the emergency room.  Cicely Ortner M. Zong Mcquarrie, M.D.

## 2012-07-15 NOTE — Progress Notes (Signed)
Patient ID: [name]., male   DOB: 18-Sep-1950, 62 y.o.   MRN: [id number] [facility] Jurgen Groeneveld M. Travares Nelles, MD Phone: [phone]   Subjective: HPI: Patient is a 62 y.o. male presenting to clinic today for follow up appointment for blood pressure. Concerns today include: Lower back pain  1. Hypertension-  Blood pressure at home: 141 systolic per patient Blood pressure today: 209/112 at triage. Recheck was 150/100 manually Taking Meds: Yes, does not miss any doses Side effects: None ROS: Slight headache, but states it is from back pain. Endorses visual changes due to glaucoma and cataracts, and will be going to [place] to be seen for that. No nausea, vomiting, chest pain, abdominal pain. Does also endorse shortness of breath. No leg edema  2. Back pain- Seen by "back specialist" at [facility]. Next appointment in 2 months. Taking IBU, Tylenol with no help. Sitting up helps his back but states he is in constant pain. Pt states he has gained more weight and that is making his back pain worse. No new injuries. Some left sided numbness and tingling, but not often. He states his pain is at an 8 right now. No fevers, no rashes, no neck stiffness. No loss of bowel or bladder.  3. Health Maintenance- Had flu shot this year. Going to [place] for a sleep study in [place] in Feb. Last labs at [facility] about 3 months ago but unsure if there are any abnormal values. Unable to review medications because he did not bring these with him and does not remember the names of what he is taking.    History Reviewed: Non smoker.  ROS: Please see HPI above.  Objective: Office vital signs reviewed.  Physical Examination:  General: Awake, alert. NAD. Very pleasant  HEENT: Atraumatic, normocephalic. Pupils equal and reactive. Neck: Supple, No masses palpated. No LAD Pulm: CTAB, no wheezes, no crackles Cardio: RRR, no murmurs appreciated Abdomen: Obese, +BS, soft, nontender, nondistended Back: Limited ROM in  all planes. TTP lumbar spine and paraspinal muscles. Extremities: 1+ pitting edema to the knee Neuro: Grossly intact. Normal gait, normal speech.  Assessment: 62 yo M follow up appointment   Plan: See Problem List and After Visit Summary

## 2012-07-18 ENCOUNTER — Ambulatory Visit: Payer: Medicare Other | Admitting: Family Medicine

## 2012-08-21 ENCOUNTER — Other Ambulatory Visit: Payer: Self-pay | Admitting: Family Medicine

## 2012-09-25 ENCOUNTER — Telehealth: Payer: Self-pay | Admitting: Family Medicine

## 2012-09-25 NOTE — Telephone Encounter (Signed)
[facility] on [address] has been trying to get refills on Flexeril and Tramadol since last week but I don't show that they have been received.  He is completely out of both.

## 2012-09-26 MED ORDER — CYCLOBENZAPRINE HCL 5 MG PO TABS: ORAL_TABLET | ORAL | Status: DC

## 2012-09-26 MED ORDER — TRAMADOL HCL 50 MG PO TABS: ORAL_TABLET | ORAL | Status: DC

## 2012-09-26 NOTE — Telephone Encounter (Signed)
Short term refills sent. I think Rx must have been sent to the [facility] since I have not received the request.  Please have patient make an appointment. He MUST have appointment with me before I will refill any other medications. His blood pressure was very high and he did not follow up.  Thank you, Jenaya Saar M. Kymiah Araiza, M.D.

## 2012-09-26 NOTE — Telephone Encounter (Signed)
LMOVM for pt to return call .[name], Jessica Dawn  

## 2012-10-15 ENCOUNTER — Other Ambulatory Visit: Payer: Self-pay | Admitting: *Deleted

## 2012-10-15 NOTE — Telephone Encounter (Signed)
Per my last refill note, and discussion with Mr. Whaling when he brought his child in to be seen last week, he must have an office visit for any additional refills.  Please advise him to make an office visit.  Thank you! Folashade Gamboa M. Avante Carneiro, M.D.

## 2012-11-01 ENCOUNTER — Other Ambulatory Visit: Payer: Self-pay | Admitting: Family Medicine

## 2012-11-04 ENCOUNTER — Other Ambulatory Visit: Payer: Self-pay | Admitting: *Deleted

## 2012-11-12 ENCOUNTER — Encounter: Payer: Medicare Other | Admitting: Family Medicine

## 2012-12-03 ENCOUNTER — Ambulatory Visit: Payer: Medicare Other | Admitting: Family Medicine

## 2012-12-04 ENCOUNTER — Ambulatory Visit (INDEPENDENT_AMBULATORY_CARE_PROVIDER_SITE_OTHER): Payer: Medicare Other | Admitting: Family Medicine

## 2012-12-04 ENCOUNTER — Encounter: Payer: Self-pay | Admitting: Family Medicine

## 2012-12-04 ENCOUNTER — Ambulatory Visit (HOSPITAL_COMMUNITY)
Admission: RE | Admit: 2012-12-04 | Discharge: 2012-12-04 | Disposition: A | Discharge status: Home / Self Care | Payer: Medicare Other | Source: Ambulatory Visit | Attending: Family Medicine | Admitting: Family Medicine

## 2012-12-04 ENCOUNTER — Other Ambulatory Visit: Payer: Self-pay | Admitting: Family Medicine

## 2012-12-04 VITALS — BP 147/92 | HR 73 | Temp 98.7°F | Ht 69.0 in | Wt 289.7 lb

## 2012-12-04 DIAGNOSIS — J40 Bronchitis, not specified as acute or chronic: Secondary | ICD-10-CM | POA: Insufficient documentation

## 2012-12-04 DIAGNOSIS — J449 Chronic obstructive pulmonary disease, unspecified: Secondary | ICD-10-CM

## 2012-12-04 DIAGNOSIS — R05 Cough: Secondary | ICD-10-CM

## 2012-12-04 DIAGNOSIS — R0602 Shortness of breath: Secondary | ICD-10-CM

## 2012-12-04 DIAGNOSIS — R509 Fever, unspecified: Secondary | ICD-10-CM | POA: Insufficient documentation

## 2012-12-04 DIAGNOSIS — I517 Cardiomegaly: Secondary | ICD-10-CM | POA: Insufficient documentation

## 2012-12-04 LAB — COMPREHENSIVE METABOLIC PANEL
ALT: 27 U/L (ref 0–53)
Albumin: 3.8 g/dL (ref 3.5–5.2)
CO2: 24 mEq/L (ref 19–32)
Glucose, Bld: 106 mg/dL — ABNORMAL HIGH (ref 70–99)
Potassium: 4.9 mEq/L (ref 3.5–5.3)
Sodium: 140 mEq/L (ref 135–145)
Total Bilirubin: 0.6 mg/dL (ref 0.3–1.2)
Total Protein: 6.4 g/dL (ref 6.0–8.3)

## 2012-12-04 LAB — GLUCOSE, CAPILLARY: Glucose-Capillary: 111 mg/dL — ABNORMAL HIGH (ref 70–99)

## 2012-12-04 LAB — CBC
Hemoglobin: 13.2 g/dL (ref 13.0–17.0)
MCH: 24.3 pg — ABNORMAL LOW (ref 26.0–34.0)
Platelets: 346 10*3/uL (ref 150–400)
RBC: 5.43 MIL/uL (ref 4.22–5.81)
WBC: 9.4 10*3/uL (ref 4.0–10.5)

## 2012-12-04 MED ORDER — PREDNISONE 20 MG PO TABS: 40.0000 mg | ORAL_TABLET | Freq: Every day | ORAL | Status: DC

## 2012-12-04 MED ORDER — ALBUTEROL SULFATE (2.5 MG/3ML) 0.083% IN NEBU
2.5000 mg | INHALATION_SOLUTION | Freq: Once | RESPIRATORY_TRACT | Status: AC
  Administered 2012-12-04: 2.5 mg via RESPIRATORY_TRACT

## 2012-12-04 MED ORDER — ALBUTEROL SULFATE HFA 108 (90 BASE) MCG/ACT IN AERS: 2.0000 | INHALATION_SPRAY | Freq: Four times a day (QID) | RESPIRATORY_TRACT | Status: DC | PRN

## 2012-12-04 MED ORDER — IPRATROPIUM BROMIDE 0.02 % IN SOLN
0.5000 mg | Freq: Once | RESPIRATORY_TRACT | Status: AC
  Administered 2012-12-04: 0.5 mg via RESPIRATORY_TRACT

## 2012-12-04 MED ORDER — LEVOFLOXACIN 500 MG PO TABS: 500.0000 mg | ORAL_TABLET | Freq: Every day | ORAL | Status: DC

## 2012-12-04 NOTE — Assessment & Plan Note (Signed)
Pt with documented Hx of COPD, but denies having any respiratory chronic condition. Symptoms are consistent with COPD exacerbation but there is concern for PNA also fluid overload (last ECHO in 2013 had normal EF, but inconclusive for diastolic dysfunction (pt is on Furosemide). Normal O2 sats at room air. P/ CXR , CBC and Cmet (bleeding and Hx of Hep C), BNP Albuterol Q4 Prednisone 40 mg daily for 5 days. Levaquin  Discussed signs of worsening condition that should prompt emergent evaluation. Since pt has hx of compliance, recommended close f/u.

## 2012-12-04 NOTE — Progress Notes (Signed)
Family Medicine Office Visit Note   Subjective:   Patient ID: [name]., male  DOB: [birth year], 62 y.o.. MRN: [id number]   Pt that comes today on same day appointment complaining of cough and SOB for the past 2 weeks and fever and chills starting 2 days ago.  Patient denies PMHx of respiratory illness, and reports increased difficulty breathing and progressively worsened productive cough with white mucus-like sputum. He also reports seen blood tinted sputum 2 days ago. Temperature reported is 101. Last night patient had an episode of epistasis  that self resolved. His wife called EMS and they came to take him to the hospital but pt refused to go. He reports to be nauseated and had one vomit this morning after a coughing spell. Denies diarrhea or other GI symptoms.  When getting his labs he reported to be dizzy, and denied focal weakness or numbness.   Review of Systems:  Pt denies chest pain, palpitations, headaches, orthopnea or LE swelling.   Objective:   Physical Exam: Gen:  NAD HEENT: Moist mucous membranes  CV: Regular rate and rhythm, no murmurs rubs or gallops. PULM: Diminish breath sounds. Presence of wheezes on both lung fields. Due to wheezing rales were hard to auscultate but the were some audible scattered mostly on right base. ABD: Soft, non tender, non distended, normal bowel sounds.  EXT: No edema Neuro: Alert and oriented x3. No focalization  Assessment & Plan:

## 2012-12-04 NOTE — Patient Instructions (Addendum)
Please take the medications as prescribed. Take albuterol 2 puffs every 4 hours. Prednisone and Levaquin.  I will call you with the labs results if they come back abnormal.  Make a f/u appointment for tomorrow.

## 2012-12-05 ENCOUNTER — Encounter: Payer: Self-pay | Admitting: Family Medicine

## 2012-12-05 ENCOUNTER — Ambulatory Visit (INDEPENDENT_AMBULATORY_CARE_PROVIDER_SITE_OTHER): Payer: Medicare Other | Admitting: Family Medicine

## 2012-12-05 VITALS — BP 192/104 | HR 83 | Temp 98.3°F | Ht 69.0 in | Wt 288.1 lb

## 2012-12-05 DIAGNOSIS — R0602 Shortness of breath: Secondary | ICD-10-CM

## 2012-12-05 DIAGNOSIS — G4730 Sleep apnea, unspecified: Secondary | ICD-10-CM

## 2012-12-05 DIAGNOSIS — J4489 Other specified chronic obstructive pulmonary disease: Secondary | ICD-10-CM

## 2012-12-05 DIAGNOSIS — J449 Chronic obstructive pulmonary disease, unspecified: Secondary | ICD-10-CM

## 2012-12-05 LAB — PRO B NATRIURETIC PEPTIDE: Pro B Natriuretic peptide (BNP): 629.3 pg/mL — ABNORMAL HIGH (ref <126)

## 2012-12-05 NOTE — Progress Notes (Signed)
  Subjective:    Patient ID: [name]., male    DOB: 02-10-1951, 62 y.o.   MRN: [id number]  HPI Followup abnormal chest x-ray, antibiotics started yesterday for bronchitis. He is feeling 100% better. Shortness of breath is improving. He continues to have some cough. No fever, sweats, chills. He has some mild lower extremity edema that he said for the last couple 3 weeks.   Review of Systems See history of present illness    Objective:   Physical Exam  Vital signs are reviewed notably pulse ox unremarried 94%. He uses 4 L nasal cannula at home. LUNGS: Mild expiratory wheeze bilaterally right greater than left. CREMASTER: Regular rate rhythm without murmur EXTREMITY: Trace pitting edema up to ankle bilaterally IMAGING: Chest x-ray reviewed. Read as bronchitis with patchy consolidation. By my interpretation of the x-rays, He has a lot of emphysematous changes but I don't see any focal consolidation.  Laboratory review B. N P6 40. He had an echo about a year ago that showed EF of 50-55% normal systolic function.    Assessment & Plan:  Bronchitis #2. Shortness of breath which has worsened over the last few weeks. Currently on steroid and antibiotic treatment started yesterday. #3. Abnormal chest x-ray. Diagnosis of COPD although these never been a smoker. PLAN: Was seen with Dr. Margot Ables in clinic today. We discussed options and I think it would be reasonable to let her go home and complete the antibiotic and steroid treatment with followup in one to 2 weeks with his PCP. In the interim I would like to get a CT of the chest given his recent worsening of dyspnea, his apparent diagnosis of COPD with no history of smoking. He has an appointment at the [facility] in about 7-8 weeks for a repeat echocardiogram. Given the fairly normal findings of his one 2013, I don't think that's going to be any new significant findings. Would like to further workup his pulmonary system. He required 4 L of oxygen at  home. Also has history on his problem list of sleep apnea. Unclear contribution of either of those to his current situation.

## 2012-12-05 NOTE — Patient Instructions (Addendum)
Thank you for coming in today You have a lung infection that is improving with the levaquin and albuterol Please go get your CT done today. This will help [place] get a better picture of your lungs. The nurses will help get this scheduled You may try some robitussin for your cough Continue all of your medicines as prescribed Please follow up with Dr. Mikel Cella in 2 weeks Please make sure to get your Echo done in 2 months.  Please call 24/7 if you have any further concerns.   Pneumonia, Adult Pneumonia is an infection of the lungs.  CAUSES Pneumonia may be caused by bacteria or a virus. Usually, these infections are caused by breathing infectious particles into the lungs (respiratory tract). SYMPTOMS   Cough.  Fever.  Chest pain.  Increased rate of breathing.  Wheezing.  Mucus production. DIAGNOSIS  If you have the common symptoms of pneumonia, your caregiver will typically confirm the diagnosis with a chest X-ray. The X-ray will show an abnormality in the lung (pulmonary infiltrate) if you have pneumonia. Other tests of your blood, urine, or sputum may be done to find the specific cause of your pneumonia. Your caregiver may also do tests (blood gases or pulse oximetry) to see how well your lungs are working. TREATMENT  Some forms of pneumonia may be spread to other people when you cough or sneeze. You may be asked to wear a mask before and during your exam. Pneumonia that is caused by bacteria is treated with antibiotic medicine. Pneumonia that is caused by the influenza virus may be treated with an antiviral medicine. Most other viral infections must run their course. These infections will not respond to antibiotics.  PREVENTION A pneumococcal shot (vaccine) is available to prevent a common bacterial cause of pneumonia. This is usually suggested for:  People over 29 years old.  Patients on chemotherapy.  People with chronic lung problems, such as bronchitis or emphysema.  People  with immune system problems. If you are over 65 or have a high risk condition, you may receive the pneumococcal vaccine if you have not received it before. In some countries, a routine influenza vaccine is also recommended. This vaccine can help prevent some cases of pneumonia.You may be offered the influenza vaccine as part of your care. If you smoke, it is time to quit. You may receive instructions on how to stop smoking. Your caregiver can provide medicines and counseling to help you quit. HOME CARE INSTRUCTIONS   Cough suppressants may be used if you are losing too much rest. However, coughing protects you by clearing your lungs. You should avoid using cough suppressants if you can.  Your caregiver may have prescribed medicine if he or she thinks your pneumonia is caused by a bacteria or influenza. Finish your medicine even if you start to feel better.  Your caregiver may also prescribe an expectorant. This loosens the mucus to be coughed up.  Only take over-the-counter or prescription medicines for pain, discomfort, or fever as directed by your caregiver.  Do not smoke. Smoking is a common cause of bronchitis and can contribute to pneumonia. If you are a smoker and continue to smoke, your cough may last several weeks after your pneumonia has cleared.  A cold steam vaporizer or humidifier in your room or home may help loosen mucus.  Coughing is often worse at night. Sleeping in a semi-upright position in a recliner or using a couple pillows under your head will help with this.  Get rest  as you feel it is needed. Your body will usually let you know when you need to rest. SEEK IMMEDIATE MEDICAL CARE IF:   Your illness becomes worse. This is especially true if you are elderly or weakened from any other disease.  You cannot control your cough with suppressants and are losing sleep.  You begin coughing up blood.  You develop pain which is getting worse or is uncontrolled with  medicines.  You have a fever.  Any of the symptoms which initially brought you in for treatment are getting worse rather than better.  You develop shortness of breath or chest pain. MAKE SURE YOU:   Understand these instructions.  Will watch your condition.  Will get help right away if you are not doing well or get worse. Document Released: 06/19/2005 Document Revised: 09/11/2011 Document Reviewed: 09/08/2010 [facility] Patient Information 2014 [place], [place].

## 2012-12-06 ENCOUNTER — Telehealth: Payer: Self-pay | Admitting: Family Medicine

## 2012-12-06 ENCOUNTER — Ambulatory Visit (HOSPITAL_COMMUNITY): Payer: Medicare Other

## 2012-12-06 NOTE — Telephone Encounter (Signed)
Mr. Schaum need you to call in a rx for fluid pills

## 2012-12-09 ENCOUNTER — Ambulatory Visit (HOSPITAL_COMMUNITY): Payer: Medicare Other

## 2012-12-11 ENCOUNTER — Ambulatory Visit (HOSPITAL_COMMUNITY)
Admission: RE | Admit: 2012-12-11 | Discharge: 2012-12-11 | Disposition: A | Discharge status: Home / Self Care | Payer: Medicare Other | Source: Ambulatory Visit | Attending: Family Medicine | Admitting: Family Medicine

## 2012-12-11 ENCOUNTER — Encounter (HOSPITAL_COMMUNITY): Payer: Self-pay

## 2012-12-11 DIAGNOSIS — J4489 Other specified chronic obstructive pulmonary disease: Secondary | ICD-10-CM | POA: Insufficient documentation

## 2012-12-11 DIAGNOSIS — J449 Chronic obstructive pulmonary disease, unspecified: Secondary | ICD-10-CM | POA: Insufficient documentation

## 2012-12-11 DIAGNOSIS — R05 Cough: Secondary | ICD-10-CM | POA: Insufficient documentation

## 2012-12-11 DIAGNOSIS — R079 Chest pain, unspecified: Secondary | ICD-10-CM | POA: Insufficient documentation

## 2012-12-11 DIAGNOSIS — R059 Cough, unspecified: Secondary | ICD-10-CM | POA: Insufficient documentation

## 2012-12-11 DIAGNOSIS — R0602 Shortness of breath: Secondary | ICD-10-CM | POA: Insufficient documentation

## 2012-12-11 DIAGNOSIS — I700 Atherosclerosis of aorta: Secondary | ICD-10-CM | POA: Insufficient documentation

## 2012-12-12 ENCOUNTER — Encounter: Payer: Self-pay | Admitting: Family Medicine

## 2012-12-12 MED ORDER — FUROSEMIDE 40 MG PO TABS: 40.0000 mg | ORAL_TABLET | Freq: Every day | ORAL | Status: DC

## 2012-12-12 NOTE — Telephone Encounter (Signed)
Lasix sent to pharmacy. I have encouraged [name] multiple times to keep future appointments with me.  [name], M.D.

## 2012-12-16 ENCOUNTER — Ambulatory Visit (INDEPENDENT_AMBULATORY_CARE_PROVIDER_SITE_OTHER): Payer: Medicare Other | Admitting: Family Medicine

## 2012-12-16 ENCOUNTER — Encounter: Payer: Self-pay | Admitting: Family Medicine

## 2012-12-16 VITALS — BP 164/110 | HR 118 | Temp 98.2°F | Wt 294.0 lb

## 2012-12-16 DIAGNOSIS — R0602 Shortness of breath: Secondary | ICD-10-CM

## 2012-12-16 DIAGNOSIS — J449 Chronic obstructive pulmonary disease, unspecified: Secondary | ICD-10-CM

## 2012-12-16 DIAGNOSIS — J4489 Other specified chronic obstructive pulmonary disease: Secondary | ICD-10-CM

## 2012-12-16 DIAGNOSIS — I10 Essential (primary) hypertension: Secondary | ICD-10-CM

## 2012-12-16 MED ORDER — CYCLOBENZAPRINE HCL 5 MG PO TABS: ORAL_TABLET | ORAL | Status: DC

## 2012-12-16 MED ORDER — TRAMADOL HCL 50 MG PO TABS: ORAL_TABLET | ORAL | Status: DC

## 2012-12-16 MED ORDER — ALBUTEROL SULFATE HFA 108 (90 BASE) MCG/ACT IN AERS: 2.0000 | INHALATION_SPRAY | Freq: Four times a day (QID) | RESPIRATORY_TRACT | Status: AC | PRN

## 2012-12-16 MED ORDER — FLUTICASONE-SALMETEROL 100-50 MCG/DOSE IN AEPB: 1.0000 | INHALATION_SPRAY | Freq: Two times a day (BID) | RESPIRATORY_TRACT | Status: DC

## 2012-12-16 NOTE — Assessment & Plan Note (Signed)
Patient with known COPD that is not controlled. Uses home O2 (Spo2 92% in clinic today), but does not have any inhalers at home. Will start on Advair BID for control, and add Albuterol prn for sob or wheezing. Continue O2. Since pt states he has never been evaluated by a lung doctor, will refer to Pulmonology for evaluation including PFT and optimization of medications. [facility] also [facility] since [facility]. RTC in 1 week for re-evaluation.

## 2012-12-16 NOTE — Progress Notes (Signed)
Patient ID: Winston Misner., male   DOB: [birth year], 62 y.o.   MRN: [id number]  [facility] [place] M. Aneya Daddona, MD Phone: [phone]   Subjective: HPI: Patient is a 62 y.o. male presenting to clinic today for follow up appointment for DOE.  Seen as a work-in visit a few weeks ago for DOE. Given steroids and antibiotics at that time, and was initially feeling better. Finished both last week. Had chest CT scan which showed scarring and atelectasis without focal findings/masses. Also followed by [facility] clinic, but we do not have good records from them. Does have an echo planned in August.   Today, he continues to have DOE. He does not use an inhaler at home at all. Wears O2 at home at 4L, which he has required daily. Does not have a CPAP but states he had a sleep study at the [place] and needs to get one. (We do not have those records) Worse with exertion, able to walk through his house and he is tired. Taking Lasix 40mg  daily.   HTN- elevated. Takes BP medication daily. Just took BP meds 20 mins ago. Amlodipine, losartan, carvedilol BID per his wife. Recheck was improved, but still elevated which is a problem for him at every visit.   History Reviewed: Non smoker. Health Maintenance: UTD at [facility], according to patient  ROS: Please see HPI above.  Objective: Office vital signs reviewed. There were no vitals taken for this visit.  Physical Examination:  General: Awake, alert. NAD. Breathing comfortably on my exam HEENT: Atraumatic, normocephalic. Some clear rhinorrhea Neck: No masses palpated. No LAD Pulm: No respiratory distress. CTAB, decreased air movement bilaterally but no wheezes, crackles or rhonchi appreviated Cardio: RRR, no murmurs appreciated Abdomen: Obese, +BS, soft, nontender, nondistended Extremities: Trace lower extremity edema Neuro: Grossly intact  Assessment: 62 y.o. male with COPD  Plan: See Problem List and After Visit Summary

## 2012-12-16 NOTE — Patient Instructions (Addendum)
[facility] through [facility].  Please take Advair two times per day everyday. Use Albuterol as needed for shortness of breath. I will see you back in one week to recheck your breathing.  Call me if you need anything.  Inez Stantz M. Wataru Mccowen, M.D.

## 2012-12-16 NOTE — Assessment & Plan Note (Signed)
Continue current regimen. Recheck BP improved from triage. Pt advised to take medication at least an hour before office visit so we can get a better measurement on BP. Pt agrees.

## 2012-12-22 ENCOUNTER — Emergency Department (HOSPITAL_COMMUNITY): Payer: Medicare Other

## 2012-12-22 ENCOUNTER — Emergency Department (HOSPITAL_COMMUNITY)
Admission: EM | Admit: 2012-12-22 | Discharge: 2012-12-22 | Disposition: A | Discharge status: Home / Self Care | Payer: Medicare Other | Attending: Emergency Medicine | Admitting: Emergency Medicine

## 2012-12-22 DIAGNOSIS — Z862 Personal history of diseases of the blood and blood-forming organs and certain disorders involving the immune mechanism: Secondary | ICD-10-CM | POA: Insufficient documentation

## 2012-12-22 DIAGNOSIS — R079 Chest pain, unspecified: Secondary | ICD-10-CM | POA: Insufficient documentation

## 2012-12-22 DIAGNOSIS — Z79899 Other long term (current) drug therapy: Secondary | ICD-10-CM | POA: Insufficient documentation

## 2012-12-22 DIAGNOSIS — Z8639 Personal history of other endocrine, nutritional and metabolic disease: Secondary | ICD-10-CM | POA: Insufficient documentation

## 2012-12-22 DIAGNOSIS — K219 Gastro-esophageal reflux disease without esophagitis: Secondary | ICD-10-CM | POA: Insufficient documentation

## 2012-12-22 DIAGNOSIS — R0602 Shortness of breath: Secondary | ICD-10-CM

## 2012-12-22 DIAGNOSIS — I2510 Atherosclerotic heart disease of native coronary artery without angina pectoris: Secondary | ICD-10-CM | POA: Insufficient documentation

## 2012-12-22 DIAGNOSIS — IMO0002 Reserved for concepts with insufficient information to code with codable children: Secondary | ICD-10-CM | POA: Insufficient documentation

## 2012-12-22 DIAGNOSIS — D72829 Elevated white blood cell count, unspecified: Secondary | ICD-10-CM | POA: Insufficient documentation

## 2012-12-22 DIAGNOSIS — E669 Obesity, unspecified: Secondary | ICD-10-CM | POA: Insufficient documentation

## 2012-12-22 DIAGNOSIS — Z8679 Personal history of other diseases of the circulatory system: Secondary | ICD-10-CM | POA: Insufficient documentation

## 2012-12-22 DIAGNOSIS — E785 Hyperlipidemia, unspecified: Secondary | ICD-10-CM | POA: Insufficient documentation

## 2012-12-22 DIAGNOSIS — Z885 Allergy status to narcotic agent status: Secondary | ICD-10-CM | POA: Insufficient documentation

## 2012-12-22 DIAGNOSIS — J4489 Other specified chronic obstructive pulmonary disease: Secondary | ICD-10-CM | POA: Insufficient documentation

## 2012-12-22 DIAGNOSIS — G4730 Sleep apnea, unspecified: Secondary | ICD-10-CM | POA: Insufficient documentation

## 2012-12-22 DIAGNOSIS — Z981 Arthrodesis status: Secondary | ICD-10-CM | POA: Insufficient documentation

## 2012-12-22 DIAGNOSIS — E119 Type 2 diabetes mellitus without complications: Secondary | ICD-10-CM | POA: Insufficient documentation

## 2012-12-22 DIAGNOSIS — M549 Dorsalgia, unspecified: Secondary | ICD-10-CM | POA: Insufficient documentation

## 2012-12-22 DIAGNOSIS — Z8701 Personal history of pneumonia (recurrent): Secondary | ICD-10-CM | POA: Insufficient documentation

## 2012-12-22 DIAGNOSIS — Z7982 Long term (current) use of aspirin: Secondary | ICD-10-CM | POA: Insufficient documentation

## 2012-12-22 DIAGNOSIS — Z9889 Other specified postprocedural states: Secondary | ICD-10-CM | POA: Insufficient documentation

## 2012-12-22 DIAGNOSIS — N289 Disorder of kidney and ureter, unspecified: Secondary | ICD-10-CM | POA: Insufficient documentation

## 2012-12-22 DIAGNOSIS — I10 Essential (primary) hypertension: Secondary | ICD-10-CM | POA: Insufficient documentation

## 2012-12-22 DIAGNOSIS — R569 Unspecified convulsions: Secondary | ICD-10-CM | POA: Insufficient documentation

## 2012-12-22 DIAGNOSIS — J449 Chronic obstructive pulmonary disease, unspecified: Secondary | ICD-10-CM | POA: Insufficient documentation

## 2012-12-22 DIAGNOSIS — Z8673 Personal history of transient ischemic attack (TIA), and cerebral infarction without residual deficits: Secondary | ICD-10-CM | POA: Insufficient documentation

## 2012-12-22 DIAGNOSIS — R011 Cardiac murmur, unspecified: Secondary | ICD-10-CM | POA: Insufficient documentation

## 2012-12-22 LAB — BASIC METABOLIC PANEL
BUN: 18 mg/dL (ref 6–23)
CO2: 27 mEq/L (ref 19–32)
Calcium: 9.6 mg/dL (ref 8.4–10.5)
Glucose, Bld: 102 mg/dL — ABNORMAL HIGH (ref 70–99)
Sodium: 140 mEq/L (ref 135–145)

## 2012-12-22 LAB — CBC
HCT: 38.3 % — ABNORMAL LOW (ref 39.0–52.0)
Hemoglobin: 13.1 g/dL (ref 13.0–17.0)
MCHC: 34.2 g/dL (ref 30.0–36.0)
MCV: 71.3 fL — ABNORMAL LOW (ref 78.0–100.0)

## 2012-12-22 LAB — POCT I-STAT TROPONIN I

## 2012-12-22 MED ORDER — ASPIRIN 81 MG PO CHEW
324.0000 mg | CHEWABLE_TABLET | Freq: Once | ORAL | Status: AC
  Administered 2012-12-22: 324 mg via ORAL
  Filled 2012-12-22: qty 4

## 2012-12-22 MED ORDER — ALBUTEROL SULFATE HFA 108 (90 BASE) MCG/ACT IN AERS
2.0000 | INHALATION_SPRAY | Freq: Four times a day (QID) | RESPIRATORY_TRACT | Status: DC
  Administered 2012-12-22: 2 via RESPIRATORY_TRACT
  Filled 2012-12-22 (×2): qty 6.7

## 2012-12-22 MED ORDER — TRAMADOL HCL 50 MG PO TABS: 50.0000 mg | ORAL_TABLET | Freq: Four times a day (QID) | ORAL | Status: DC | PRN

## 2012-12-22 NOTE — ED Notes (Signed)
PT reported SHOB started last night and was worse today.

## 2012-12-22 NOTE — ED Provider Notes (Signed)
History     CSN: [id number]  Arrival date & time 12/22/12  1456   First MD Initiated Contact with Patient 12/22/12 1501      Chief Complaint  Patient presents with  . Shortness of Breath    (Consider location/radiation/quality/duration/timing/severity/associated sxs/prior treatment) The history is provided by the patient and the EMS personnel.   as 62 year old male [facility] brought in by EMS. Past medical history significant for hypertension coronary artery disease and COPD. Patient was diagnosed by [facility] with pneumonia one week ago and started on antibiotics. Patient with increased shortness of breath last night and got worse today. Patient ran out of his albuterol inhaler 2 days ago. Patient feels better here in the emergency department. Patient also with complaint of intermittent chest pain left substernal for one week. Currently no pain.  Past Medical History  Diagnosis Date  . Gout     uric acid-9.7  . Degenerative disc disease   . Coronary atherosclerosis     mild  . Orthostasis   . HTN (hypertension)   . Hyperlipidemia   . Obesity   . S/P lumbar fusion   . History of colonoscopy   . Heart murmur   . Cardiomyopathy   . COPD (chronic obstructive pulmonary disease)   . Shortness of breath   . Recurrent upper respiratory infection (URI)   . Pneumonia   . Sleep apnea     uses cpap  . Diabetes mellitus   . Hepatitis     hep c  . GERD (gastroesophageal reflux disease)   . Seizures   . Headache(784.0)   . TIA (transient ischemic attack)     Past Surgical History  Procedure Laterality Date  . 2d echo  12/22/2004  . Cardiac catheterization  04/02/2005  . Cpap  08/14/2005  . Lumbar disc surgery  07/03/1997    L5-S1; x2  . Mri  10/25/2005    mod to severe biforaminal narrowing L5-S1  . Persantine stress test  01/25/2005  . Pft  09/15/2005    mild to moderate obstructive airway disease  . Sleep study  08/14/2005    mod OSA/hypopnea     Family History  Problem Relation Age of Onset  . Coronary artery disease Mother   . Diabetes    . Hypertension Mother     History  Substance Use Topics  . Smoking status: Never Smoker   . Smokeless tobacco: Never Used  . Alcohol Use: No      Review of Systems  Constitutional: Negative for fever.  HENT: Negative for congestion.   Eyes: Negative for redness.  Respiratory: Positive for shortness of breath.   Cardiovascular: Positive for chest pain.  Gastrointestinal: Negative for nausea, vomiting and abdominal pain.  Genitourinary: Negative for dysuria.  Musculoskeletal: Positive for back pain.  Skin: Negative for rash.  Neurological: Negative for headaches.  Hematological: Does not bruise/bleed easily.  Psychiatric/Behavioral: Negative for confusion.    Allergies  Codeine  Home Medications   Current Outpatient Rx  Name  Route  Sig  Dispense  Refill  . albuterol (PROAIR HFA) 108 (90 BASE) MCG/ACT inhaler   Inhalation   Inhale 2 puffs into the lungs every 6 (six) hours as needed for wheezing.   1 Inhaler   0   . allopurinol (ZYLOPRIM) 300 MG tablet   Oral   Take 300 mg by mouth daily.          Marland Kitchen amLODipine (NORVASC) 10 MG tablet  Oral   Take 10 mg by mouth daily.         Marland Kitchen aspirin 81 MG tablet   Oral   Take 81 mg by mouth daily.          Marland Kitchen atorvastatin (LIPITOR) 80 MG tablet   Oral   Take 40 mg by mouth at bedtime.         . carvedilol (COREG) 25 MG tablet   Oral   Take 100 mg by mouth every evening.          . Cholecalciferol 2000 UNITS TBDP   Oral   Take 1 capsule by mouth daily.         . cyclobenzaprine (FLEXERIL) 10 MG tablet   Oral   Take 10 mg by mouth 3 (three) times daily as needed for muscle spasms.         . diazepam (VALIUM) 10 MG tablet   Oral   Take 10 mg by mouth every 12 (twelve) hours as needed (pain).          . Fluticasone-Salmeterol (ADVAIR) 100-50 MCG/DOSE AEPB   Inhalation   Inhale 1 puff into the  lungs 2 (two) times daily.   1 each   3   . furosemide (LASIX) 40 MG tablet   Oral   Take 1 tablet (40 mg total) by mouth daily.   30 tablet   0   . gabapentin (NEURONTIN) 600 MG tablet   Oral   Take 600 mg by mouth 2 (two) times daily.         Marland Kitchen glipiZIDE (GLUCOTROL) 5 MG tablet   Oral   Take 2.5 mg by mouth 2 (two) times daily before a meal.         . ibuprofen (ADVIL,MOTRIN) 200 MG tablet   Oral   Take 600 mg by mouth every 6 (six) hours as needed. For pain         . levETIRAcetam (KEPPRA) 750 MG tablet   Oral   Take 750-1,500 mg by mouth 3 (three) times daily. 1 tablet in the morning; 1 tablet at lunch; 2 tablets in the evening         . losartan (COZAAR) 100 MG tablet   Oral   Take 100 mg by mouth daily.         Marland Kitchen omega-3 acid ethyl esters (LOVAZA) 1 G capsule   Oral   Take 4 g by mouth daily.         Marland Kitchen omeprazole (PRILOSEC OTC) 20 MG tablet   Oral   Take 20 mg by mouth daily.           Marland Kitchen oxyCODONE (ROXICODONE) 15 MG immediate release tablet   Oral   Take 15 mg by mouth every 8 (eight) hours as needed for pain.          . Tamsulosin HCl (FLOMAX) 0.4 MG CAPS   Oral   Take 0.4 mg by mouth daily.         . traMADol (ULTRAM) 50 MG tablet   Oral   Take 50 mg by mouth every 6 (six) hours as needed for pain.         . traMADol (ULTRAM) 50 MG tablet   Oral   Take 1 tablet (50 mg total) by mouth every 6 (six) hours as needed for pain.   15 tablet   0     BP 157/90  Pulse 110  Temp(Src) 99.3 F (37.4 C) (  Oral)  Resp 22  SpO2 95%  Physical Exam  Nursing note and vitals reviewed. Constitutional: He is oriented to person, place, and time. He appears well-developed and well-nourished. No distress.  HENT:  Head: Normocephalic and atraumatic.  Mouth/Throat: Oropharynx is clear and moist.  Eyes: Conjunctivae and EOM are normal. Pupils are equal, round, and reactive to light.  Neck: Normal range of motion. Neck supple.  Cardiovascular:  Normal rate, regular rhythm and normal heart sounds.   No murmur heard. Pulmonary/Chest: Effort normal and breath sounds normal. No respiratory distress.  Abdominal: Soft. Bowel sounds are normal. There is no tenderness.  Musculoskeletal: Normal range of motion. He exhibits no edema.  Neurological: He is alert and oriented to person, place, and time. No cranial nerve deficit. He exhibits normal muscle tone. Coordination normal.  Skin: Skin is warm. No rash noted.    ED Course  Procedures (including critical care time)  Labs Reviewed  CBC - Abnormal; Notable for the following:    WBC 14.5 (*)    HCT 38.3 (*)    MCV 71.3 (*)    MCH 24.4 (*)    RDW 16.8 (*)    All other components within normal limits  PRO B NATRIURETIC PEPTIDE - Abnormal; Notable for the following:    Pro B Natriuretic peptide (BNP) 826.7 (*)    All other components within normal limits  BASIC METABOLIC PANEL - Abnormal; Notable for the following:    Glucose, Bld 102 (*)    Creatinine, Ser 1.62 (*)    GFR calc non Af Amer 44 (*)    GFR calc Af Amer 51 (*)    All other components within normal limits  POCT I-STAT TROPONIN I   Dg Chest 2 View  12/22/2012   *RADIOLOGY REPORT*  Clinical Data: Short of breath  CHEST - 2 VIEW  Comparison: Chest radiograph 12/04/2012  Findings: Stable enlarged heart silhouette.  There is mild bibasilar atelectasis which is slightly improved compared to prior. No effusion or infiltrate.  IMPRESSION: Improvement in bibasilar atelectasis.   Original Report Authenticated By: Genevive Bi, M.D.     Date: 12/22/2012  Rate: 119  Rhythm: sinus tachycardia  QRS Axis: normal  Intervals: normal  ST/T Wave abnormalities: nonspecific ST/T changes  Conduction Disutrbances:none  Narrative Interpretation:   Old EKG Reviewed: changes noted EKG compared to 12/28/2011 no oral changes in the ST segments in V1 and V2 compared to then. However today's EKG does show T wave changes in the inferior  leads.  Results for orders placed during the hospital encounter of 12/22/12  CBC      Result Value Range   WBC 14.5 (*) 4.0 - 10.5 K/uL   RBC 5.37  4.22 - 5.81 MIL/uL   Hemoglobin 13.1  13.0 - 17.0 g/dL   HCT 82.9 (*) 56.2 - 13.0 %   MCV 71.3 (*) 78.0 - 100.0 fL   MCH 24.4 (*) 26.0 - 34.0 pg   MCHC 34.2  30.0 - 36.0 g/dL   RDW 86.5 (*) 78.4 - 69.6 %   Platelets 332  150 - 400 K/uL  PRO B NATRIURETIC PEPTIDE      Result Value Range   Pro B Natriuretic peptide (BNP) 826.7 (*) 0 - 125 pg/mL  BASIC METABOLIC PANEL      Result Value Range   Sodium 140  135 - 145 mEq/L   Potassium 4.5  3.5 - 5.1 mEq/L   Chloride 104  96 - 112 mEq/L  CO2 27  19 - 32 mEq/L   Glucose, Bld 102 (*) 70 - 99 mg/dL   BUN 18  6 - 23 mg/dL   Creatinine, Ser 1.61 (*) 0.50 - 1.35 mg/dL   Calcium 9.6  8.4 - 09.6 mg/dL   GFR calc non Af Amer 44 (*) >90 mL/min   GFR calc Af Amer 51 (*) >90 mL/min  POCT I-STAT TROPONIN I      Result Value Range   Troponin i, poc 0.08  0.00 - 0.08 ng/mL   Comment 3              1. Shortness of breath       MDM  [facility]. Patient was seen by them outpatient setting diagnosed with pneumonia one week ago started on antibiotics. Today's chest x-ray is shows no evidence of residual pneumonia. No evidence of congestive heart failure pulmonary edema. Patient also has had some chest pain intermittently for one week. Cardiac workup here is negative for any acute cardiac findings on EKG or by some troponin. Patient ran out of his albuterol inhaler a few days ago and this may be contributing to the shortness of breath. Patient will have that renewed and sent home with him. Patient will followup of with the family practice in the next few days. Patient does have a bit of a leukocytosis but as stated no evidence of pneumonia on chest x-ray patient has a little bit of renal insufficiency with a creatinine 1.62 which is actually improved for him. Precautions  provided and close followup will be important.        Shelda Jakes, MD 12/22/12 1900

## 2012-12-26 ENCOUNTER — Ambulatory Visit: Payer: Medicare Other | Admitting: Family Medicine

## 2013-01-14 ENCOUNTER — Institutional Professional Consult (permissible substitution): Payer: Medicare Other | Admitting: Pulmonary Disease

## 2013-02-12 ENCOUNTER — Ambulatory Visit: Payer: Medicare Other | Admitting: Family Medicine

## 2013-04-18 ENCOUNTER — Ambulatory Visit (INDEPENDENT_AMBULATORY_CARE_PROVIDER_SITE_OTHER): Payer: Medicare Other | Admitting: Cardiovascular Disease

## 2013-04-18 ENCOUNTER — Encounter: Payer: Self-pay | Admitting: Cardiovascular Disease

## 2013-04-18 VITALS — BP 150/120 | HR 82 | Ht 69.0 in | Wt 301.4 lb

## 2013-04-18 DIAGNOSIS — E1122 Type 2 diabetes mellitus with diabetic chronic kidney disease: Secondary | ICD-10-CM

## 2013-04-18 DIAGNOSIS — E1129 Type 2 diabetes mellitus with other diabetic kidney complication: Secondary | ICD-10-CM

## 2013-04-18 DIAGNOSIS — E7800 Pure hypercholesterolemia, unspecified: Secondary | ICD-10-CM

## 2013-04-18 DIAGNOSIS — N289 Disorder of kidney and ureter, unspecified: Secondary | ICD-10-CM

## 2013-04-18 DIAGNOSIS — E119 Type 2 diabetes mellitus without complications: Secondary | ICD-10-CM

## 2013-04-18 DIAGNOSIS — G4730 Sleep apnea, unspecified: Secondary | ICD-10-CM

## 2013-04-18 DIAGNOSIS — I10 Essential (primary) hypertension: Secondary | ICD-10-CM

## 2013-04-18 DIAGNOSIS — N183 Chronic kidney disease, stage 3 unspecified: Secondary | ICD-10-CM

## 2013-04-18 DIAGNOSIS — E785 Hyperlipidemia, unspecified: Secondary | ICD-10-CM

## 2013-04-18 DIAGNOSIS — I119 Hypertensive heart disease without heart failure: Secondary | ICD-10-CM

## 2013-04-18 MED ORDER — FUROSEMIDE 40 MG PO TABS: ORAL_TABLET | ORAL | Status: DC

## 2013-04-18 NOTE — Patient Instructions (Addendum)
Your physician recommends that you return for lab work  Prior to next visit fasting.  Your physician has requested that you have a lexiscan myoview. For further information please visit [email or web address] Please follow instruction sheet, as given.  Your physician has requested that you have an echocardiogram. Echocardiography is a painless test that uses sound waves to create images of your heart. It provides your doctor with information about the size and shape of your heart and how well your heart's chambers and valves are working. This procedure takes approximately one hour. There are no restrictions for this procedure.  Your physician has requested that you have a renal artery duplex. During this test, an ultrasound is used to evaluate blood flow to the kidneys. Allow one hour for this exam. Do not eat after midnight the day before and avoid carbonated beverages. Take your medications as you usually do.   Your physician has recommended you make the following change in your medication: increase the furosemide to 60 mg daily. This change has already been sent to the pharmacy.  Your physician recommends that you schedule a follow-up appointment in: 6 WEEKS.

## 2013-04-21 ENCOUNTER — Encounter (HOSPITAL_COMMUNITY): Payer: Self-pay | Admitting: *Deleted

## 2013-04-28 ENCOUNTER — Other Ambulatory Visit: Payer: Self-pay | Admitting: Family Medicine

## 2013-04-30 ENCOUNTER — Ambulatory Visit (HOSPITAL_COMMUNITY): Payer: Medicare Other

## 2013-05-03 ENCOUNTER — Encounter: Payer: Self-pay | Admitting: Cardiovascular Disease

## 2013-05-03 DIAGNOSIS — E1122 Type 2 diabetes mellitus with diabetic chronic kidney disease: Secondary | ICD-10-CM | POA: Insufficient documentation

## 2013-05-03 DIAGNOSIS — N183 Chronic kidney disease, stage 3 unspecified: Secondary | ICD-10-CM | POA: Insufficient documentation

## 2013-05-03 NOTE — Progress Notes (Signed)
Patient ID: [name]., male   DOB: Jan 23, 1951, 62 y.o.   MRN: [id number]     PATIENT PROFILE: [name] is a 62 year old African American male who is followed primarily at the [facility]. He presents now for evaluation of hypertension and increasing exertional shortness of breath.   HPI: [name] is a 62 year old male who has served in the [employer] and was in the [place] [employer] in the [employer].  He is referred by the [place] in [place]. The patient has a history of seizure disorder for which he takes Keppra. There is also a history of hypertension, diabetes mellitus, renal insufficiency, morbid obesity, hyperlipidemia, obstructive sleep apnea for which he has been utilizing CPAP therapy for 2 years, and a history of gout. He has posttraumatic stress disorder. There also is a history of hepatitis C as well as glaucoma. Apparently, laboratory from the [facility] this past summer showed creatinines in the 2-2.2 range. The patient has had difficult control blood pressure. He recently has experienced increasing episodes of shortness of breath with activity. He denies definitive chest pressure. He does admit to being told of having congestive heart failure. He presents now for evaluation.  Past Medical History  Diagnosis Date  . Gout     uric acid-9.7  . Degenerative disc disease   . Coronary atherosclerosis     mild  . Orthostasis   . HTN (hypertension)   . Hyperlipidemia   . Obesity   . S/P lumbar fusion   . History of colonoscopy   . Heart murmur   . Cardiomyopathy   . COPD (chronic obstructive pulmonary disease)   . Shortness of breath   . Recurrent upper respiratory infection (URI)   . Pneumonia   . Sleep apnea     uses cpap  . Diabetes mellitus   . Hepatitis     hep c  . GERD (gastroesophageal reflux disease)   . Seizures   . Headache(784.0)   . TIA (transient ischemic attack)     Past Surgical History  Procedure Laterality Date  . 2d echo  12/22/2004  . Cardiac  catheterization  04/02/2005  . Cpap  08/14/2005  . Lumbar disc surgery  07/03/1997    L5-S1; x2  . Mri  10/25/2005    mod to severe biforaminal narrowing L5-S1  . Persantine stress test  01/25/2005  . Pft  09/15/2005    mild to moderate obstructive airway disease  . Sleep study  08/14/2005    mod OSA/hypopnea    Allergies  Allergen Reactions  . Codeine Itching and Other (See Comments)    Pt has this allergy in here but says he's not allergic to anything    Current Outpatient Prescriptions  Medication Sig Dispense Refill  . albuterol (PROAIR HFA) 108 (90 BASE) MCG/ACT inhaler Inhale 2 puffs into the lungs every 6 (six) hours as needed for wheezing.  1 Inhaler  0  . allopurinol (ZYLOPRIM) 300 MG tablet Take 300 mg by mouth daily.       Marland Kitchen amLODipine (NORVASC) 10 MG tablet Take 10 mg by mouth daily.      Marland Kitchen aspirin 81 MG tablet Take 81 mg by mouth daily.       Marland Kitchen atorvastatin (LIPITOR) 80 MG tablet Take 40 mg by mouth at bedtime.      . carvedilol (COREG) 25 MG tablet Take 50 mg by mouth 2 (two) times daily with a meal.       . Cholecalciferol  2000 UNITS TBDP Take 1 capsule by mouth daily.      . cyclobenzaprine (FLEXERIL) 10 MG tablet Take 10 mg by mouth 3 (three) times daily as needed for muscle spasms.      . diazepam (VALIUM) 10 MG tablet Take 10 mg by mouth every 12 (twelve) hours as needed (pain).       Marland Kitchen gabapentin (NEURONTIN) 600 MG tablet Take 600 mg by mouth 2 (two) times daily.      Marland Kitchen glipiZIDE (GLUCOTROL) 5 MG tablet Take 2.5 mg by mouth 2 (two) times daily before a meal.      . ibuprofen (ADVIL,MOTRIN) 200 MG tablet Take 600 mg by mouth every 6 (six) hours as needed. For pain      . levETIRAcetam (KEPPRA) 750 MG tablet Take 750-1,500 mg by mouth 3 (three) times daily. 1 tablet in the morning; 1 tablet at lunch; 2 tablets in the evening      . losartan (COZAAR) 100 MG tablet Take 100 mg by mouth daily.      . minoxidil (LONITEN) 2.5 MG tablet Take 5 mg by mouth 2 (two) times daily.       Marland Kitchen omega-3 acid ethyl esters (LOVAZA) 1 G capsule Take 2 g by mouth 2 (two) times daily.       Marland Kitchen omeprazole (PRILOSEC OTC) 20 MG tablet Take 20 mg by mouth daily.        Marland Kitchen oxyCODONE (ROXICODONE) 15 MG immediate release tablet Take 15 mg by mouth every 8 (eight) hours as needed for pain.       . Tamsulosin HCl (FLOMAX) 0.4 MG CAPS Take 0.4 mg by mouth daily.      . traMADol (ULTRAM) 50 MG tablet Take 1 tablet (50 mg total) by mouth every 6 (six) hours as needed for pain.  15 tablet  0  . traZODone (DESYREL) 150 MG tablet Take 150 mg by mouth at bedtime.      Marland Kitchen ADVAIR DISKUS 100-50 MCG/DOSE AEPB INHALE 1 PUFF INTO THE LUNGS TWICE DAILY  60 each  0  . furosemide (LASIX) 40 MG tablet Take 1 & 1/2 tablet daily.  45 tablet  6   No current facility-administered medications for this visit.    Social history is notable in that he is married for 14 years. He has 5 children. He is disabled. He does not drink alcohol. There is no tobacco use. He does exercise 3 days per week doing stair climbing.  Family History  Problem Relation Age of Onset  . Coronary artery disease Mother   . Hypertension Mother   . Diabetes    . Hypertension Brother     ROS is negative for fever chills or night sweats. He does have a history of morbid obesity. He denies recent rashes. He denies visual changes. He does admit to significant shortness of breath with activity. He is unaware of any recent wheezing, but does have a history of wheezing for which she takes Provera HFA. He denies cough or sputum production. He denies chest tightness. He denies abdominal pain. He does have GERD. He does take Prilosec over-the-counter. He denies any blood in his stool or urine. He denies claudication symptoms. He does admit to significant swelling of his legs. He does have renal insufficiency felt to be class III. He does have posttraumatic stress disorder and is on Desyrel 150 mg. He has difficulty sleeping. He has been on CPAP therapy for  sleep apnea for 2 years. He does  have gout. He does admit to some arthritic discomfort. He does have a history of diabetes mellitus appear he is unaware of thyroid abnormalities. He does have a history of seizure disorder for which he takes Keppra. Other comprehensive 14 point system review is negative.  PE BP 150/120  Pulse 82  Ht 5\' 9"  (1.753 m)  Wt 301 lb 6.4 oz (136.714 kg)  BMI 44.49 kg/m2 Repeat blood pressure by me was 144/100. General: Alert, oriented, no distress. Morbidly obese. Skin: normal turgor, no rashes HEENT: Normocephalic, atraumatic. Pupils round and reactive; sclera anicteric; Fundi arteriolar narrowing. No hemorrhages or exudates. Nose without nasal septal hypertrophy Mouth/Parynx benign; Mallinpatti scale 3/4 Neck: Thick neck; No JVD, no carotid briuts Lungs: clear to ausculatation and percussion; no wheezing or rales Heart: RRR, s1 s2 normal 1/6 systolic murmur. Possible soft S4. No S3 gallop.  Abdomen: soft, nontender; no hepatosplenomehaly, BS+; abdominal aorta nontender and not dilated by palpation. Pulses 2+ Extremities: Mild leg edema no clubbinbg cyanosis, Homan's sign negative  Neurologic: grossly nonfocal Psychologic: Appropriate mood and affect. History of posttraumatic stress disorder. History of possible schizophrenia.    ECG: Normal sinus rhythm. Probable left atrial enlargement. T-wave abnormalities in leads 1 and L. V4 through V6. EKG from the [facility] on July 26, 2024shows more pronounced ST-T abnormalities inferolaterally as well as high laterally.  LABS:  BMET    Component Value Date/Time   NA 140 12/22/2012 1619   K 4.5 12/22/2012 1619   CL 104 12/22/2012 1619   CO2 27 12/22/2012 1619   GLUCOSE 102* 12/22/2012 1619   BUN 18 12/22/2012 1619   CREATININE 1.62* 12/22/2012 1619   CREATININE 1.94* 12/04/2012 1114   CALCIUM 9.6 12/22/2012 1619   GFRNONAA 44* 12/22/2012 1619   GFRAA 51* 12/22/2012 1619     Hepatic Function Panel     Component Value  Date/Time   PROT 6.4 12/04/2012 1114   ALBUMIN 3.8 12/04/2012 1114   AST 31 12/04/2012 1114   ALT 27 12/04/2012 1114   ALKPHOS 82 12/04/2012 1114   BILITOT 0.6 12/04/2012 1114   BILIDIR 0.2 07/07/2008 1117   IBILI 0.6 07/07/2008 1117     CBC    Component Value Date/Time   WBC 14.5* 12/22/2012 1619   RBC 5.37 12/22/2012 1619   RBC 4.87 12/02/2011 0705   HGB 13.1 12/22/2012 1619   HCT 38.3* 12/22/2012 1619   PLT 332 12/22/2012 1619   MCV 71.3* 12/22/2012 1619   MCH 24.4* 12/22/2012 1619   MCHC 34.2 12/22/2012 1619   RDW 16.8* 12/22/2012 1619   LYMPHSABS 1.7 12/28/2011 1135   MONOABS 1.2* 12/28/2011 1135   EOSABS 0.2 12/28/2011 1135   BASOSABS 0.0 12/28/2011 1135     BNP    Component Value Date/Time   PROBNP 826.7* 12/22/2012 1619    Lipid Panel     Component Value Date/Time   CHOL 177 12/29/2011 0500   TRIG 135 12/29/2011 0500   HDL 39* 12/29/2011 0500   CHOLHDL 4.5 12/29/2011 0500   VLDL 27 12/29/2011 0500   LDLCALC 111* 12/29/2011 0500     RADIOLOGY: No results found.   ASSESSMENT AND PLAN: My impression is that Mr. Goyer is a 62 year old morbidly obese African American male with stage III renal insufficiency, diabetes mellitus, history of seizure disorder, gallop, who is on multiple medications for blood pressure consisting of amlodipine 10 mg carvedilol furosemide 40 mg losartan 100 mg [place] 5 mg twice a day. He has significant blood  pressure still elevated today. He also has a history of hyperlipidemia on Lipitor. Presently, I'm recommending we obtain a 2-D echo Doppler study to evaluate systolic and diastolic heart function as well as the architecture. In light of his difficult to control blood pressure on multiple medications I am scheduling him for a renal duplex scan to evaluate for possible renal vascular etiology. In light of his significant dyspnea in this diabetic male I will schedule him for LexiScan Myoview study to make certain his symptoms or not ischemia mediated. I am rechecking  laboratory with a CBC, comprehensive metabolic panel, hemoglobin A1c, as well as lipid studies. I did review the extensive blood work already done at the [facility]. I will see him back in the office in approximately 4-6 weeks in followup of the above studies and further recommendations will be made at that time.   Lennette Bihari, MD, [facility] 05/03/2013 1:35 PM

## 2013-05-06 ENCOUNTER — Encounter: Payer: Self-pay | Admitting: Cardiovascular Disease

## 2013-05-08 ENCOUNTER — Ambulatory Visit (HOSPITAL_COMMUNITY)
Admission: RE | Admit: 2013-05-08 | Discharge: 2013-05-08 | Disposition: A | Discharge status: Home / Self Care | Payer: Medicare Other | Source: Ambulatory Visit | Attending: Cardiovascular Disease | Admitting: Cardiovascular Disease

## 2013-05-08 DIAGNOSIS — I10 Essential (primary) hypertension: Secondary | ICD-10-CM

## 2013-05-08 DIAGNOSIS — I119 Hypertensive heart disease without heart failure: Secondary | ICD-10-CM | POA: Insufficient documentation

## 2013-05-08 NOTE — Progress Notes (Signed)
Renal Duplex Completed. Esiquio Boesen, BS, RDMS, RVT  

## 2013-05-15 ENCOUNTER — Encounter (HOSPITAL_COMMUNITY): Payer: Self-pay | Admitting: *Deleted

## 2013-05-15 ENCOUNTER — Ambulatory Visit (HOSPITAL_COMMUNITY)
Admission: RE | Admit: 2013-05-15 | Discharge: 2013-05-15 | Disposition: A | Discharge status: Home / Self Care | Payer: Medicare Other | Source: Ambulatory Visit | Attending: Cardiovascular Disease | Admitting: Cardiovascular Disease

## 2013-05-15 DIAGNOSIS — I119 Hypertensive heart disease without heart failure: Secondary | ICD-10-CM

## 2013-05-22 ENCOUNTER — Encounter: Payer: Self-pay | Admitting: *Deleted

## 2013-05-27 ENCOUNTER — Other Ambulatory Visit (HOSPITAL_COMMUNITY): Payer: Self-pay | Admitting: Cardiovascular Disease

## 2013-05-27 ENCOUNTER — Ambulatory Visit: Payer: Medicare Other | Admitting: Cardiovascular Disease

## 2013-05-27 ENCOUNTER — Ambulatory Visit (HOSPITAL_COMMUNITY)
Admission: RE | Admit: 2013-05-27 | Discharge: 2013-05-27 | Disposition: A | Discharge status: Home / Self Care | Payer: Medicare Other | Source: Ambulatory Visit | Attending: Cardiovascular Disease | Admitting: Cardiovascular Disease

## 2013-05-27 DIAGNOSIS — I119 Hypertensive heart disease without heart failure: Secondary | ICD-10-CM

## 2013-05-27 MED ORDER — AMINOPHYLLINE 25 MG/ML IV SOLN
75.0000 mg | Freq: Once | INTRAVENOUS | Status: AC
  Administered 2013-05-27: 75 mg via INTRAVENOUS

## 2013-05-27 MED ORDER — REGADENOSON 0.4 MG/5ML IV SOLN
0.4000 mg | Freq: Once | INTRAVENOUS | Status: AC
  Administered 2013-05-27: 0.4 mg via INTRAVENOUS

## 2013-05-27 MED ORDER — TECHNETIUM TC 99M SESTAMIBI GENERIC - CARDIOLITE
10.0000 | Freq: Once | INTRAVENOUS | Status: AC | PRN
  Administered 2013-05-27: 10 via INTRAVENOUS

## 2013-05-27 MED ORDER — TECHNETIUM TC 99M SESTAMIBI GENERIC - CARDIOLITE
30.0000 | Freq: Once | INTRAVENOUS | Status: AC | PRN
  Administered 2013-05-27: 30 via INTRAVENOUS

## 2013-05-27 NOTE — Procedures (Addendum)
[facility] [address] [place] [address] [place] [address] [phone]  Cardiology Nuclear Med Study  [name]. is a 62 y.o. male     MRN : [id number]     DOB: 02-12-1951  Procedure Date: 05/27/2013  Nuclear Med Background Indication for Stress Test:  Evaluation for Ischemia and Abnormal EKG History:  COPD and cardiomyopathy;CHF;SEIZURES Cardiac Risk Factors: CVA, Family History - CAD, Hypertension, Lipids, Obesity and TIA  Symptoms:  DOE, Fatigue and SOB   Nuclear Pre-Procedure Caffeine/Decaff Intake:  7:00pm NPO After: 5:00am   IV Site: R Hand  IV 0.9% NS with Angio Cath:  22g  Chest Size (in):  48" IV Started by: Emmit Pomfret, RN  Height: 5\' 9"  (1.753 m)  Cup Size: n/a  BMI:  Body mass index is 44.43 kg/(m^2). Weight:  301 lb (136.533 kg)   Tech Comments:  N/A    Nuclear Med Study 1 or 2 day study: 1 day  Stress Test Type:  Lexiscan  Order Authorizing Provider:  Nicki Guadalajara, MD   Resting Radionuclide: Technetium 51m Sestamibi  Resting Radionuclide Dose: 9.9 mCi   Stress Radionuclide:  Technetium 43m Sestamibi  Stress Radionuclide Dose: 30.8 mCi           Stress Protocol Rest HR: 67 Stress HR:84  Rest BP: 132/86 Stress BP: 145/79  Exercise Time (min): n/a METS: n/a          Dose of Adenosine (mg):  n/a Dose of Lexiscan: 0.4 mg  Dose of Atropine (mg): n/a Dose of Dobutamine: n/a mcg/kg/min (at max HR)  Stress Test Technologist: Ernestene Mention, CCT Nuclear Technologist: Koren Shiver, CNMT   Rest Procedure:  Myocardial perfusion imaging was performed at rest 45 minutes following the intravenous administration of Technetium 26m Sestamibi. Stress Procedure:  The patient received IV Lexiscan 0.4 mg over 15-seconds.  Technetium 35m Sestamibi injected at 30-seconds.  Due to patient's shortness of breath and stomach pains, he was given IV Aminophylline 75 mg. Symptoms were resolved during recovery. There were no  significant changes with Lexiscan.  Quantitative spect images were obtained after a 45 minute delay.  Transient Ischemic Dilatation (Normal <1.22):  1.05 Lung/Heart Ratio (Normal <0.45):  0.32 QGS EDV:  116 ml QGS ESV:  69 ml LV Ejection Fraction: 41%  Signed by       Rest ECG: NSR-LVH  Stress ECG: No significant change from baseline ECG  QPS Raw Data Images:  Normal; no motion artifact; normal heart/lung ratio. Stress Images:  Normal homogeneous uptake in all areas of the myocardium. Rest Images:  Normal homogeneous uptake in all areas of the myocardium. Subtraction (SDS):  No evidence of ischemia.  Impression Exercise Capacity:  Lexiscan with no exercise. BP Response:  Normal blood pressure response. Clinical Symptoms:  No significant symptoms noted. ECG Impression:  No significant ST segment change suggestive of ischemia. Comparison with Prior Nuclear Study: Compared to prior studies, perfusion improved  Overall Impression:  Normal stress nuclear study.  LV Wall Motion:  Moderate decrease in LV fxn   Runell Gess, MD  05/27/2013 12:27 PM

## 2013-06-03 ENCOUNTER — Encounter: Payer: Self-pay | Admitting: Cardiovascular Disease

## 2013-06-03 ENCOUNTER — Ambulatory Visit (INDEPENDENT_AMBULATORY_CARE_PROVIDER_SITE_OTHER): Payer: Medicare Other | Admitting: Cardiovascular Disease

## 2013-06-03 VITALS — BP 138/72 | HR 80 | Ht 69.0 in | Wt 309.1 lb

## 2013-06-03 DIAGNOSIS — E7800 Pure hypercholesterolemia, unspecified: Secondary | ICD-10-CM

## 2013-06-03 DIAGNOSIS — R5381 Other malaise: Secondary | ICD-10-CM

## 2013-06-03 DIAGNOSIS — G4730 Sleep apnea, unspecified: Secondary | ICD-10-CM

## 2013-06-03 DIAGNOSIS — R609 Edema, unspecified: Secondary | ICD-10-CM

## 2013-06-03 DIAGNOSIS — N529 Male erectile dysfunction, unspecified: Secondary | ICD-10-CM

## 2013-06-03 DIAGNOSIS — N289 Disorder of kidney and ureter, unspecified: Secondary | ICD-10-CM

## 2013-06-03 DIAGNOSIS — R600 Localized edema: Secondary | ICD-10-CM | POA: Insufficient documentation

## 2013-06-03 DIAGNOSIS — F528 Other sexual dysfunction not due to a substance or known physiological condition: Secondary | ICD-10-CM

## 2013-06-03 MED ORDER — SILDENAFIL CITRATE 50 MG PO TABS: 50.0000 mg | ORAL_TABLET | Freq: Every day | ORAL | Status: DC | PRN

## 2013-06-03 MED ORDER — FUROSEMIDE 80 MG PO TABS: ORAL_TABLET | ORAL | Status: DC

## 2013-06-03 NOTE — Progress Notes (Signed)
Patient ID: [name]., male   DOB: Jul 20, 1950, 62 y.o.   MRN: [id number]     HPI: [name] is a 62 year old African American male who is followed primarily at the [facility] in [place], [place]. I had initially seen him on 05/03/2013  for evaluation of hypertension and increasing exertional shortness of breath.   [name] is a 62 year old male who has served in the [employer] and was in the [place] [employer] in the [employer].  The patient has a history of seizure disorder for which he takes Keppra. There is also a history of hypertension, diabetes mellitus, renal insufficiency, morbid obesity, hyperlipidemia, obstructive sleep apnea for which he has been utilizing CPAP therapy for 2 years, and a history of gout. He has posttraumatic stress disorder. There also is a history of hepatitis C as well as glaucoma. Apparently, laboratory from the [facility] this past summer showed creatinines in the 2-2.2 range. The patient has had difficult control blood pressure. He recently has experienced increasing episodes of shortness of breath with activity. He denies definitive chest pressure. He does admit to being told of having congestive heart failure.  Since I saw him, he did undergo a nuclear perfusion study. This revealed normal perfusion without evidence for a defect on rest and stress images; however, global ejection fraction was reduced at 41%. A renal duplex scan was essentially normal and revealed normal kidney size without evidence for renal artery stenosis. He never did have his followup laboratory nor his echo Doppler study.  Apparently the day before these were to be done he had another seizure activity and consequently did not have those studies performed. He presents now for evaluation. Patient also complains of significant difficulty with erectile function.  Past Medical History  Diagnosis Date  . Gout     uric acid-9.7  . Degenerative disc disease   . Coronary atherosclerosis    mild  . Orthostasis   . HTN (hypertension)   . Hyperlipidemia   . Obesity   . S/P lumbar fusion   . History of colonoscopy   . Heart murmur   . Cardiomyopathy   . COPD (chronic obstructive pulmonary disease)   . Shortness of breath   . Recurrent upper respiratory infection (URI)   . Pneumonia   . Sleep apnea     uses cpap  . Diabetes mellitus   . Hepatitis     hep c  . GERD (gastroesophageal reflux disease)   . Seizures   . Headache(784.0)   . TIA (transient ischemic attack)     Past Surgical History  Procedure Laterality Date  . 2d echo  12/22/2004  . Cardiac catheterization  04/02/2005  . Cpap  08/14/2005  . Lumbar disc surgery  07/03/1997    L5-S1; x2  . Mri  10/25/2005    mod to severe biforaminal narrowing L5-S1  . Persantine stress test  01/25/2005  . Pft  09/15/2005    mild to moderate obstructive airway disease  . Sleep study  08/14/2005    mod OSA/hypopnea    Allergies  Allergen Reactions  . Codeine Itching and Other (See Comments)    Pt has this allergy in here but says he's not allergic to anything    Current Outpatient Prescriptions  Medication Sig Dispense Refill  . ADVAIR DISKUS 100-50 MCG/DOSE AEPB INHALE 1 PUFF INTO THE LUNGS TWICE DAILY  60 each  0  . albuterol (PROAIR HFA) 108 (90 BASE) MCG/ACT inhaler Inhale 2 puffs  into the lungs every 6 (six) hours as needed for wheezing.  1 Inhaler  0  . allopurinol (ZYLOPRIM) 300 MG tablet Take 300 mg by mouth daily.       Marland Kitchen amLODipine (NORVASC) 10 MG tablet Take 10 mg by mouth daily.      Marland Kitchen aspirin 81 MG tablet Take 81 mg by mouth daily.       Marland Kitchen atorvastatin (LIPITOR) 80 MG tablet Take 40 mg by mouth at bedtime.      . carvedilol (COREG) 25 MG tablet Take 50 mg by mouth 2 (two) times daily with a meal.       . Cholecalciferol 2000 UNITS TBDP Take 1 capsule by mouth daily.      . cyclobenzaprine (FLEXERIL) 10 MG tablet Take 10 mg by mouth 3 (three) times daily as needed for muscle spasms.      . diazepam (VALIUM)  10 MG tablet Take 10 mg by mouth every 12 (twelve) hours as needed (pain).       Marland Kitchen gabapentin (NEURONTIN) 600 MG tablet Take 600 mg by mouth 2 (two) times daily.      Marland Kitchen glipiZIDE (GLUCOTROL) 5 MG tablet Take 2.5 mg by mouth 2 (two) times daily before a meal.      . ibuprofen (ADVIL,MOTRIN) 200 MG tablet Take 600 mg by mouth every 6 (six) hours as needed. For pain      . levETIRAcetam (KEPPRA) 750 MG tablet Take 750-1,500 mg by mouth 3 (three) times daily. 1 tablet in the morning; 1 tablet at lunch; 2 tablets in the evening      . losartan (COZAAR) 100 MG tablet Take 100 mg by mouth daily.      . minoxidil (LONITEN) 2.5 MG tablet Take 5 mg by mouth 2 (two) times daily.      Marland Kitchen omega-3 acid ethyl esters (LOVAZA) 1 G capsule Take 2 g by mouth 2 (two) times daily.       Marland Kitchen omeprazole (PRILOSEC OTC) 20 MG tablet Take 20 mg by mouth daily.        Marland Kitchen oxyCODONE (ROXICODONE) 15 MG immediate release tablet Take 15 mg by mouth every 8 (eight) hours as needed for pain.       . sildenafil (VIAGRA) 50 MG tablet Take 1 tablet (50 mg total) by mouth daily as needed for erectile dysfunction.  10 tablet  2  . Tamsulosin HCl (FLOMAX) 0.4 MG CAPS Take 0.4 mg by mouth daily.      . traMADol (ULTRAM) 50 MG tablet Take 1 tablet (50 mg total) by mouth every 6 (six) hours as needed for pain.  15 tablet  0  . traZODone (DESYREL) 150 MG tablet Take 150 mg by mouth at bedtime.      . furosemide (LASIX) 80 MG tablet Take 1 tablets in the morning 1/2 tablet at night  45 tablet  6   No current facility-administered medications for this visit.    Social history is notable in that he is married for 14 years. He has 5 children. He is disabled. He does not drink alcohol. There is no tobacco use. He does exercise 3 days per week doing stair climbing.  Family History  Problem Relation Age of Onset  . Coronary artery disease Mother   . Hypertension Mother   . Diabetes    . Hypertension Brother     ROS is negative for fever  chills or night sweats. He does have a history of morbid obesity.  He denies recent rashes. He denies visual changes. He does admit to significant shortness of breath with activity. He is unaware of any recent wheezing, but does have a history of wheezing intermittently and takes takes Proair HFA. He denies cough or sputum production. He denies chest tightness. He denies abdominal pain. He does have GERD. He does take Prilosec over-the-counter. He denies any blood in his stool or urine.  He continues to have inadequate sex drive but does does have significant difficulty with erectile function. He denies claudication symptoms. He does admit to significant swelling of his legs. He does have renal insufficiency felt to be class III. He does have posttraumatic stress disorder and is on Desyrel 150 mg. He has difficulty sleeping. He has been on CPAP therapy for sleep apnea for 2 years. He does have gout. He does admit to some arthritic discomfort. He does have a history of diabetes mellitus appear he is unaware of thyroid abnormalities. He does have a history of seizure disorder for which he takes Keppra. Other comprehensive 12 point system review is negative.  PE BP 138/72  Pulse 80  Ht 5\' 9"  (1.753 m)  Wt 309 lb 1.6 oz (140.207 kg)  BMI 45.63 kg/m2 Repeat blood pressure by me was 144/100. General: Alert, oriented, no distress. Morbidly obese. Skin: normal turgor, no rashes HEENT: Normocephalic, atraumatic. Pupils round and reactive; sclera anicteric; Fundi arteriolar narrowing. No hemorrhages or exudates. Nose without nasal septal hypertrophy Mouth/Parynx benign; Mallinpatti scale 3/4 Neck: Thick neck; No JVD, no carotid briuts Lungs: clear to ausculatation and percussion; no wheezing or rales Heart: RRR, s1 s2 normal 1/6 systolic murmur. Possible soft S4. No S3 gallop.  Abdomen: soft, nontender; no hepatosplenomehaly, BS+; abdominal aorta nontender and not dilated by palpation. Pulses 2+ Extremities:  1+ bilateral lower extremity edema ankles feet and pretibial region. no clubbinbg cyanosis, Homan's sign negative  Neurologic: grossly nonfocal Psychologic: Appropriate mood and affect. History of posttraumatic stress disorder. History of possible schizophrenia.    LABS:  BMET    Component Value Date/Time   NA 140 12/22/2012 1619   K 4.5 12/22/2012 1619   CL 104 12/22/2012 1619   CO2 27 12/22/2012 1619   GLUCOSE 102* 12/22/2012 1619   BUN 18 12/22/2012 1619   CREATININE 1.62* 12/22/2012 1619   CREATININE 1.94* 12/04/2012 1114   CALCIUM 9.6 12/22/2012 1619   GFRNONAA 44* 12/22/2012 1619   GFRAA 51* 12/22/2012 1619     Hepatic Function Panel     Component Value Date/Time   PROT 6.4 12/04/2012 1114   ALBUMIN 3.8 12/04/2012 1114   AST 31 12/04/2012 1114   ALT 27 12/04/2012 1114   ALKPHOS 82 12/04/2012 1114   BILITOT 0.6 12/04/2012 1114   BILIDIR 0.2 07/07/2008 1117   IBILI 0.6 07/07/2008 1117     CBC    Component Value Date/Time   WBC 14.5* 12/22/2012 1619   RBC 5.37 12/22/2012 1619   RBC 4.87 12/02/2011 0705   HGB 13.1 12/22/2012 1619   HCT 38.3* 12/22/2012 1619   PLT 332 12/22/2012 1619   MCV 71.3* 12/22/2012 1619   MCH 24.4* 12/22/2012 1619   MCHC 34.2 12/22/2012 1619   RDW 16.8* 12/22/2012 1619   LYMPHSABS 1.7 12/28/2011 1135   MONOABS 1.2* 12/28/2011 1135   EOSABS 0.2 12/28/2011 1135   BASOSABS 0.0 12/28/2011 1135     BNP    Component Value Date/Time   PROBNP 826.7* 12/22/2012 1619    Lipid Panel  Component Value Date/Time   CHOL 177 12/29/2011 0500   TRIG 135 12/29/2011 0500   HDL 39* 12/29/2011 0500   CHOLHDL 4.5 12/29/2011 0500   VLDL 27 12/29/2011 0500   LDLCALC 111* 12/29/2011 0500     RADIOLOGY: No results found.   ASSESSMENT AND PLAN:  [name] is a 62 year old morbidly obese African American male with stage III renal insufficiency, diabetes mellitus, history of seizure disorder who is on multiple medications for blood pressure consisting of amlodipine 10 mg carvedilol  furosemide 60 mg losartan 100 mg, carvedilol 50 mg 5 mg twice a day. He has significant blood pressure today is improved with his prior medication adjustments. However, he continues to have lower extremity edema. He never did have his followup laboratory will be very important to know his renal function. I am recommending that he undergo a followup laboratory as initially prescribed insisting of a CBC, cemented, TSH, hemoglobin A1c, and lipid studies. In addition, I'm also adding a TSH as well as a testosterone level. I recommend he increase his Lasix to 80 mg in the morning and 40 mg at 4 PM. I'm also scheduling him for his 2-D echo Doppler study which he has not yet had done. This would be helpful to reassess his LV function since on his nuclear scan ejection fraction calculated at 41%. I am giving him a prescription trial of Viagra 50 mg to see if this does improve some of his difficulty with erectile function. I will see him back in the office in 4-6 weeks for followup evaluation or sooner if problems arise.  Lennette Bihari, MD, [facility] 06/03/2013 2:59 PM

## 2013-06-03 NOTE — Patient Instructions (Addendum)
Your physician recommends that you schedule a follow-up appointment in: 4-6 weeks  Your physician recommends that you return for lab work in: TSH, testosterone  Your physician has recommended you make the following change in your medication: Take 80 mg of furosemide in the morning and 40 mg at night

## 2013-06-06 ENCOUNTER — Telehealth: Payer: Self-pay | Admitting: *Deleted

## 2013-06-06 DIAGNOSIS — E1122 Type 2 diabetes mellitus with diabetic chronic kidney disease: Secondary | ICD-10-CM

## 2013-06-06 DIAGNOSIS — E7800 Pure hypercholesterolemia, unspecified: Secondary | ICD-10-CM

## 2013-06-06 NOTE — Telephone Encounter (Signed)
Left message for patient to return call. Please tell him it has been over 1 year since he had his cholesterol checked and his Dr would like for him to come in for a lab visit. He also needs his A1C checked. Please have the patient come in fasting. The order has been put in he just needs an appointment.Nalee Lightle, Rodena Medin

## 2013-06-17 ENCOUNTER — Ambulatory Visit (HOSPITAL_COMMUNITY): Payer: Medicare Other

## 2013-06-25 ENCOUNTER — Inpatient Hospital Stay (HOSPITAL_COMMUNITY): Admission: RE | Admit: 2013-06-25 | Payer: Medicare Other | Source: Ambulatory Visit

## 2013-07-01 ENCOUNTER — Encounter: Payer: Self-pay | Admitting: Cardiovascular Disease

## 2013-07-01 ENCOUNTER — Other Ambulatory Visit: Payer: Self-pay | Admitting: Family Medicine

## 2013-07-01 ENCOUNTER — Ambulatory Visit (INDEPENDENT_AMBULATORY_CARE_PROVIDER_SITE_OTHER): Payer: Medicare Other | Admitting: Cardiovascular Disease

## 2013-07-01 VITALS — BP 162/124 | HR 93 | Ht 69.0 in | Wt 303.2 lb

## 2013-07-01 DIAGNOSIS — N183 Chronic kidney disease, stage 3 unspecified: Secondary | ICD-10-CM

## 2013-07-01 DIAGNOSIS — E1122 Type 2 diabetes mellitus with diabetic chronic kidney disease: Secondary | ICD-10-CM

## 2013-07-01 DIAGNOSIS — G4730 Sleep apnea, unspecified: Secondary | ICD-10-CM

## 2013-07-01 DIAGNOSIS — Z79899 Other long term (current) drug therapy: Secondary | ICD-10-CM

## 2013-07-01 DIAGNOSIS — R5381 Other malaise: Secondary | ICD-10-CM

## 2013-07-01 DIAGNOSIS — I10 Essential (primary) hypertension: Secondary | ICD-10-CM

## 2013-07-01 DIAGNOSIS — E1129 Type 2 diabetes mellitus with other diabetic kidney complication: Secondary | ICD-10-CM

## 2013-07-01 DIAGNOSIS — F528 Other sexual dysfunction not due to a substance or known physiological condition: Secondary | ICD-10-CM

## 2013-07-01 DIAGNOSIS — E782 Mixed hyperlipidemia: Secondary | ICD-10-CM

## 2013-07-01 DIAGNOSIS — E7800 Pure hypercholesterolemia, unspecified: Secondary | ICD-10-CM

## 2013-07-01 DIAGNOSIS — I25810 Atherosclerosis of coronary artery bypass graft(s) without angina pectoris: Secondary | ICD-10-CM

## 2013-07-01 MED ORDER — AZILSARTAN MEDOXOMIL 80 MG PO TABS: 80.0000 mg | ORAL_TABLET | Freq: Every day | ORAL | Status: DC

## 2013-07-01 NOTE — Progress Notes (Signed)
Patient ID: Ronald Huston., male   DOB: 1951-01-18, 62 y.o.   MRN: [id number]     HPI: [name] is a 62 year old African American male who is followed primarily at the [facility] in [place], [place]. I had initially seen him on 05/03/2013  for evaluation of hypertension and increasing exertional shortness of breath last saw him one month ago.  [name] is a 62 year old male who has served in the [employer] and was in the [place] [employer] in the [employer].  The patient has a history of seizure disorder for which he takes Keppra. There is also a history of hypertension, diabetes mellitus, renal insufficiency, morbid obesity, hyperlipidemia, obstructive sleep apnea for which he has been utilizing CPAP therapy for 2 years, and a history of gout. He has posttraumatic stress disorder. There also is a history of hepatitis C as well as glaucoma. Apparently, laboratory from the [facility] this past summer showed creatinines in the 2-2.2 range. The patient has had difficult control blood pressure. He recently has experienced increasing episodes of shortness of breath with activity. He denies definitive chest pressure. He does admit to being told of having congestive heart failure.  Since I saw him, he did undergo a nuclear perfusion study. This revealed normal perfusion without evidence for a defect on rest and stress images; however, global ejection fraction was reduced at 41%. A renal duplex scan was essentially normal and revealed normal kidney size without evidence for renal artery stenosis. He never did have his followup laboratory nor his echo Doppler study.  Apparently the day before these were to be done he had another seizure activity and consequently did not have those studies performed.  When I last saw [name], his blood pressure had improved with medication adjustments. I further titrated his Lasix to 80 mg in the morning and 40 mg in the late afternoon. I recommended that he undergo a 2-D  echo Doppler study to reassess LV function, but he states he developed the flu and never had the echo Doppler done. He also requested a trial of Viagra to help with his erectile dysfunction but he never had this filled due to cost. He has been trying to lose weight and admits to a 6 pound weight loss over the past month. He never did undergo followup laboratory which had been scheduled. He presents for evaluation. Of note, he states today he was rushing to get to the office. As result he did not take his morning medications.  Past Medical History  Diagnosis Date  . Gout     uric acid-9.7  . Degenerative disc disease   . Coronary atherosclerosis     mild  . Orthostasis   . HTN (hypertension)   . Hyperlipidemia   . Obesity   . S/P lumbar fusion   . History of colonoscopy   . Heart murmur   . Cardiomyopathy   . COPD (chronic obstructive pulmonary disease)   . Shortness of breath   . Recurrent upper respiratory infection (URI)   . Pneumonia   . Sleep apnea     uses cpap  . Diabetes mellitus   . Hepatitis     hep c  . GERD (gastroesophageal reflux disease)   . Seizures   . Headache(784.0)   . TIA (transient ischemic attack)     Past Surgical History  Procedure Laterality Date  . 2d echo  12/22/2004  . Cardiac catheterization  04/02/2005  . Cpap  08/14/2005  .  Lumbar disc surgery  07/03/1997    L5-S1; x2  . Mri  10/25/2005    mod to severe biforaminal narrowing L5-S1  . Persantine stress test  01/25/2005  . Pft  09/15/2005    mild to moderate obstructive airway disease  . Sleep study  08/14/2005    mod OSA/hypopnea    Allergies  Allergen Reactions  . Codeine Itching and Other (See Comments)    Pt has this allergy in here but says he's not allergic to anything    Current Outpatient Prescriptions  Medication Sig Dispense Refill  . ADVAIR DISKUS 100-50 MCG/DOSE AEPB INHALE 1 PUFF INTO THE LUNGS TWICE DAILY  60 each  0  . albuterol (PROAIR HFA) 108 (90 BASE) MCG/ACT inhaler  Inhale 2 puffs into the lungs every 6 (six) hours as needed for wheezing.  1 Inhaler  0  . allopurinol (ZYLOPRIM) 300 MG tablet Take 300 mg by mouth daily.       Marland Kitchen amLODipine (NORVASC) 10 MG tablet Take 10 mg by mouth daily.      Marland Kitchen aspirin 81 MG tablet Take 81 mg by mouth daily.       Marland Kitchen atorvastatin (LIPITOR) 80 MG tablet Take 40 mg by mouth at bedtime.      . carvedilol (COREG) 25 MG tablet Take 50 mg by mouth 2 (two) times daily with a meal.       . Cholecalciferol 2000 UNITS TBDP Take 1 capsule by mouth daily.      . cyclobenzaprine (FLEXERIL) 10 MG tablet Take 10 mg by mouth 3 (three) times daily as needed for muscle spasms.      . furosemide (LASIX) 80 MG tablet Take 1 tablets in the morning 1/2 tablet at night  45 tablet  6  . gabapentin (NEURONTIN) 600 MG tablet Take 600 mg by mouth 2 (two) times daily.      Marland Kitchen glipiZIDE (GLUCOTROL) 5 MG tablet Take 2.5 mg by mouth 2 (two) times daily before a meal.      . levETIRAcetam (KEPPRA) 750 MG tablet Take 750-1,500 mg by mouth 3 (three) times daily. 1 tablet in the morning; 1 tablet at lunch; 2 tablets in the evening      . losartan (COZAAR) 100 MG tablet Take 100 mg by mouth daily.      . minoxidil (LONITEN) 2.5 MG tablet Take 5 mg by mouth 2 (two) times daily.      Marland Kitchen omega-3 acid ethyl esters (LOVAZA) 1 G capsule Take 2 g by mouth 2 (two) times daily.       Marland Kitchen omeprazole (PRILOSEC OTC) 20 MG tablet Take 20 mg by mouth daily.        Marland Kitchen oxyCODONE (ROXICODONE) 15 MG immediate release tablet Take 15 mg by mouth every 8 (eight) hours as needed for pain.       . sildenafil (VIAGRA) 50 MG tablet Take 1 tablet (50 mg total) by mouth daily as needed for erectile dysfunction.  10 tablet  2  . Tamsulosin HCl (FLOMAX) 0.4 MG CAPS Take 0.4 mg by mouth daily.      . traMADol (ULTRAM) 50 MG tablet Take 1 tablet (50 mg total) by mouth every 6 (six) hours as needed for pain.  15 tablet  0  . traZODone (DESYREL) 150 MG tablet Take 150 mg by mouth at bedtime.        No current facility-administered medications for this visit.    Social history is notable in that he  is married for 14 years. He has 5 children. He is disabled. He does not drink alcohol. There is no tobacco use. He does exercise 3 days per week doing stair climbing.  Family History  Problem Relation Age of Onset  . Coronary artery disease Mother   . Hypertension Mother   . Diabetes    . Hypertension Brother     ROS is negative for fever chills or night sweats. He does have a history of morbid obesity. He denies recent rashes. He denies visual changes. He shortness of breath with activity has improved. He is unaware of any recent wheezing, but does have a history of wheezing intermittently and takes takes Proair HFA. He denies cough or sputum production. But he recently had upper respiratory infection/flu.  He denies chest tightness. He denies abdominal pain. He does have GERD. He does take Prilosec over-the-counter. He denies any blood in his stool or urine.  He continues to have inadequate sex drive but does does have significant difficulty with erectile function. He denies claudication symptoms. He does admit to significant swelling of his legs. He does have renal insufficiency felt to be class III. He does have posttraumatic stress disorder and is on Desyrel 150 mg. He has difficulty sleeping. He has been on CPAP therapy for sleep apnea for 2 years. He does have gout. He does admit to some arthritic discomfort. He does have a history of diabetes mellitus appear he is unaware of thyroid abnormalities. He does have a history of seizure disorder for which he takes Keppra. He has not had any further seizures over the past month.  Other comprehensive 14 point system review is negative.  PE BP 162/124  Pulse 93  Ht 5\' 9"  (1.753 m)  Wt 303 lb 3.2 oz (137.531 kg)  BMI 44.75 kg/m2 Repeat blood pressure by me was still significantly elevated at 164/110  General: Alert, oriented, no distress.  Morbidly obese. Skin: normal turgor, no rashes HEENT: Normocephalic, atraumatic. Pupils round and reactive; sclera anicteric; Fundi arteriolar narrowing. No hemorrhages or exudates. Nose without nasal septal hypertrophy Mouth/Parynx benign; Mallinpatti scale 3/4 Neck: Thick neck; No JVD, no carotid briuts; normal carotid Lungs: clear to ausculatation and percussion; no wheezing or rales Chest: No tenderness to palpation Heart: RRR, s1 s2 normal 1/6 systolic murmur. Possible soft S4. No S3 gallop.  Abdomen: soft, nontender; no hepatosplenomehaly, BS+; abdominal aorta nontender and not dilated by palpation. Back: No CVA tenderness Pulses 2+ Extremities: 1+ bilateral lower extremity edema ankles feet and pretibial region. no clubbinbg cyanosis, Homan's sign negative  Neurologic: grossly nonfocal Psychologic: Appropriate mood and affect. History of posttraumatic stress disorder. History of possible schizophrenia.  ECG: NSR at 93 with lateral T wave changes  LABS:  BMET    Component Value Date/Time   NA 140 12/22/2012 1619   K 4.5 12/22/2012 1619   CL 104 12/22/2012 1619   CO2 27 12/22/2012 1619   GLUCOSE 102* 12/22/2012 1619   BUN 18 12/22/2012 1619   CREATININE 1.62* 12/22/2012 1619   CREATININE 1.94* 12/04/2012 1114   CALCIUM 9.6 12/22/2012 1619   GFRNONAA 44* 12/22/2012 1619   GFRAA 51* 12/22/2012 1619     Hepatic Function Panel     Component Value Date/Time   PROT 6.4 12/04/2012 1114   ALBUMIN 3.8 12/04/2012 1114   AST 31 12/04/2012 1114   ALT 27 12/04/2012 1114   ALKPHOS 82 12/04/2012 1114   BILITOT 0.6 12/04/2012 1114   BILIDIR 0.2 07/07/2008 1117  IBILI 0.6 07/07/2008 1117     CBC    Component Value Date/Time   WBC 14.5* 12/22/2012 1619   RBC 5.37 12/22/2012 1619   RBC 4.87 12/02/2011 0705   HGB 13.1 12/22/2012 1619   HCT 38.3* 12/22/2012 1619   PLT 332 12/22/2012 1619   MCV 71.3* 12/22/2012 1619   MCH 24.4* 12/22/2012 1619   MCHC 34.2 12/22/2012 1619   RDW 16.8* 12/22/2012 1619    LYMPHSABS 1.7 12/28/2011 1135   MONOABS 1.2* 12/28/2011 1135   EOSABS 0.2 12/28/2011 1135   BASOSABS 0.0 12/28/2011 1135     BNP    Component Value Date/Time   PROBNP 826.7* 12/22/2012 1619    Lipid Panel     Component Value Date/Time   CHOL 177 12/29/2011 0500   TRIG 135 12/29/2011 0500   HDL 39* 12/29/2011 0500   CHOLHDL 4.5 12/29/2011 0500   VLDL 27 12/29/2011 0500   LDLCALC 111* 12/29/2011 0500     RADIOLOGY: No results found.   ASSESSMENT AND PLAN:  [name] is a 62 year old morbidly obese African American male with renal insufficiency, diabetes mellitus, history of seizure disorder who is on multiple medications for blood pressure consisting of amlodipine 10 mg,  Carvedilol 50 mg twice a day, furosemide 80 mg in the morning, and 40 mg in the afternoon, minoxidil 5 mg twice a day and losartan 100 mg. When I saw him one month ago, his blood pressure had initially significantly improved. His blood pressure today is still significantly elevated although he admits that he did not take his morning medications. At this point, I am electing to discontinue his losartan 100 mg which is a weak ARB therapy and in its place have given him samples of a Edarbi 80. In 2 weeks, he will undergo laboratory consisting of a CBC, CMP, hemoglobin A1c, TSH level, lipid panel and we will also obtain a testosterone level which she requested at last visit. We will also have him undergo a 2-D echo Doppler study to reassess systolic and diastolic function and a fax of hypertension on his heart. I recommended continued weight loss and exercise if possible. I will see him back in the office in 4-6 weeks for further evaluation and additional medication adjustments will be made at that time. Lennette Bihari, MD, [facility] 07/01/2013 8:49 AM

## 2013-07-01 NOTE — Patient Instructions (Addendum)
Your physician recommends that you schedule a follow-up appointment in: 4-6 weeks with Dr Tresa Endo  RESCHEDULE ECHO. Your physician has requested that you have an echocardiogram. Echocardiography is a painless test that uses sound waves to create images of your heart. It provides your doctor with information about the size and shape of your heart and how well your heart's chambers and valves are working. This procedure takes approximately one hour. There are no restrictions for this procedure.  HAVE LAB WORK DONE IN 2 WEEKS ( TSH TESTOSTERONE LEVEL CMET CBC HGBA1C )  STOP LOSARTIN

## 2013-07-02 NOTE — Telephone Encounter (Signed)
[facility] calling again requesting refill.  Advised I would send message to MD, but more than likely pt would need an appt for refills. [name], [name] Rochester

## 2013-07-02 NOTE — Telephone Encounter (Signed)
Gave patient short term refill. Will need appointment before any further refills.

## 2013-07-09 ENCOUNTER — Ambulatory Visit (HOSPITAL_COMMUNITY): Payer: Medicare Other

## 2013-07-18 ENCOUNTER — Other Ambulatory Visit: Payer: Self-pay | Admitting: Family Medicine

## 2013-07-28 ENCOUNTER — Telehealth (HOSPITAL_COMMUNITY): Payer: Self-pay | Admitting: *Deleted

## 2013-08-05 ENCOUNTER — Ambulatory Visit (HOSPITAL_COMMUNITY)
Admission: RE | Admit: 2013-08-05 | Discharge: 2013-08-05 | Disposition: A | Discharge status: Home / Self Care | Payer: Medicare Other | Source: Ambulatory Visit | Attending: Cardiovascular Disease | Admitting: Cardiovascular Disease

## 2013-08-05 DIAGNOSIS — I517 Cardiomegaly: Secondary | ICD-10-CM

## 2013-08-05 DIAGNOSIS — I119 Hypertensive heart disease without heart failure: Secondary | ICD-10-CM

## 2013-08-05 DIAGNOSIS — G4730 Sleep apnea, unspecified: Secondary | ICD-10-CM | POA: Insufficient documentation

## 2013-08-05 DIAGNOSIS — I129 Hypertensive chronic kidney disease with stage 1 through stage 4 chronic kidney disease, or unspecified chronic kidney disease: Secondary | ICD-10-CM | POA: Insufficient documentation

## 2013-08-05 DIAGNOSIS — E785 Hyperlipidemia, unspecified: Secondary | ICD-10-CM | POA: Insufficient documentation

## 2013-08-05 DIAGNOSIS — I079 Rheumatic tricuspid valve disease, unspecified: Secondary | ICD-10-CM | POA: Insufficient documentation

## 2013-08-05 DIAGNOSIS — N189 Chronic kidney disease, unspecified: Secondary | ICD-10-CM | POA: Insufficient documentation

## 2013-08-05 DIAGNOSIS — E119 Type 2 diabetes mellitus without complications: Secondary | ICD-10-CM | POA: Insufficient documentation

## 2013-08-05 DIAGNOSIS — I059 Rheumatic mitral valve disease, unspecified: Secondary | ICD-10-CM | POA: Insufficient documentation

## 2013-08-05 NOTE — Progress Notes (Signed)
2D Echo Performed 08/05/2013    Susa Bones, RCS  

## 2013-08-12 ENCOUNTER — Encounter: Payer: Self-pay | Admitting: Cardiovascular Disease

## 2013-08-12 ENCOUNTER — Ambulatory Visit (INDEPENDENT_AMBULATORY_CARE_PROVIDER_SITE_OTHER): Payer: Medicare Other | Admitting: Cardiovascular Disease

## 2013-08-12 VITALS — BP 168/134 | HR 103 | Ht 69.0 in | Wt 305.4 lb

## 2013-08-12 DIAGNOSIS — F528 Other sexual dysfunction not due to a substance or known physiological condition: Secondary | ICD-10-CM

## 2013-08-12 DIAGNOSIS — R5381 Other malaise: Secondary | ICD-10-CM

## 2013-08-12 DIAGNOSIS — R609 Edema, unspecified: Secondary | ICD-10-CM

## 2013-08-12 DIAGNOSIS — N183 Chronic kidney disease, stage 3 unspecified: Secondary | ICD-10-CM

## 2013-08-12 DIAGNOSIS — E7800 Pure hypercholesterolemia, unspecified: Secondary | ICD-10-CM

## 2013-08-12 DIAGNOSIS — R5383 Other fatigue: Secondary | ICD-10-CM

## 2013-08-12 DIAGNOSIS — G4730 Sleep apnea, unspecified: Secondary | ICD-10-CM

## 2013-08-12 DIAGNOSIS — R0989 Other specified symptoms and signs involving the circulatory and respiratory systems: Secondary | ICD-10-CM

## 2013-08-12 DIAGNOSIS — R0609 Other forms of dyspnea: Secondary | ICD-10-CM

## 2013-08-12 DIAGNOSIS — I10 Essential (primary) hypertension: Secondary | ICD-10-CM

## 2013-08-12 DIAGNOSIS — E782 Mixed hyperlipidemia: Secondary | ICD-10-CM

## 2013-08-12 DIAGNOSIS — R600 Localized edema: Secondary | ICD-10-CM

## 2013-08-12 DIAGNOSIS — I119 Hypertensive heart disease without heart failure: Secondary | ICD-10-CM

## 2013-08-12 LAB — COMPREHENSIVE METABOLIC PANEL
ALK PHOS: 100 U/L (ref 39–117)
ALT: 22 U/L (ref 0–53)
AST: 22 U/L (ref 0–37)
Albumin: 3.9 g/dL (ref 3.5–5.2)
BILIRUBIN TOTAL: 0.4 mg/dL (ref 0.2–1.2)
BUN: 12 mg/dL (ref 6–23)
CALCIUM: 9 mg/dL (ref 8.4–10.5)
CO2: 27 mEq/L (ref 19–32)
CREATININE: 1.97 mg/dL — AB (ref 0.50–1.35)
Chloride: 105 mEq/L (ref 96–112)
Glucose, Bld: 95 mg/dL (ref 70–99)
Potassium: 4.6 mEq/L (ref 3.5–5.3)
Sodium: 140 mEq/L (ref 135–145)
Total Protein: 6.8 g/dL (ref 6.0–8.3)

## 2013-08-12 LAB — LIPID PANEL
Cholesterol: 248 mg/dL — ABNORMAL HIGH (ref 0–200)
HDL: 41 mg/dL (ref >39)
LDL Cholesterol: 183 mg/dL — ABNORMAL HIGH (ref 0–99)
Total CHOL/HDL Ratio: 6 Ratio
Triglycerides: 121 mg/dL (ref <150)
VLDL: 24 mg/dL (ref 0–40)

## 2013-08-12 LAB — CBC
HEMATOCRIT: 44.1 % (ref 39.0–52.0)
HEMOGLOBIN: 14.9 g/dL (ref 13.0–17.0)
MCH: 24.3 pg — AB (ref 26.0–34.0)
MCHC: 33.8 g/dL (ref 30.0–36.0)
MCV: 72.1 fL — AB (ref 78.0–100.0)
Platelets: 327 10*3/uL (ref 150–400)
RBC: 6.12 MIL/uL — ABNORMAL HIGH (ref 4.22–5.81)
RDW: 19.6 % — ABNORMAL HIGH (ref 11.5–15.5)
WBC: 10.4 10*3/uL (ref 4.0–10.5)

## 2013-08-12 LAB — TSH: TSH: 3.422 u[IU]/mL (ref 0.350–4.500)

## 2013-08-12 MED ORDER — FUROSEMIDE 80 MG PO TABS: ORAL_TABLET | ORAL | Status: DC

## 2013-08-12 MED ORDER — DILTIAZEM HCL ER COATED BEADS 240 MG PO CP24: 240.0000 mg | ORAL_CAPSULE | Freq: Every day | ORAL | Status: DC

## 2013-08-12 NOTE — Patient Instructions (Signed)
Your physician has recommended you make the following change in your medication: increase the furosemide to 80 mg twice a day. STOP the amlodipine.  Start the new prescription given for cardizem 240 mg ( diltiazem). This has already been sent to the pharmacy.  Your physician recommends that you return for lab work TODAY.  Your physician recommends that you schedule a follow-up appointment in: 6 weeks.

## 2013-08-12 NOTE — Progress Notes (Signed)
Patient ID: Ronald FordLouis Balaban Jr., male   DOB: 07-26-1950, 63 y.o.   MRN: [id number]     HPI: [name] Mckee is a [age] year old African American male who is followed primarily at the [facility] in [place]. He presents to the office for followup Cardiolite evaluation of his hypertension as well as peripheral edema.  [name] is a [age] year old male who has served in the [employer] and was in [place] [employer] in [place] [employer].  The patient has a history of seizure disorder for which he takes Keppra, a history of hypertension, diabetes mellitus, renal insufficiency, morbid obesity, hyperlipidemia, obstructive sleep apnea for which he has been utilizing CPAP therapy for 2 years, and a history of gout. He has posttraumatic stress disorder. There also is a history of hepatitis C as well as glaucoma. Laboratory from the [facility] this past summer showed creatinines in the 2-2.2 range. The patient has had difficult control blood pressure. He recently has experienced increasing episodes of shortness of breath with activity. He denies definitive chest pressure. He does admit to being told of having congestive heart failure.  A nuclear perfusion study demonstrated normal perfusion without evidence for a defect on rest and stress images; however, global ejection fraction was reduced at 41%. A renal duplex scan was essentially normal and revealed normal kidney size without evidence for renal artery stenosis. He never did have his followup laboratory nor his echo Doppler study.  Apparently the day before these were to be done he had another seizure activity and consequently did not have those studies performed.  Over the past several months, then titrating his medications in attempt to improve blood pressure control. When I last saw him, I started him on a Darby 18 mg in place of losartan. He has been on increasing doses of diuretic therapy.  He did undergo a 2-D echo Doppler study on 08/05/2013. He did have  mild LVH and now ejection fraction was 60-65% without regional wall motion abnormalities. There was grade 1 diastolic dysfunction. He had mild mitral annular calcification. His left atrium was mildly dilated. He was supposed to get followup laboratory but he never had this done. He presents for evaluation.  In addition, he never did fill the Viagra due to cost and he continued to have difficulty with erectile function.  Past Medical History  Diagnosis Date  . Gout     uric acid-9.7  . Degenerative disc disease   . Coronary atherosclerosis     mild  . Orthostasis   . HTN (hypertension)   . Hyperlipidemia   . Obesity   . S/P lumbar fusion   . History of colonoscopy   . Heart murmur   . Cardiomyopathy   . COPD (chronic obstructive pulmonary disease)   . Shortness of breath   . Recurrent upper respiratory infection (URI)   . Pneumonia   . Sleep apnea     uses cpap  . Diabetes mellitus   . Hepatitis     hep c  . GERD (gastroesophageal reflux disease)   . Seizures   . Headache(784.0)   . TIA (transient ischemic attack)     Past Surgical History  Procedure Laterality Date  . 2d echo  12/22/2004  . Cardiac catheterization  04/02/2005  . Cpap  08/14/2005  . Lumbar disc surgery  07/03/1997    L5-S1; x2  . Mri  10/25/2005    mod to severe biforaminal narrowing L5-S1  . Persantine stress test  01/25/2005  .  Pft  09/15/2005    mild to moderate obstructive airway disease  . Sleep study  08/14/2005    mod OSA/hypopnea    Allergies  Allergen Reactions  . Codeine Itching and Other (See Comments)    Pt has this allergy in here but says he's not allergic to anything    Current Outpatient Prescriptions  Medication Sig Dispense Refill  . ADVAIR DISKUS 100-50 MCG/DOSE AEPB INHALE 1 PUFF INTO THE LUNGS TWICE DAILY  60 each  0  . albuterol (PROAIR HFA) 108 (90 BASE) MCG/ACT inhaler Inhale 2 puffs into the lungs every 6 (six) hours as needed for wheezing.  1 Inhaler  0  . allopurinol  (ZYLOPRIM) 300 MG tablet Take 300 mg by mouth daily.       Marland Kitchen amLODipine (NORVASC) 10 MG tablet Take 10 mg by mouth daily.      Marland Kitchen aspirin 81 MG tablet Take 81 mg by mouth daily.       Marland Kitchen atorvastatin (LIPITOR) 80 MG tablet Take 40 mg by mouth at bedtime.      . Azilsartan Medoxomil 80 MG TABS Take 1 tablet (80 mg total) by mouth daily.  28 tablet  0  . Azilsartan Medoxomil 80 MG TABS Take 1 tablet (80 mg total) by mouth daily.  30 tablet  11  . carvedilol (COREG) 25 MG tablet Take 50 mg by mouth 2 (two) times daily with a meal.       . Cholecalciferol 2000 UNITS TBDP Take 1 capsule by mouth daily.      . diazepam (VALIUM) 10 MG tablet Take 1 tablet by mouth 3 (three) times daily.      . furosemide (LASIX) 80 MG tablet Take 1 tablets in the morning 1/2 tablet at night  45 tablet  6  . gabapentin (NEURONTIN) 600 MG tablet Take 600 mg by mouth 2 (two) times daily.      Marland Kitchen glipiZIDE (GLUCOTROL) 5 MG tablet Take 2.5 mg by mouth 2 (two) times daily before a meal.      . levETIRAcetam (KEPPRA) 750 MG tablet Take 750-1,500 mg by mouth 3 (three) times daily. 1 tablet in the morning; 1 tablet at lunch; 2 tablets in the evening      . minoxidil (LONITEN) 2.5 MG tablet Take 5 mg by mouth 2 (two) times daily.      Marland Kitchen omega-3 acid ethyl esters (LOVAZA) 1 G capsule Take 2 g by mouth 2 (two) times daily.       Marland Kitchen oxyCODONE (ROXICODONE) 15 MG immediate release tablet Take 15 mg by mouth every 8 (eight) hours as needed for pain.       . sildenafil (VIAGRA) 50 MG tablet Take 1 tablet (50 mg total) by mouth daily as needed for erectile dysfunction.  10 tablet  2  . Tamsulosin HCl (FLOMAX) 0.4 MG CAPS Take 0.4 mg by mouth daily.      . traMADol (ULTRAM) 50 MG tablet Take 1 tablet (50 mg total) by mouth every 6 (six) hours as needed for pain.  15 tablet  0  . traZODone (DESYREL) 150 MG tablet Take 150 mg by mouth at bedtime.       No current facility-administered medications for this visit.    Social history is notable  in that he is married for 14 years. He has 5 children. He is disabled. He does not drink alcohol. There is no tobacco use. He does exercise 3 days per week doing stair climbing.  Family History  Problem Relation Age of Onset  . Coronary artery disease Mother   . Hypertension Mother   . Diabetes    . Hypertension Brother     ROS is negative for fever chills or night sweats. He does have a history of morbid obesity. He denies recent rashes. He denies visual changes. He shortness of breath with activity has improved. He is unaware of any recent wheezing, but does have a history of wheezing intermittently and takes takes Proair HFA. He denies cough or sputum production. But he recently had upper respiratory infection/flu.  He denies chest tightness. He denies abdominal pain. He does have GERD. He does take Prilosec over-the-counter. He denies any blood in his stool or urine.  He continues to have adequate sex drive but does does have significant difficulty with erectile function; he never filled the Viagra due to cost. He denies claudication symptoms. He does admit to significant swelling of his legs. He does have renal insufficiency felt to be class III. He does have posttraumatic stress disorder and is on Desyrel 150 mg. He has difficulty sleeping. He has been on CPAP therapy for sleep apnea for 2 years. He does have gout. He does admit to some arthritic discomfort. He does have a history of diabetes mellitus appear he is unaware of thyroid abnormalities. He does have a history of seizure disorder for which he takes Keppra. He has not had any further seizures over the past 2 months.  Other comprehensive 14 point system review is negative.  PE BP 168/134  Pulse 103  Ht 5\' 9"  (1.753 m)  Wt 305 lb 6.4 oz (138.529 kg)  BMI 45.08 kg/m2 Repeat blood pressure by me was still significantly elevated at 158/102  General: Alert, oriented, no distress. Morbidly obese. Skin: normal turgor, no rashes HEENT:  Normocephalic, atraumatic. Pupils round and reactive; sclera anicteric; Fundi arteriolar narrowing. No hemorrhages or exudates. Nose without nasal septal hypertrophy Mouth/Parynx benign; Mallinpatti scale 3/4 Neck: Thick neck; No JVD, no carotid bruits; normal carotid  Lungs: clear to ausculatation and percussion; no wheezing or rales Chest: No tenderness to palpation Heart: RRR, s1 s2 normal 1/6 systolic murmur. soft S4. No S3 gallop. No diastolic murmur. No rub or heave. Abdomen: Morbidly obese with significant central adiposity;soft, nontender; no hepatosplenomehaly, BS+; abdominal aorta nontender and not dilated by palpation. Back: No CVA tenderness Pulses 2+ Extremities: 1+ bilateral lower extremity edema ankles feet and pretibial region slightly improved from his last visit; no clubbinbg cyanosis, Homan's sign negative  Neurologic: grossly nonfocal Psychologic: Appropriate mood and affect. History of posttraumatic stress disorder. History of possible schizophrenia.  ECG (independently read by me): Sinus tachycardia 103 beats per minute. LVH. Nonspecific T-wave changes  Prior ECG of 07/01/2013: NSR at 93 with lateral T wave changes  LABS:  BMET    Component Value Date/Time   NA 140 12/22/2012 1619   K 4.5 12/22/2012 1619   CL 104 12/22/2012 1619   CO2 27 12/22/2012 1619   GLUCOSE 102* 12/22/2012 1619   BUN 18 12/22/2012 1619   CREATININE 1.62* 12/22/2012 1619   CREATININE 1.94* 12/04/2012 1114   CALCIUM 9.6 12/22/2012 1619   GFRNONAA 44* 12/22/2012 1619   GFRAA 51* 12/22/2012 1619     Hepatic Function Panel     Component Value Date/Time   PROT 6.4 12/04/2012 1114   ALBUMIN 3.8 12/04/2012 1114   AST 31 12/04/2012 1114   ALT 27 12/04/2012 1114   ALKPHOS 82 12/04/2012 1114  BILITOT 0.6 12/04/2012 1114   BILIDIR 0.2 07/07/2008 1117   IBILI 0.6 07/07/2008 1117     CBC    Component Value Date/Time   WBC 14.5* 12/22/2012 1619   RBC 5.37 12/22/2012 1619   RBC 4.87 12/02/2011 0705   HGB 13.1  12/22/2012 1619   HCT 38.3* 12/22/2012 1619   PLT 332 12/22/2012 1619   MCV 71.3* 12/22/2012 1619   MCH 24.4* 12/22/2012 1619   MCHC 34.2 12/22/2012 1619   RDW 16.8* 12/22/2012 1619   LYMPHSABS 1.7 12/28/2011 1135   MONOABS 1.2* 12/28/2011 1135   EOSABS 0.2 12/28/2011 1135   BASOSABS 0.0 12/28/2011 1135     BNP    Component Value Date/Time   PROBNP 826.7* 12/22/2012 1619    Lipid Panel     Component Value Date/Time   CHOL 177 12/29/2011 0500   TRIG 135 12/29/2011 0500   HDL 39* 12/29/2011 0500   CHOLHDL 4.5 12/29/2011 0500   VLDL 27 12/29/2011 0500   LDLCALC 111* 12/29/2011 0500     RADIOLOGY: No results found.   ASSESSMENT AND PLAN: Mr. Sindelar is a 63 year old morbidly obese African American male with renal insufficiency, diabetes mellitus, history of seizure disorder who has been on multiple medications for blood pressure consisting of amlodipine 10 mg,  Carvedilol 50 mg twice a day, furosemide 80 mg in the morning, and 40 mg in the afternoon, minoxidil 5 mg twice a day and most recently Edarbi 80 mg daily. He never did have his followup laboratory. He is fasting today and I will check laboratory today consisting of a CMP, CBC, brain natruretic peptide, lipid panel TSH. He has sinus tachycardia at a rate of 103 beats per minute despite taking carvedilol 50 mg twice a day. I will discontinue his amlodipine which is currently at 10 mg and in its place I will start Cardizem CD 240 mg which should provide additional rate control. He still has edema despite taking his Lasix and I will increase his dose to 80 mg twice a day. I will review his laboratory and adjustments will be made if necessary to his medical regimen. We again discussed sodium restriction, as well as weight loss. See him in 6 weeks a cardiology reevaluation. Lennette Bihari, MD, [facility] 08/12/2013 9:02 AM

## 2013-08-13 LAB — BRAIN NATRIURETIC PEPTIDE: Brain Natriuretic Peptide: 185.5 pg/mL — ABNORMAL HIGH (ref 0.0–100.0)

## 2013-08-14 ENCOUNTER — Encounter: Payer: Self-pay | Admitting: *Deleted

## 2013-08-27 ENCOUNTER — Other Ambulatory Visit: Payer: Self-pay | Admitting: *Deleted

## 2013-08-27 DIAGNOSIS — R7989 Other specified abnormal findings of blood chemistry: Secondary | ICD-10-CM

## 2013-08-27 MED ORDER — EZETIMIBE 10 MG PO TABS: 10.0000 mg | ORAL_TABLET | Freq: Every day | ORAL | Status: DC

## 2013-10-09 ENCOUNTER — Ambulatory Visit: Payer: Medicare Other | Admitting: Cardiovascular Disease

## 2013-11-06 ENCOUNTER — Telehealth: Payer: Self-pay | Admitting: Cardiovascular Disease

## 2013-11-07 NOTE — Telephone Encounter (Signed)
Closed encounter °

## 2013-11-18 ENCOUNTER — Ambulatory Visit: Payer: Medicare Other | Admitting: Cardiology

## 2013-11-18 ENCOUNTER — Ambulatory Visit: Payer: Medicare Other | Admitting: Cardiovascular Disease

## 2013-12-01 ENCOUNTER — Encounter: Payer: Self-pay | Admitting: Family Medicine

## 2014-01-14 ENCOUNTER — Encounter: Payer: Self-pay | Admitting: Gastroenterology

## 2014-02-09 ENCOUNTER — Telehealth: Payer: Self-pay | Admitting: Cardiovascular Disease

## 2014-02-09 NOTE — Telephone Encounter (Signed)
Closed encounter °

## 2014-02-12 ENCOUNTER — Ambulatory Visit: Payer: Medicare Other | Admitting: Cardiovascular Disease

## 2014-02-23 ENCOUNTER — Telehealth: Payer: Self-pay | Admitting: Home Health Services

## 2014-02-23 NOTE — Telephone Encounter (Signed)
Per member has diabetic eye exam scheduled for 03/25/14 at [facility]

## 2014-03-25 ENCOUNTER — Ambulatory Visit: Payer: Medicare Other | Admitting: Cardiovascular Disease

## 2014-04-04 ENCOUNTER — Encounter: Payer: Self-pay | Admitting: Gastroenterology

## 2014-04-09 ENCOUNTER — Encounter: Payer: Self-pay | Admitting: Family Medicine

## 2014-04-09 ENCOUNTER — Ambulatory Visit (INDEPENDENT_AMBULATORY_CARE_PROVIDER_SITE_OTHER): Payer: Medicare Other | Admitting: Family Medicine

## 2014-04-09 VITALS — BP 176/108 | HR 105 | Temp 98.8°F | Wt 301.4 lb

## 2014-04-09 DIAGNOSIS — M5441 Lumbago with sciatica, right side: Secondary | ICD-10-CM

## 2014-04-09 MED ORDER — TRAMADOL HCL 50 MG PO TABS
50.0000 mg | ORAL_TABLET | Freq: Three times a day (TID) | ORAL | Status: DC | PRN
Start: 2014-04-09 — End: 2016-07-24

## 2014-04-09 MED ORDER — TIZANIDINE HCL 4 MG PO TABS: 4.0000 mg | ORAL_TABLET | Freq: Three times a day (TID) | ORAL | Status: DC | PRN

## 2014-04-09 NOTE — Progress Notes (Signed)
   Subjective:    Patient ID: Ronald FordLouis Coccia Jr., male    DOB: 25-Dec-1950, 63 y.o.   MRN: [id number]  HPI 63 year old male with chronic low back pain s/p several Lumbar surgeries presents with complaints of low back pain.  1) Low Back Pain  Is chronic but has been worse for the past 2 weeks.  Pain is worse with physical activity/movement.   No recent fall, trauma, injury.  Patient is not currently taking anything for this.   He reports he feels like his is likely secondary to spasm.   No recent fever, chills. No lower extremity pain or weakness. Patient does note that his left big toe is chronically numb/tingly.  Review of Systems Per HPI    Objective:   Physical Exam Filed Vitals:   04/09/14 1021  BP: 176/108  Pulse: 105  Temp: 98.8 F (37.1 C)   Exam: General: Appears uncomfortable. No acute distress. Cardiovascular: RRR. No murmurs, rubs, or gallops. Respiratory: CTAB. No rales, rhonchi, or wheeze. Back: large midline scar noted in the lumbar region. Significant paraspinal muscle spasm noted. Range of motion is significantly diminished secondary to pain. Neuro: 5/5 LE muscle strength; 1+ patellar and achilles reflexes. Sensation grossly intact.    Assessment & Plan:  See Problem List

## 2014-04-09 NOTE — Patient Instructions (Signed)
It was nice to see you today.  I have prescribed you something for pain as well as spasm.  Please follow up with Dr. Jeral FruitBotero if this worsens.  Take care  [facility] Georga Stys DO

## 2014-04-10 NOTE — Assessment & Plan Note (Signed)
Likely related to underlying spasm. We'll treat with Flexeril and tramadol as needed.

## 2014-08-04 ENCOUNTER — Encounter: Payer: Self-pay | Admitting: Family Medicine

## 2014-08-04 ENCOUNTER — Ambulatory Visit (INDEPENDENT_AMBULATORY_CARE_PROVIDER_SITE_OTHER): Payer: Medicare PPO | Admitting: Family Medicine

## 2014-08-04 VITALS — BP 176/101 | HR 108 | Temp 98.2°F | Ht 69.0 in | Wt 288.6 lb

## 2014-08-04 DIAGNOSIS — J209 Acute bronchitis, unspecified: Secondary | ICD-10-CM

## 2014-08-04 MED ORDER — ALBUTEROL 90 MCG/ACT IN AERS: 2.0000 | INHALATION_SPRAY | RESPIRATORY_TRACT | Status: DC | PRN

## 2014-08-04 MED ORDER — BENZONATATE 200 MG PO CAPS: 200.0000 mg | ORAL_CAPSULE | Freq: Three times a day (TID) | ORAL | Status: DC | PRN

## 2014-08-04 NOTE — Patient Instructions (Signed)

## 2014-08-04 NOTE — Progress Notes (Signed)
  Subjective:     Ronald FordLouis Romanoski Jr. is a 64 y.o. male here for evaluation of a cough. Onset of symptoms was 8 days ago. Symptoms have been unchanged since that time. The cough is dry and is aggravated by  cigarette smoking. Associated symptoms include: change in voice. Patient does not have a history of asthma. Patient does not have a history of environmental allergens. Patient has not traveled recently. Patient does not have a history of smoking. Patient has not had a previous chest x-ray. Patient has not had a PPD done.  Denies fever, chills, or sweats.  He has tried OTC ibuprofen, mucinex, robitussin, Delsym, without much relief.   The following portions of the patient's history were reviewed and updated as appropriate: allergies, current medications, past family history, past medical history, past social history, past surgical history and problem list.  Review of Systems Pertinent items are noted in HPI.    Objective:    Oxygen saturation 99% on room air BP 176/101 mmHg  Pulse 108  Temp(Src) 98.2 F (36.8 C) (Oral)  Ht 5\' 9"  (1.753 m)  Wt 288 lb 9.6 oz (130.908 kg)  BMI 42.60 kg/m2 General appearance: alert, cooperative and appears stated age Head: Normocephalic, without obvious abnormality, atraumatic Neck: no adenopathy Lungs: clear to auscultation bilaterally Heart: regular rate and rhythm    Assessment:    Acute Bronchitis    Plan:    Explained lack of efficacy of antibiotics in viral disease. Antitussives per medication orders. Avoid exposure to tobacco smoke and fumes. B-agonist inhaler. Call if shortness of breath worsens, blood in sputum, change in character of cough, development of fever or chills, inability to maintain nutrition and hydration. Avoid exposure to tobacco smoke and fumes. Mucinex 600 mg BID

## 2014-08-11 ENCOUNTER — Emergency Department (HOSPITAL_COMMUNITY)
Admission: EM | Admit: 2014-08-11 | Discharge: 2014-08-11 | Disposition: A | Discharge status: Nursing Home (Includes ICF) | Payer: Medicare PPO | Source: Home / Self Care | Attending: Family Medicine | Admitting: Family Medicine

## 2014-08-11 ENCOUNTER — Emergency Department (INDEPENDENT_AMBULATORY_CARE_PROVIDER_SITE_OTHER): Payer: Medicare PPO

## 2014-08-11 ENCOUNTER — Encounter (HOSPITAL_COMMUNITY): Payer: Self-pay | Admitting: *Deleted

## 2014-08-11 ENCOUNTER — Emergency Department (HOSPITAL_COMMUNITY)
Admission: EM | Admit: 2014-08-11 | Discharge: 2014-08-11 | Discharge status: Against Medical Advice | Payer: Medicare PPO | Attending: Emergency Medicine | Admitting: Emergency Medicine

## 2014-08-11 ENCOUNTER — Encounter (HOSPITAL_COMMUNITY): Payer: Self-pay | Admitting: Emergency Medicine

## 2014-08-11 DIAGNOSIS — E119 Type 2 diabetes mellitus without complications: Secondary | ICD-10-CM | POA: Diagnosis not present

## 2014-08-11 DIAGNOSIS — E669 Obesity, unspecified: Secondary | ICD-10-CM | POA: Insufficient documentation

## 2014-08-11 DIAGNOSIS — I10 Essential (primary) hypertension: Secondary | ICD-10-CM | POA: Insufficient documentation

## 2014-08-11 DIAGNOSIS — I5031 Acute diastolic (congestive) heart failure: Secondary | ICD-10-CM | POA: Diagnosis not present

## 2014-08-11 DIAGNOSIS — I214 Non-ST elevation (NSTEMI) myocardial infarction: Secondary | ICD-10-CM | POA: Diagnosis not present

## 2014-08-11 DIAGNOSIS — R011 Cardiac murmur, unspecified: Secondary | ICD-10-CM | POA: Insufficient documentation

## 2014-08-11 DIAGNOSIS — J449 Chronic obstructive pulmonary disease, unspecified: Secondary | ICD-10-CM | POA: Diagnosis not present

## 2014-08-11 DIAGNOSIS — R079 Chest pain, unspecified: Secondary | ICD-10-CM | POA: Diagnosis present

## 2014-08-11 LAB — BASIC METABOLIC PANEL
Anion gap: 9 (ref 5–15)
BUN: 14 mg/dL (ref 6–23)
CHLORIDE: 107 mmol/L (ref 96–112)
CO2: 24 mmol/L (ref 19–32)
Calcium: 9.4 mg/dL (ref 8.4–10.5)
Creatinine, Ser: 1.79 mg/dL — ABNORMAL HIGH (ref 0.50–1.35)
GFR calc non Af Amer: 39 mL/min — ABNORMAL LOW (ref >90)
GFR, EST AFRICAN AMERICAN: 45 mL/min — AB (ref >90)
GLUCOSE: 98 mg/dL (ref 70–99)
POTASSIUM: 4.4 mmol/L (ref 3.5–5.1)
SODIUM: 140 mmol/L (ref 135–145)

## 2014-08-11 LAB — CBC WITH DIFFERENTIAL/PLATELET
Basophils Absolute: 0.1 10*3/uL (ref 0.0–0.1)
Basophils Relative: 0 % (ref 0–1)
Eosinophils Absolute: 0.5 10*3/uL (ref 0.0–0.7)
Eosinophils Relative: 3 % (ref 0–5)
HCT: 41 % (ref 39.0–52.0)
HEMOGLOBIN: 14.2 g/dL (ref 13.0–17.0)
Lymphocytes Relative: 22 % (ref 12–46)
Lymphs Abs: 3.1 10*3/uL (ref 0.7–4.0)
MCH: 25.3 pg — ABNORMAL LOW (ref 26.0–34.0)
MCHC: 34.6 g/dL (ref 30.0–36.0)
MCV: 73.1 fL — ABNORMAL LOW (ref 78.0–100.0)
MONO ABS: 1 10*3/uL (ref 0.1–1.0)
MONOS PCT: 7 % (ref 3–12)
NEUTROS ABS: 9.4 10*3/uL — AB (ref 1.7–7.7)
Neutrophils Relative %: 68 % (ref 43–77)
Platelets: 278 10*3/uL (ref 150–400)
RBC: 5.61 MIL/uL (ref 4.22–5.81)
RDW: 17.2 % — ABNORMAL HIGH (ref 11.5–15.5)
WBC: 14 10*3/uL — ABNORMAL HIGH (ref 4.0–10.5)

## 2014-08-11 LAB — TROPONIN I: Troponin I: 0.1 ng/mL — ABNORMAL HIGH (ref <0.031)

## 2014-08-11 MED ORDER — LEVOFLOXACIN 750 MG PO TABS
750.0000 mg | ORAL_TABLET | Freq: Once | ORAL | Status: AC
  Administered 2014-08-11: 750 mg via ORAL
  Filled 2014-08-11: qty 1

## 2014-08-11 MED ORDER — HYDROCODONE-ACETAMINOPHEN 5-325 MG PO TABS
1.0000 | ORAL_TABLET | Freq: Once | ORAL | Status: AC
  Administered 2014-08-11: 1 via ORAL
  Filled 2014-08-11: qty 1

## 2014-08-11 MED ORDER — FUROSEMIDE 10 MG/ML IJ SOLN
40.0000 mg | Freq: Once | INTRAMUSCULAR | Status: AC
  Administered 2014-08-11: 40 mg via INTRAVENOUS

## 2014-08-11 MED ORDER — SODIUM CHLORIDE 0.9 % IJ SOLN
INTRAMUSCULAR | Status: AC
  Filled 2014-08-11: qty 3

## 2014-08-11 MED ORDER — HYDROCOD POLST-CHLORPHEN POLST 10-8 MG/5ML PO LQCR: 5.0000 mL | Freq: Two times a day (BID) | ORAL | Status: DC

## 2014-08-11 MED ORDER — PREDNISONE 20 MG PO TABS
60.0000 mg | ORAL_TABLET | Freq: Once | ORAL | Status: AC
  Administered 2014-08-11: 60 mg via ORAL
  Filled 2014-08-11: qty 3

## 2014-08-11 MED ORDER — FUROSEMIDE 10 MG/ML IJ SOLN
INTRAMUSCULAR | Status: AC
  Filled 2014-08-11: qty 2

## 2014-08-11 NOTE — ED Notes (Signed)
Saline  Lok    20  Angio     r  Hand  1    Att

## 2014-08-11 NOTE — ED Notes (Signed)
Pt come [facility] with complaints of Chest pain and SOB.Patient  received a 40 mg a lasix  2 1130 with a BP 160/120. Pt has a 20 L hand. NO edema . No acute distress. Vitals  164/123 Hr 110 97% 2L. Pt denies talking BP meds today. Patient has no received any ASA .

## 2014-08-11 NOTE — ED Provider Notes (Addendum)
CSN: [id number]     Arrival date & time 08/11/14  0904 History   First MD Initiated Contact with Patient 08/11/14 [phone]     Chief Complaint  Patient presents with  . Shortness of Breath   (Consider location/radiation/quality/duration/timing/severity/associated sxs/prior Treatment) Patient is a 64 y.o. male presenting with shortness of breath. The history is provided by the patient.  Shortness of Breath Severity:  Moderate Onset quality:  Gradual Duration:  1 week Progression:  Worsening Chronicity:  Recurrent Context comment:  Seen by lmd last week for same sx but couldn't afford meds. Relieved by:  None tried Worsened by:  Nothing tried Ineffective treatments:  None tried Associated symptoms: chest pain and cough   Associated symptoms: no fever     Past Medical History  Diagnosis Date  . Gout     uric acid-9.7  . Degenerative disc disease   . Coronary atherosclerosis     mild  . Orthostasis   . HTN (hypertension)   . Hyperlipidemia   . Obesity   . S/P lumbar fusion   . History of colonoscopy   . Heart murmur   . Cardiomyopathy   . COPD (chronic obstructive pulmonary disease)   . Shortness of breath   . Recurrent upper respiratory infection (URI)   . Pneumonia   . Sleep apnea     uses cpap  . Diabetes mellitus   . Hepatitis     hep c  . GERD (gastroesophageal reflux disease)   . Seizures   . Headache(784.0)   . TIA (transient ischemic attack)    Past Surgical History  Procedure Laterality Date  . 2d echo  12/22/2004  . Cardiac catheterization  04/02/2005  . Cpap  08/14/2005  . Lumbar disc surgery  07/03/1997    L5-S1; x2  . Mri  10/25/2005    mod to severe biforaminal narrowing L5-S1  . Persantine stress test  01/25/2005  . Pft  09/15/2005    mild to moderate obstructive airway disease  . Sleep study  08/14/2005    mod OSA/hypopnea   Family History  Problem Relation Age of Onset  . Coronary artery disease Mother   . Hypertension Mother   . Diabetes    .  Hypertension Brother    History  Substance Use Topics  . Smoking status: Never Smoker   . Smokeless tobacco: Never Used  . Alcohol Use: No    Review of Systems  Constitutional: Negative.  Negative for fever.  HENT: Negative.   Respiratory: Positive for cough and shortness of breath.   Cardiovascular: Positive for chest pain and leg swelling. Negative for palpitations.    Allergies  Codeine  Home Medications   Prior to Admission medications   Medication Sig Start Date End Date Taking? Authorizing Provider  ADVAIR DISKUS 100-50 MCG/DOSE AEPB INHALE 1 PUFF INTO THE LUNGS TWICE DAILY 04/28/13   Hilarie Fredrickson, MD  albuterol (PROAIR HFA) 108 (90 BASE) MCG/ACT inhaler Inhale 2 puffs into the lungs every 6 (six) hours as needed for wheezing. 12/16/12   Amber Nydia Bouton, MD  albuterol (PROVENTIL,VENTOLIN) 90 MCG/ACT inhaler Inhale 2 puffs into the lungs every 4 (four) hours as needed. 08/04/14 07/30/15  Twana First Hess, DO  allopurinol (ZYLOPRIM) 300 MG tablet Take 300 mg by mouth daily.     Historical Provider, MD  aspirin 81 MG tablet Take 81 mg by mouth daily.     Historical Provider, MD  atorvastatin (LIPITOR) 80 MG tablet Take 40 mg by  mouth at bedtime.    Historical Provider, MD  Azilsartan Medoxomil 80 MG TABS Take 1 tablet (80 mg total) by mouth daily. 07/01/13   Lennette Bihari, MD  benzonatate (TESSALON) 200 MG capsule Take 1 capsule (200 mg total) by mouth 3 (three) times daily as needed for cough. 08/04/14   Twana First Hess, DO  carvedilol (COREG) 25 MG tablet Take 50 mg by mouth 2 (two) times daily with a meal.     Historical Provider, MD  Cholecalciferol 2000 UNITS TBDP Take 1 capsule by mouth daily.    Historical Provider, MD  diazepam (VALIUM) 10 MG tablet Take 1 tablet by mouth 3 (three) times daily. 08/05/13   Historical Provider, MD  diltiazem (CARDIZEM CD) 240 MG 24 hr capsule Take 1 capsule (240 mg total) by mouth daily. 08/12/13   Lennette Bihari, MD  ezetimibe (ZETIA) 10 MG tablet  Take 1 tablet (10 mg total) by mouth daily. 08/27/13   Lennette Bihari, MD  furosemide (LASIX) 80 MG tablet 1 tablet twice daily 08/12/13   Lennette Bihari, MD  gabapentin (NEURONTIN) 600 MG tablet Take 600 mg by mouth 2 (two) times daily.    Historical Provider, MD  glipiZIDE (GLUCOTROL) 5 MG tablet Take 2.5 mg by mouth 2 (two) times daily before a meal.    Historical Provider, MD  levETIRAcetam (KEPPRA) 750 MG tablet Take 750-1,500 mg by mouth 3 (three) times daily. 1 tablet in the morning; 1 tablet at lunch; 2 tablets in the evening    Historical Provider, MD  minoxidil (LONITEN) 2.5 MG tablet Take 5 mg by mouth 2 (two) times daily.    Historical Provider, MD  omega-3 acid ethyl esters (LOVAZA) 1 G capsule Take 2 g by mouth 2 (two) times daily.     Historical Provider, MD  oxyCODONE (ROXICODONE) 15 MG immediate release tablet Take 15 mg by mouth every 8 (eight) hours as needed for pain.  11/20/12   Historical Provider, MD  sildenafil (VIAGRA) 50 MG tablet Take 1 tablet (50 mg total) by mouth daily as needed for erectile dysfunction. 06/03/13   Lennette Bihari, MD  Tamsulosin HCl (FLOMAX) 0.4 MG CAPS Take 0.4 mg by mouth daily.    Historical Provider, MD  tiZANidine (ZANAFLEX) 4 MG tablet Take 1 tablet (4 mg total) by mouth every 8 (eight) hours as needed for muscle spasms. 04/09/14   Tommie Sams, DO  traMADol (ULTRAM) 50 MG tablet Take 1 tablet (50 mg total) by mouth every 6 (six) hours as needed for pain. 12/22/12   Vanetta Mulders, MD  traMADol (ULTRAM) 50 MG tablet Take 1 tablet (50 mg total) by mouth every 8 (eight) hours as needed. 04/09/14   Tommie Sams, DO  traZODone (DESYREL) 150 MG tablet Take 150 mg by mouth at bedtime.    Historical Provider, MD   BP 176/130 mmHg  Pulse 132  Temp(Src) 98.6 F (37 C) (Oral)  Resp 30  SpO2 98% Physical Exam  Constitutional: He is oriented to person, place, and time. He appears well-developed and well-nourished. He appears distressed.  HENT:  Head:  Normocephalic.  Mouth/Throat: Oropharynx is clear and moist.  Eyes: Pupils are equal, round, and reactive to light.  Neck: Normal range of motion. Neck supple.  Cardiovascular: Regular rhythm and intact distal pulses.  Tachycardia present.   Pulmonary/Chest: He has decreased breath sounds. He has rales.  Lymphadenopathy:    He has no cervical adenopathy.  Neurological: He is  alert and oriented to person, place, and time.  Skin: Skin is warm and dry.  Nursing note and vitals reviewed.   ED Course  Procedures (including critical care time) Labs Review Labs Reviewed - No data to display ecg- tach, st-t changes unchanged from 2015. Imaging Review Dg Chest 2 View  08/11/2014   CLINICAL DATA:  64 year old male with shortness of breath and chest pain for 1 week. Initial encounter.  EXAM: CHEST  2 VIEW  COMPARISON:  12/22/2012 and earlier.  FINDINGS: Stable lung volumes. Stable cardiac size and mediastinal contours. Visualized tracheal air column is within normal limits. No pneumothorax, pulmonary edema, pleural effusion or acute pulmonary opacity identified. No acute osseous abnormality identified.  IMPRESSION: No acute cardiopulmonary abnormality.   Electronically Signed   By: Odessa FlemingH  Hall M.D.   On: 08/11/2014 10:28   X-rays reviewed and report per radiologist.   MDM   1. CHF (congestive heart failure), NYHA class I, acute, diastolic    Sent for eval and care of right heart failure sx, volume overload. Pt of [facility] medicine service.    Linna HoffJames D Reshunda Strider, MD 08/11/14 1051  Linna HoffJames D Zackory Pudlo, MD 08/11/14 [phone]

## 2014-08-11 NOTE — Discharge Instructions (Signed)
Your lab tests today show that you have had a heart attack. You should stay in the hospital for further care and treatment. If you change your mind at any time, then please return. I refusing care you're substantial risk for sudden death, worsening cardiac disease, or permanent disability because of untreated heart disease. Dr. Romilda Joy explained this all to you at length in your room in the emergency room before you were discharged.  Myocardial Infarction A myocardial infarction (MI) is damage to the heart that is not reversible. It is also called a heart attack. An MI usually occurs when a heart (coronary) artery becomes blocked or narrowed. This cuts off the blood supply to the heart. When one or more of the heart (coronary) arteries becomes blocked, that area of the heart begins to die. This causes pain felt during an MI.  If you think you might be having an MI, call your local emergency services immediately (911 in [place] It is recommended that you chew and swallow 3 non-enteric coated baby aspirin if you do not have an aspirin allergy. Do not drive yourself to the hospital or wait to see if your symptoms go away. The sooner MI is treated, the greater the amount of heart muscle saved. Time is muscle. It can save your life. CAUSES  An MI can occur from:  A gradual buildup of a fatty substance called plaque. When plaque builds up in the arteries, this condition is called atherosclerosis. This buildup can block or reduce the blood supply to the heart artery(s).  A sudden plaque rupture within a heart artery that causes a blood clot (thrombus). A blood clot can block the heart artery which does not allow blood flow to the heart.  A severe tightening (spasm) of the heart artery. This is a less common cause of a heart attack. When a heart artery spasms, it cuts off blood flow through the artery. Spasms can occur in heart arteries that do not have atherosclerosis. RISK FACTORS People at risk for an MI  usually have one or more risk factors, such as:  High blood pressure.  High cholesterol.  Smoking.  Gender. Men have a higher heart attack risk.  Overweight/obesity.  Age.  Family history.  Lack of exercise.  Diabetes.  Stress.  Excessive alcohol use.  Street drug use (cocaine and methamphetamines). SYMPTOMS  MI symptoms can vary, such as:  In both men and women, MI symptoms can include the following:  Chest pain. The chest pain may feel like a crushing, squeezing, or "pressure" type feeling. MI pain can be "referred," meaning pain can be caused in one part of the body but felt in another part of the body. Referred MI pain may occur in the left arm, neck, or jaw. Pain may even be felt in the right arm.  Shortness of breath (dyspnea).  Heartburn or indigestion with or without vomiting, shortness of breath, or sweating (diaphoresis).  Sudden, cold sweats.  Sudden lightheadedness.  Upper back pain.  Women can have unique MI symptoms, such as:  Unexplained feelings of nervousness or anxiety.  Discomfort between the shoulder blades (scapula) or upper back.  Tingling in the hands and arms.  In elderly people (regardless of gender), MI symptoms can be subtle, such as:  Sweating (diaphoresis).  Shortness of breath (dyspnea).  General tiredness (fatigue) or not feeling well (malaise). DIAGNOSIS  Diagnosis of an MI involves several tests such as:  An assessment of your vital signs such as heart rhythm, blood pressure,  respiratory rate, and oxygen level.  An EKG (ECG) to look at the electrical activity of your heart.  Blood tests called cardiac markers are drawn at scheduled times to measure proteins or enzymes released by the damaged heart muscle.  A chest X-ray.  An echocardiogram to evaluate heart motion and blood flow.  Coronary angiography (cardiac catheterization). This is a diagnostic procedure to look at the heart arteries. TREATMENT  Acute  Intervention. For an MI, the national standard in the [place] is to have an acute intervention in under 90 minutes from the time you get to the hospital. An acute intervention is a special procedure to open up the heart arteries. It is done in a treatment room called a "catheterization lab" (cath lab). Some hospitals do no have a cath lab. If you are having an MI and the hospital does not have a cath lab, the standard is to transport you to a hospital that has one. In the cath lab, acute intervention includes:  Angioplasty. An angioplasty involves inserting a thin, flexible tube (catheter) into an artery in either your groin or wrist. The catheter is threaded to the heart arteries. A balloon at the end of the catheter is inflated to open a narrowed or blocked heart artery. During an angioplasty procedure, a small mesh tube (stent) may be used to keep the heart artery open. Depending on your condition and health history, one of two types of stents may be placed:  Drug-eluting stent (DES). A DES is coated with a medicine to prevent scar tissue from growing over the stent. With drug-eluting stents, blood thinning medicine will need to be taken for up to a year.  Bare metal stent. This type of stent has no special coating to keep tissue from growing over it. This type of stent is used if you cannot take blood thinning medicine for a prolonged time or you need surgery in the near future. After a bare metal stent is placed, blood thinning medicine will need to be taken for about a month.  If you are taking blood thinning medicine (anti-platelet therapy) after stent placement, do not stop taking it unless your caregiver says it is okay to do so. Make sure you understand how long you need to take the medicine. Surgical Intervention  If an acute intervention is not successful, surgery may be needed:  Open heart surgery (coronary artery bypass graft, CABG). CABG takes a vein (saphenous vein) from your leg.  The vein is then attached to the blocked heart artery which bypasses the blockage. This then allows blood flow to the heart muscle. Additional Interventions  A "clot buster" medicine (thrombolytic) may be given. This medicine can help break up a clot in the heart artery. This medicine may be given if a person cannot get to a cath lab right away.  Intra-aortic balloon pump (IABP). If you have suffered a very severe MI and are too unstable to go to the cath lab or to surgery, an IABP may be used. This is a temporary mechanical device used to increase blood flow to the heart and reduce the workload of the heart until you are stable enough to go to the cath lab or surgery. HOME CARE INSTRUCTIONS After an MI, you may need the following:  Medicine. Take medicine as directed by your caregiver. Medicines after an MI may:  Keep your blood from clotting easily (blood thinners).  Control your blood pressure.  Help lower your cholesterol.  Control abnormal heart rhythms.  Lifestyle changes. Under the guidance of your caregiver, lifestyle changes include:  Quitting smoking, if you smoke. Your caregiver can help you quit.  Being physically active.  Maintaining a healthy weight.  Eating a heart healthy diet. A dietitian can help you learn healthy eating options.  Managing diabetes.  Reducing stress.  Limiting alcohol intake. SEEK IMMEDIATE MEDICAL CARE IF:   You have severe chest pain, especially if the pain is crushing or pressure-like and spreads to the arms, back, neck, or jaw. This is an emergency. Do not wait to see if the pain will go away. Get medical help at once. Call your local emergency services (911 in the [place] Do not drive yourself to the hospital.  You have shortness of breath during rest, sleep, or with activity.  You have sudden sweating or clammy skin.  You feel sick to your stomach (nauseous) and throw up (vomit).  You suddenly become lightheaded or dizzy.  You feel  your heart beating rapidly or you notice "skipped" beats. MAKE SURE YOU:   Understand these instructions.  Will watch your condition.  Will get help right away if you are not doing well or get worse. Document Released: 06/19/2005 Document Revised: 06/24/2013 Document Reviewed: 08/22/2013 [facility] Patient Information 2015 [place] [place]. This information is not intended to replace advice given to you by your health care provider. Make sure you discuss any questions you have with your health care provider.

## 2014-08-11 NOTE — ED Notes (Addendum)
Pt  Reports   Shortness     Of  Breath       X  1  Week        -      Did  Not  Take  meds  This  Am           He   Is  Speaking in  Short  sentances   Pt  Also reports   Tightness  In  Chest  And  Pressure  In  Chest

## 2014-08-11 NOTE — ED Notes (Signed)
Nurse and Dr. Fayrene FearingJames witnessed AMA form signed by patient.

## 2014-09-16 ENCOUNTER — Other Ambulatory Visit: Payer: Self-pay | Admitting: Cardiovascular Disease

## 2014-10-23 ENCOUNTER — Ambulatory Visit: Payer: Medicare HMO | Admitting: Cardiovascular Disease

## 2014-11-26 ENCOUNTER — Other Ambulatory Visit: Payer: Self-pay | Admitting: Cardiovascular Disease

## 2014-11-26 NOTE — Telephone Encounter (Signed)
Rx(s) sent to pharmacy electronically.  

## 2014-12-11 ENCOUNTER — Encounter: Payer: Self-pay | Admitting: Obstetrics and Gynecology

## 2014-12-11 ENCOUNTER — Ambulatory Visit (INDEPENDENT_AMBULATORY_CARE_PROVIDER_SITE_OTHER): Payer: Medicare HMO | Admitting: Obstetrics and Gynecology

## 2014-12-11 VITALS — BP 163/88 | HR 110 | Temp 97.9°F | Ht 69.0 in | Wt 281.7 lb

## 2014-12-11 DIAGNOSIS — M5432 Sciatica, left side: Secondary | ICD-10-CM | POA: Diagnosis not present

## 2014-12-11 DIAGNOSIS — I10 Essential (primary) hypertension: Secondary | ICD-10-CM

## 2014-12-11 DIAGNOSIS — N183 Chronic kidney disease, stage 3 unspecified: Secondary | ICD-10-CM

## 2014-12-11 DIAGNOSIS — E1322 Other specified diabetes mellitus with diabetic chronic kidney disease: Secondary | ICD-10-CM | POA: Diagnosis not present

## 2014-12-11 DIAGNOSIS — E1122 Type 2 diabetes mellitus with diabetic chronic kidney disease: Secondary | ICD-10-CM

## 2014-12-11 MED ORDER — CYCLOBENZAPRINE HCL 5 MG PO TABS: 5.0000 mg | ORAL_TABLET | Freq: Three times a day (TID) | ORAL | Status: DC | PRN

## 2014-12-11 NOTE — Assessment & Plan Note (Signed)
Unable to assess diabetes as patient is seen at another office for care. Discussed with patient need to get care all at one place.

## 2014-12-11 NOTE — Patient Instructions (Addendum)
[name] it was great to meet you today!  I am sorry to hear that you are having back pain  Here are some of the things we discussed today: -Flexeril as a muscle relaxant to relax muscles -use heating pad  Please schedule a follow-up appointment for a complete physical.   Thanks for allowing me to be a part of your care! Dr. Doroteo Glassman    Chronic Back Pain  When back pain lasts longer than 3 months, it is called chronic back pain.People with chronic back pain often go through certain periods that are more intense (flare-ups).  CAUSES Chronic back pain can be caused by wear and tear (degeneration) on different structures in your back. These structures include:  The bones of your spine (vertebrae) and the joints surrounding your spinal cord and nerve roots (facets).  The strong, fibrous tissues that connect your vertebrae (ligaments). Degeneration of these structures may result in pressure on your nerves. This can lead to constant pain. HOME CARE INSTRUCTIONS  Avoid bending, heavy lifting, prolonged sitting, and activities which make the problem worse.  Take brief periods of rest throughout the day to reduce your pain. Lying down or standing usually is better than sitting while you are resting.  Take over-the-counter or prescription medicines only as directed by your caregiver. SEEK IMMEDIATE MEDICAL CARE IF:   You have weakness or numbness in one of your legs or feet.  You have trouble controlling your bladder or bowels.  You have nausea, vomiting, abdominal pain, shortness of breath, or fainting. Document Released: 07/27/2004 Document Revised: 09/11/2011 Document Reviewed: 06/03/2011 [facility] Patient Information 2015 [place], [place]. This information is not intended to replace advice given to you by your health care provider. Make sure you discuss any questions you have with your health care provider.

## 2014-12-11 NOTE — Progress Notes (Signed)
   Subjective: Chief Complaint  Patient presents with  . Medication Concern    HPI: [name]. is a 64 y.o. presenting to clinic today to discuss the following:  Patient initially presented telling nurse that he needed a letter saying that his kidney function was stable. Patient states that he has chronic kidney disease and receives oxycodone by his "back" doctor. His back doctor wanted him to get a letter from his kidney doctor stating that his kidneys were stable. Patient states that his kidney doctor at the [place] will provide this letter.  He came in today because of his back pain. He rates his back pain 7 out of 10. States he has had 7 back surgeries. Says that his pain is throbbing and that he has radiation down to the back of his heel. Known history of low back pain and sciatica. Patient states he has taken two oxycodones today and diazepam. Wants to know what else can be done for his back pain  Hypertension: Patient with uncontrolled blood pressures. Patient states that they fluctuate. Hypertension being followed by [facility] PCP. He is unsure what medications he is currently on but states that he is taking all medications.  Diabetes: States that he no longer has [facility]. He does not take any medications for diabetes and states that he is diet controlled.  Of note patient goes to [place] for most of his care states that he uses this clinic for appointments for when he is sick or need to be seen quicker. He has a PCP and other specialty doctors at the [place].   ROS: Denies headache, dizziness, visual changes, nausea, vomiting, chest pain, abdominal pain or shortness of breath. Also per HPI.   Objective: BP 163/88 mmHg  Pulse 110  Temp(Src) 97.9 F (36.6 C) (Oral)  Ht 5\' 9"  (1.753 m)  Wt 281 lb 11.2 oz (127.778 kg)  BMI 41.58 kg/m2  Physical Exam  Constitutional: He is oriented to person, place, and time and well-developed, well-nourished, and in no distress.  Obese  HENT:    Head: Normocephalic and atraumatic.  Eyes: EOM are normal.  Cardiovascular: Normal rate, regular rhythm, normal heart sounds and intact distal pulses.   Pulmonary/Chest: Effort normal and breath sounds normal.  Abdominal: Soft. Bowel sounds are normal.  Musculoskeletal: Normal range of motion. He exhibits tenderness.  TTP on left lower back  Neurological: He is alert and oriented to person, place, and time. He has normal sensation and normal strength.  No focal deficits appreciated  Skin: Skin is warm and dry.  Psychiatric: Mood and affect normal.    Assessment/Plan: Please see problem based Assessment and Plan  Health Maintainance: Rece[facility]ative care at [facility]. Discussed with patient need to get care all at one place.   Meds ordered this encounter  Medications  . cyclobenzaprine (FLEXERIL) 5 MG tablet    Sig: Take 1 tablet (5 mg total) by mouth 3 (three) times daily as needed for muscle spasms.    Dispense:  30 tablet    Refill:  1    Caryl Ada, DO 12/11/2014, 2:49 PM PGY-1, [facility]

## 2014-12-11 NOTE — Assessment & Plan Note (Signed)
A: Chronic condition.  P: Patient to follow-up with specialist who prescribes his pain medications. Discussed with patient I will not be prescribing him any narcotic pain medications as he is artery giving this from another provider. Encouraged patient to continue taking his gabapentin and oxycodone as prescribed. Patient given Rx for Flexeril prn. Encouraged patient also to use heating pad. Handout given.

## 2014-12-11 NOTE — Assessment & Plan Note (Signed)
Encourage patient to continue current regimen that [facility]. Told him to let PCP know that he had elevated pressures today. Will not adjust any medications at this time as not to undo what is already being done by another doctor.

## 2014-12-30 ENCOUNTER — Encounter: Payer: Self-pay | Admitting: Physical Medicine & Rehabilitation

## 2015-01-12 ENCOUNTER — Ambulatory Visit: Payer: Medicare HMO | Admitting: Obstetrics and Gynecology

## 2015-01-21 ENCOUNTER — Encounter: Payer: Self-pay | Admitting: Physical Medicine & Rehabilitation

## 2015-02-12 ENCOUNTER — Ambulatory Visit: Payer: Medicare HMO | Admitting: Physical Medicine & Rehabilitation

## 2015-02-12 ENCOUNTER — Ambulatory Visit: Payer: Medicare HMO

## 2015-02-22 ENCOUNTER — Ambulatory Visit: Payer: Medicare HMO | Admitting: Physical Medicine & Rehabilitation

## 2015-02-22 ENCOUNTER — Ambulatory Visit: Payer: Medicare HMO

## 2015-03-11 ENCOUNTER — Ambulatory Visit: Payer: Medicare HMO | Admitting: Physical Medicine & Rehabilitation

## 2015-03-12 ENCOUNTER — Ambulatory Visit: Payer: Medicare HMO | Admitting: Physical Medicine & Rehabilitation

## 2015-03-12 ENCOUNTER — Ambulatory Visit: Payer: Medicare HMO

## 2015-04-05 ENCOUNTER — Ambulatory Visit: Payer: Medicare HMO | Admitting: Physical Medicine & Rehabilitation

## 2015-04-05 ENCOUNTER — Encounter: Payer: Medicare HMO | Attending: Physical Medicine & Rehabilitation

## 2015-04-20 ENCOUNTER — Other Ambulatory Visit: Payer: Self-pay | Admitting: Cardiovascular Disease

## 2015-04-20 NOTE — Telephone Encounter (Signed)
Rx(s) sent to pharmacy electronically.  

## 2015-06-18 ENCOUNTER — Ambulatory Visit (INDEPENDENT_AMBULATORY_CARE_PROVIDER_SITE_OTHER): Payer: Medicare HMO | Admitting: *Deleted

## 2015-06-18 DIAGNOSIS — Z23 Encounter for immunization: Secondary | ICD-10-CM | POA: Diagnosis not present

## 2015-12-02 ENCOUNTER — Emergency Department (HOSPITAL_COMMUNITY)
Admission: EM | Admit: 2015-12-02 | Discharge: 2015-12-03 | Disposition: A | Discharge status: Home / Self Care | Payer: Medicare Other | Attending: Emergency Medicine | Admitting: Emergency Medicine

## 2015-12-02 ENCOUNTER — Emergency Department (HOSPITAL_COMMUNITY): Payer: Medicare Other

## 2015-12-02 ENCOUNTER — Encounter (HOSPITAL_COMMUNITY): Payer: Self-pay | Admitting: Emergency Medicine

## 2015-12-02 DIAGNOSIS — Z7984 Long term (current) use of oral hypoglycemic drugs: Secondary | ICD-10-CM | POA: Diagnosis not present

## 2015-12-02 DIAGNOSIS — Z7982 Long term (current) use of aspirin: Secondary | ICD-10-CM | POA: Insufficient documentation

## 2015-12-02 DIAGNOSIS — I10 Essential (primary) hypertension: Secondary | ICD-10-CM | POA: Diagnosis not present

## 2015-12-02 DIAGNOSIS — Y999 Unspecified external cause status: Secondary | ICD-10-CM | POA: Insufficient documentation

## 2015-12-02 DIAGNOSIS — W010XXA Fall on same level from slipping, tripping and stumbling without subsequent striking against object, initial encounter: Secondary | ICD-10-CM | POA: Diagnosis not present

## 2015-12-02 DIAGNOSIS — M25561 Pain in right knee: Secondary | ICD-10-CM | POA: Insufficient documentation

## 2015-12-02 DIAGNOSIS — Z8673 Personal history of transient ischemic attack (TIA), and cerebral infarction without residual deficits: Secondary | ICD-10-CM | POA: Diagnosis not present

## 2015-12-02 DIAGNOSIS — E785 Hyperlipidemia, unspecified: Secondary | ICD-10-CM | POA: Insufficient documentation

## 2015-12-02 DIAGNOSIS — Y92009 Unspecified place in unspecified non-institutional (private) residence as the place of occurrence of the external cause: Secondary | ICD-10-CM | POA: Diagnosis not present

## 2015-12-02 DIAGNOSIS — S99921A Unspecified injury of right foot, initial encounter: Secondary | ICD-10-CM | POA: Diagnosis not present

## 2015-12-02 DIAGNOSIS — Y939 Activity, unspecified: Secondary | ICD-10-CM | POA: Diagnosis not present

## 2015-12-02 DIAGNOSIS — E669 Obesity, unspecified: Secondary | ICD-10-CM | POA: Diagnosis not present

## 2015-12-02 DIAGNOSIS — I2510 Atherosclerotic heart disease of native coronary artery without angina pectoris: Secondary | ICD-10-CM | POA: Diagnosis not present

## 2015-12-02 DIAGNOSIS — E119 Type 2 diabetes mellitus without complications: Secondary | ICD-10-CM | POA: Diagnosis not present

## 2015-12-02 DIAGNOSIS — Z79899 Other long term (current) drug therapy: Secondary | ICD-10-CM | POA: Diagnosis not present

## 2015-12-02 DIAGNOSIS — J449 Chronic obstructive pulmonary disease, unspecified: Secondary | ICD-10-CM | POA: Insufficient documentation

## 2015-12-02 DIAGNOSIS — M79661 Pain in right lower leg: Secondary | ICD-10-CM | POA: Diagnosis not present

## 2015-12-02 DIAGNOSIS — M25571 Pain in right ankle and joints of right foot: Secondary | ICD-10-CM | POA: Diagnosis not present

## 2015-12-02 MED ORDER — OXYCODONE-ACETAMINOPHEN 5-325 MG PO TABS
1.0000 | ORAL_TABLET | Freq: Once | ORAL | Status: AC
  Administered 2015-12-02: 1 via ORAL
  Filled 2015-12-02: qty 1

## 2015-12-02 NOTE — ED Provider Notes (Signed)
CSN: [id number]     Arrival date & time 12/02/15  2225 History  By signing my name below, I, Hollace HaywardAndrew Hiatt, attest that this documentation has been prepared under the direction and in the presence of Terance HartKelly Gekas, PA-C.   Electronically Signed: Hollace HaywardAndrew Hiatt, ED Scribe. 12/02/2015. 11:15 PM.   Chief Complaint  Patient presents with  . Knee Pain  . Fall   The history is provided by the patient. No language interpreter was used.   HPI Comments: Ronald FordLouis Plante Jr. brought in by ambulance is a 65 y.o. male with a PMHx of Gout, Degenerative disk disease, Coronary atherosclerosis, Orthostasis, HTN, HLD, Obesity, S/P Lumbar fusion, COPD, and GERD who presents to the Emergency Department complaining of gradually worsening, constant right knee pain s/p ground level fall that occured two days ago. Pt has associated ecchymosis. Pt states that the pain radiates into his thigh, calf, ankle and foot. Pt reports that he twisted his leg and fell onto his kneecap after slipping on a slick spot in his house. Pt also reports that he has felt off balance and has fallen four more times onto the same leg since his accident. Pt denies LOC or head injury. His pain is worsened upon movement and when he puts pressure on his right leg. He has been limping, and can only ambulate with the assistance of a cane which he normally ambulates independently. Pt took Ibuprofen 600mg  two times a day every day since the accident. His last dose of Ibuprofen was earlier this afternoon PTA (approximately 7 hours ago). Pt has a PSHx of L5-S1 lumbar disk surgery. Pt denies hip pain.   Past Medical History  Diagnosis Date  . Gout     uric acid-9.7  . Degenerative disc disease   . Coronary atherosclerosis     mild  . Orthostasis   . HTN (hypertension)   . Hyperlipidemia   . Obesity   . S/P lumbar fusion   . History of colonoscopy   . Heart murmur   . Cardiomyopathy (HCC)   . COPD (chronic obstructive pulmonary disease) (HCC)   . Shortness  of breath   . Recurrent upper respiratory infection (URI)   . Pneumonia   . Sleep apnea     uses cpap  . Diabetes mellitus   . Hepatitis     hep c  . GERD (gastroesophageal reflux disease)   . Seizures (HCC)   . Headache(784.0)   . TIA (transient ischemic attack)    Past Surgical History  Procedure Laterality Date  . 2d echo  12/22/2004  . Cardiac catheterization  04/02/2005  . Cpap  08/14/2005  . Lumbar disc surgery  07/03/1997    L5-S1; x2  . Mri  10/25/2005    mod to severe biforaminal narrowing L5-S1  . Persantine stress test  01/25/2005  . Pft  09/15/2005    mild to moderate obstructive airway disease  . Sleep study  08/14/2005    mod OSA/hypopnea   Family History  Problem Relation Age of Onset  . Coronary artery disease Mother   . Hypertension Mother   . Diabetes    . Hypertension Brother    Social History  Substance Use Topics  . Smoking status: Never Smoker   . Smokeless tobacco: Never Used  . Alcohol Use: No    Review of Systems  Musculoskeletal: Positive for myalgias and arthralgias.  Skin:       +ecchymosis  Neurological: Negative for syncope.    Allergies  Codeine  Home Medications   Prior to Admission medications   Medication Sig Start Date End Date Taking? Authorizing Provider  ADVAIR DISKUS 100-50 MCG/DOSE AEPB INHALE 1 PUFF INTO THE LUNGS TWICE DAILY 04/28/13   Hilarie Fredrickson, MD  albuterol (PROAIR HFA) 108 (90 BASE) MCG/ACT inhaler Inhale 2 puffs into the lungs every 6 (six) hours as needed for wheezing. 12/16/12   Amber Nydia Bouton, MD  albuterol (PROVENTIL,VENTOLIN) 90 MCG/ACT inhaler Inhale 2 puffs into the lungs every 4 (four) hours as needed. 08/04/14 07/30/15  Twana First Hess, DO  allopurinol (ZYLOPRIM) 300 MG tablet Take 300 mg by mouth daily.     Historical Provider, MD  aspirin 81 MG tablet Take 81 mg by mouth daily.     Historical Provider, MD  atorvastatin (LIPITOR) 80 MG tablet Take 40 mg by mouth at bedtime.    Historical Provider, MD   Azilsartan Medoxomil (EDARBI) 80 MG TABS Take 1 tablet (80 mg total) by mouth daily. 11/26/14   Lennette Bihari, MD  benzonatate (TESSALON) 200 MG capsule Take 1 capsule (200 mg total) by mouth 3 (three) times daily as needed for cough. 08/04/14   Twana First Hess, DO  carvedilol (COREG) 25 MG tablet Take 50 mg by mouth 2 (two) times daily with a meal.     Historical Provider, MD  chlorpheniramine-HYDROcodone (TUSSIONEX PENNKINETIC ER) 10-8 MG/5ML LQCR Take 5 mLs by mouth every 12 (twelve) hours. 08/11/14   Rolland Porter, MD  Cholecalciferol 2000 UNITS TBDP Take 1 capsule by mouth daily.    Historical Provider, MD  cyclobenzaprine (FLEXERIL) 5 MG tablet Take 1 tablet (5 mg total) by mouth 3 (three) times daily as needed for muscle spasms. 12/11/14   Pincus Large, DO  diazepam (VALIUM) 10 MG tablet Take 1 tablet by mouth 3 (three) times daily. 08/05/13   Historical Provider, MD  diltiazem (CARDIZEM CD) 240 MG 24 hr capsule Take 1 capsule (240 mg total) by mouth daily. 08/12/13   Lennette Bihari, MD  ezetimibe (ZETIA) 10 MG tablet Take 1 tablet (10 mg total) by mouth daily. <PLEASE MAKE APPOINTMENT FOR REFILLS> 04/20/15   Lennette Bihari, MD  furosemide (LASIX) 80 MG tablet 1 tablet twice daily 08/12/13   Lennette Bihari, MD  gabapentin (NEURONTIN) 600 MG tablet Take 600 mg by mouth 2 (two) times daily.    Historical Provider, MD  glipiZIDE (GLUCOTROL) 5 MG tablet Take 2.5 mg by mouth 2 (two) times daily before a meal.    Historical Provider, MD  levETIRAcetam (KEPPRA) 750 MG tablet Take 750-1,500 mg by mouth 3 (three) times daily. 1 tablet in the morning; 1 tablet at lunch; 2 tablets in the evening    Historical Provider, MD  minoxidil (LONITEN) 2.5 MG tablet Take 5 mg by mouth 2 (two) times daily.    Historical Provider, MD  omega-3 acid ethyl esters (LOVAZA) 1 G capsule Take 2 g by mouth 2 (two) times daily.     Historical Provider, MD  oxyCODONE (ROXICODONE) 15 MG immediate release tablet Take 15 mg by mouth every 8  (eight) hours as needed for pain.  11/20/12   Historical Provider, MD  sildenafil (VIAGRA) 50 MG tablet Take 1 tablet (50 mg total) by mouth daily as needed for erectile dysfunction. 06/03/13   Lennette Bihari, MD  Tamsulosin HCl (FLOMAX) 0.4 MG CAPS Take 0.4 mg by mouth daily.    Historical Provider, MD  tiZANidine (ZANAFLEX) 4 MG tablet Take 1  tablet (4 mg total) by mouth every 8 (eight) hours as needed for muscle spasms. 04/09/14   Tommie Sams, DO  traMADol (ULTRAM) 50 MG tablet Take 1 tablet (50 mg total) by mouth every 6 (six) hours as needed for pain. 12/22/12   Vanetta Mulders, MD  traMADol (ULTRAM) 50 MG tablet Take 1 tablet (50 mg total) by mouth every 8 (eight) hours as needed. 04/09/14   Tommie Sams, DO  traZODone (DESYREL) 150 MG tablet Take 150 mg by mouth at bedtime.    Historical Provider, MD   BP 151/91 mmHg  Pulse 97  Temp(Src) 98.4 F (36.9 C) (Oral)  Resp 16  SpO2 99%   Physical Exam  Constitutional: He is oriented to person, place, and time. He appears well-developed and well-nourished. No distress.  Obese  HENT:  Head: Normocephalic and atraumatic.  Eyes: Conjunctivae are normal. Pupils are equal, round, and reactive to light. Right eye exhibits no discharge. Left eye exhibits no discharge. No scleral icterus.  Neck: Normal range of motion.  Cardiovascular: Normal rate.   Pulmonary/Chest: Effort normal. No respiratory distress.  Abdominal: He exhibits no distension.  Musculoskeletal: Normal range of motion.  Left knee: No obvious swelling or deformity. Moderately tender to palpation over entire knee. FROM of motion.  Left shin: No obvious swelling or deformity. Moderately tender over entire shin, medial and lateral leg. No calf tenderness. Left ankle: No obvious swelling or deformity. Moderately tender over entire ankle. FROM. N/V intact. Left foot:  No obvious swelling or deformity. Mildly tender over dorsal aspect of foot.   Neurological: He is alert and oriented to  person, place, and time.  Gait is antalgic. Walks with cane  Skin: Skin is warm and dry.  Psychiatric: He has a normal mood and affect. His behavior is normal.  Nursing note and vitals reviewed.   ED Course  Procedures (including critical care time)  DIAGNOSTIC STUDIES: Oxygen Saturation is 99% on RA, normal by my interpretation.   COORDINATION OF CARE: 11:15 PM-Discussed next steps with pt including DG tibia/fibula and ankle, and a Toradol shot. Pt verbalized understanding and is agreeable with the plan.   Labs Review Labs Reviewed - No data to display  Imaging Review Dg Tibia/fibula Right  12/03/2015  CLINICAL DATA:  Diffuse right lower extremity pain after fall 2 days prior. EXAM: RIGHT TIBIA AND FIBULA - 2 VIEW COMPARISON:  None. FINDINGS: Tibia and fibula are intact. No acute fracture. Chondrocalcinosis noted about the knee. Mild soft tissue edema. Vascular calcifications are seen. IMPRESSION: Mild soft tissue edema.  No fracture or dislocation. Electronically Signed   By: Rubye Oaks M.D.   On: 12/03/2015 00:21   Dg Ankle Complete Right  12/03/2015  CLINICAL DATA:  Right ankle pain after fall 2 days prior. EXAM: RIGHT ANKLE - COMPLETE 3+ VIEW COMPARISON:  None. FINDINGS: No fracture or dislocation. The alignment and joint spaces are maintained. Ankle mortise is preserved. Mild soft tissue edema. No tibial talar joint effusion. IMPRESSION: Soft tissue edema.  No fracture or dislocation. Electronically Signed   By: Rubye Oaks M.D.   On: 12/03/2015 00:22   Dg Knee Complete 4 Views Right  12/02/2015  CLINICAL DATA:  Status post fall 2 days ago, with right knee pain. Initial encounter. EXAM: RIGHT KNEE - COMPLETE 4+ VIEW COMPARISON:  None. FINDINGS: There is no evidence of fracture or dislocation. The joint spaces are preserved. No significant degenerative change is seen; the patellofemoral joint is grossly unremarkable  in appearance. Mild chondrocalcinosis is noted. A fabella  is seen. A small knee joint effusion is noted. The visualized soft tissues are otherwise unremarkable in appearance. IMPRESSION: 1. No evidence of fracture or dislocation. 2. Small knee joint effusion noted. 3. Mild chondrocalcinosis noted. Electronically Signed   By: Roanna Raider M.D.   On: 12/02/2015 23:36   Dg Foot Complete Right  12/03/2015  CLINICAL DATA:  Right foot pain after fall 2 days prior. EXAM: RIGHT FOOT COMPLETE - 3+ VIEW COMPARISON:  None. FINDINGS: Osteoarthritis of the first metatarsal phalangeal joint. No acute fracture or dislocation. Mild soft tissue edema. Small plantar calcaneal spur and Achilles tendon enthesophyte. IMPRESSION: No acute fracture or subluxation of the right foot. Electronically Signed   By: Rubye Oaks M.D.   On: 12/03/2015 00:23   I have personally reviewed and evaluated these images and lab results as part of my medical decision-making.   EKG Interpretation None      MDM   Final diagnoses:  Right ankle pain  Right knee pain   65 year old male who presents with knee, leg, ankle, foot pain. Xrays are all negative. Percocet x 1 given with moderate relief. Patient advised to follow up with PCP since he having significant difficulty walking and has had multiple falls since his initial injury. Advised to take Tylenol since on review of labs from a year ago SCr was 1.7. Patient is NAD, non-toxic, with stable VS. Patient is informed of clinical course, understands medical decision making process, and agrees with plan. Opportunity for questions provided and all questions answered. Return precautions given.  I personally performed the services described in this documentation, which was scribed in my presence. The recorded information has been reviewed and is accurate.     Bethel Born, PA-C 12/03/15 0144  Jerelyn Scott, MD 12/03/15 [phone]

## 2015-12-02 NOTE — ED Notes (Signed)
Pt from home transported to ED by ptar. Pt reports a fall 1 week ago and right knee and lower leg pain. Pt states that today became worse today. Pt is able to ambulate but painful.

## 2015-12-03 DIAGNOSIS — M25571 Pain in right ankle and joints of right foot: Secondary | ICD-10-CM | POA: Diagnosis not present

## 2015-12-03 DIAGNOSIS — M79661 Pain in right lower leg: Secondary | ICD-10-CM | POA: Diagnosis not present

## 2015-12-03 DIAGNOSIS — S99921A Unspecified injury of right foot, initial encounter: Secondary | ICD-10-CM | POA: Diagnosis not present

## 2015-12-03 NOTE — ED Notes (Signed)
Patient able to ambulate independently  

## 2015-12-23 DIAGNOSIS — I2583 Coronary atherosclerosis due to lipid rich plaque: Secondary | ICD-10-CM | POA: Diagnosis not present

## 2015-12-23 DIAGNOSIS — M4326 Fusion of spine, lumbar region: Secondary | ICD-10-CM | POA: Diagnosis not present

## 2015-12-23 DIAGNOSIS — M1000 Idiopathic gout, unspecified site: Secondary | ICD-10-CM | POA: Diagnosis not present

## 2015-12-23 DIAGNOSIS — I158 Other secondary hypertension: Secondary | ICD-10-CM | POA: Diagnosis not present

## 2016-01-11 ENCOUNTER — Ambulatory Visit (INDEPENDENT_AMBULATORY_CARE_PROVIDER_SITE_OTHER): Payer: Medicare HMO | Admitting: Obstetrics and Gynecology

## 2016-01-11 VITALS — BP 150/80 | HR 106 | Temp 98.0°F | Wt 290.0 lb

## 2016-01-11 DIAGNOSIS — N183 Chronic kidney disease, stage 3 unspecified: Secondary | ICD-10-CM

## 2016-01-11 DIAGNOSIS — M109 Gout, unspecified: Secondary | ICD-10-CM

## 2016-01-11 MED ORDER — COLCHICINE 0.6 MG PO TABS: ORAL_TABLET | ORAL | Status: DC

## 2016-01-11 MED ORDER — ALLOPURINOL 100 MG PO TABS: 400.0000 mg | ORAL_TABLET | Freq: Every day | ORAL | Status: DC

## 2016-01-11 MED ORDER — OXYCODONE HCL 10 MG PO TABS: 10.0000 mg | ORAL_TABLET | Freq: Four times a day (QID) | ORAL | Status: DC | PRN

## 2016-01-11 NOTE — Patient Instructions (Addendum)
Will contact you about blood work Increased allopurinol Only take colchicine as prescribed. No more than 3 days in a row for flare Contact office if flare not resolved in one week or symptoms worsen  Gout Gout is when your joints become red, sore, and swell (inflamed). This is caused by the buildup of uric acid crystals in the joints. Uric acid is a chemical that is normally in the blood. If the level of uric acid gets too high in the blood, these crystals form in your joints and tissues. Over time, these crystals can form into masses near the joints and tissues. These masses can destroy bone and cause the bone to look misshapen (deformed). HOME CARE   Do not take aspirin for pain.  Only take medicine as told by your doctor.  Rest the joint as much as you can. When in bed, keep sheets and blankets off painful areas.  Keep the sore joints raised (elevated).  Put warm or cold packs on painful joints. Use of warm or cold packs depends on which works best for you.  Use crutches if the painful joint is in your leg.  Drink enough fluids to keep your pee (urine) clear or pale yellow. Limit alcohol, sugary drinks, and drinks with fructose in them.  Follow your diet instructions. Pay careful attention to how much protein you eat. Include fruits, vegetables, whole grains, and fat-free or low-fat milk products in your daily diet. Talk to your doctor or dietitian about the use of coffee, vitamin C, and cherries. These may help lower uric acid levels.  Keep a healthy body weight. GET HELP RIGHT AWAY IF:   You have watery poop (diarrhea), throw up (vomit), or have any side effects from medicines.  You do not feel better in 24 hours, or you are getting worse.  Your joint becomes suddenly more tender, and you have chills or a fever. MAKE SURE YOU:   Understand these instructions.  Will watch your condition.  Will get help right away if you are not doing well or get worse.   This information  is not intended to replace advice given to you by your health care provider. Make sure you discuss any questions you have with your health care provider.   Document Released: 03/28/2008 Document Revised: 07/10/2014 Document Reviewed: 01/31/2012 Elsevier Interactive Patient Education [employer]2016 [employer] Inc.

## 2016-01-11 NOTE — Progress Notes (Signed)
Subjective:   Patient ID: [name]., male    DOB: 08/23/1950, 65 y.o.   MRN: [id number]  Patient presents for Same Day Appointment  Chief Complaint  Patient presents with  . Gout    HPI: #Gout: -chronic long-standing issue -continues to have repeated gout flares that occur about every two weeks -states pain is so bad it debilitates him and has him in tears -mostly affects bilateral feet but has affected knees before -denies known precipitants (non alcohol drinker, minimal read meat and seafood,e tc) -currently in a flare with L great toe worse than right -gets flares about every 2 weeks -last received treatment for flare 2 weeks ago. Was given prednisone and oxycodone which helped temporarily but flare came back. (received these at outside office) -Pain severity 9/10 -flares got worse after patient stopped colchicine about 8 mo ago; prior he had been on it 5 years. -takes allopurinol daily  Of note, patient goes to the [facility] and also receives primary care through them   Review of Systems   See HPI for ROS.   Non-smoker  Past medical history, surgical, family, and social history reviewed and updated in the EMR as appropriate.  Objective:  BP 153/86 mmHg  Pulse 106  Temp(Src) 98 F (36.7 C) (Oral)  Wt 290 lb (131.543 kg) Vitals and nursing note reviewed  Physical Exam  Constitutional: He is well-developed, well-nourished, and in no distress.  Cardiovascular: Tachycardia present.   Pulmonary/Chest: Effort normal.  Musculoskeletal: He exhibits edema and tenderness.  Bilateral great toes swollen with mild erythema. Left toe with decreased ROM and pain with palpation.     Assessment & Plan:  1. Gout of multiple sites, unspecified cause, unspecified chronicity Current gout flare of bilateral great toes. Gout currently uncontrolled with multiple episodes a month. Counseled patient on dietary modification to minimize flares. Order placed for uric acid  level. Increased allopurinaol to  daily. With his sever gout may need to be titrated up to 600. Gave limited amount of oxy for pain. Rx also given for colchicine to help minimize flare. Return precatuions given. Handout also provided.  - Uric Acid  2. CHRONIC KIDNEY DISEASE STAGE III (MODERATE) Known CKD. Last listed stage was 3 but patient has not had blood work since 2016 in our system (goes to [place] mainly). Will check kidney function in setting of colchicine provided. Discussed the risk and benefits for patient in setting of renal disease. He would like to proceed with colchicine as that is the only thing that has worked for him in the past.  - BASIC METABOLIC PANEL WITH GFR  Counseled patient on receiving care by multiple providers. This could increase risk of harm.   Meds ordered this encounter  Medications  . colchicine 0.6 MG tablet    Sig: Take 1.2 mg (2 tabs) at the first sign of flare, followed in 1 hour with a single dose of 0.6 mg( 1 tab).    Dispense:  30 tablet    Refill:  0  . allopurinol (ZYLOPRIM) 100 MG tablet    Sig: Take 4 tablets (400 mg total) by mouth daily.    Dispense:  120 tablet    Refill:  2  . oxyCODONE 10 MG TABS    Sig: Take 1 tablet (10 mg total) by mouth every 6 (six) hours as needed for pain.    Dispense:  20 tablet    Refill:  0      Caryl Ada, DO 01/11/2016,  11:36 AM PGY-3, [facility]

## 2016-01-12 ENCOUNTER — Encounter: Payer: Self-pay | Admitting: Obstetrics and Gynecology

## 2016-01-12 LAB — BASIC METABOLIC PANEL WITH GFR
BUN: 18 mg/dL (ref 7–25)
CHLORIDE: 100 mmol/L (ref 98–110)
CO2: 25 mmol/L (ref 20–31)
CREATININE: 1.97 mg/dL — AB (ref 0.70–1.25)
Calcium: 9 mg/dL (ref 8.6–10.3)
GFR, Est African American: 40 mL/min — ABNORMAL LOW (ref >=60)
GFR, Est Non African American: 35 mL/min — ABNORMAL LOW (ref >=60)
GLUCOSE: 187 mg/dL — AB (ref 65–99)
POTASSIUM: 4.3 mmol/L (ref 3.5–5.3)
Sodium: 137 mmol/L (ref 135–146)

## 2016-01-12 LAB — URIC ACID: Uric Acid, Serum: 9.9 mg/dL — ABNORMAL HIGH (ref 4.0–8.0)

## 2016-01-25 DIAGNOSIS — I2583 Coronary atherosclerosis due to lipid rich plaque: Secondary | ICD-10-CM | POA: Diagnosis not present

## 2016-01-25 DIAGNOSIS — M4326 Fusion of spine, lumbar region: Secondary | ICD-10-CM | POA: Diagnosis not present

## 2016-01-25 DIAGNOSIS — M1000 Idiopathic gout, unspecified site: Secondary | ICD-10-CM | POA: Diagnosis not present

## 2016-01-25 DIAGNOSIS — I158 Other secondary hypertension: Secondary | ICD-10-CM | POA: Diagnosis not present

## 2016-03-27 DIAGNOSIS — I2583 Coronary atherosclerosis due to lipid rich plaque: Secondary | ICD-10-CM | POA: Diagnosis not present

## 2016-03-27 DIAGNOSIS — M1000 Idiopathic gout, unspecified site: Secondary | ICD-10-CM | POA: Diagnosis not present

## 2016-03-27 DIAGNOSIS — M4326 Fusion of spine, lumbar region: Secondary | ICD-10-CM | POA: Diagnosis not present

## 2016-03-27 DIAGNOSIS — I158 Other secondary hypertension: Secondary | ICD-10-CM | POA: Diagnosis not present

## 2016-04-07 ENCOUNTER — Ambulatory Visit: Payer: Medicare Other | Admitting: Obstetrics and Gynecology

## 2016-04-26 DIAGNOSIS — M4326 Fusion of spine, lumbar region: Secondary | ICD-10-CM | POA: Diagnosis not present

## 2016-04-26 DIAGNOSIS — I2583 Coronary atherosclerosis due to lipid rich plaque: Secondary | ICD-10-CM | POA: Diagnosis not present

## 2016-04-26 DIAGNOSIS — I158 Other secondary hypertension: Secondary | ICD-10-CM | POA: Diagnosis not present

## 2016-04-26 DIAGNOSIS — M1000 Idiopathic gout, unspecified site: Secondary | ICD-10-CM | POA: Diagnosis not present

## 2016-05-29 DIAGNOSIS — I2583 Coronary atherosclerosis due to lipid rich plaque: Secondary | ICD-10-CM | POA: Diagnosis not present

## 2016-05-29 DIAGNOSIS — M1000 Idiopathic gout, unspecified site: Secondary | ICD-10-CM | POA: Diagnosis not present

## 2016-05-29 DIAGNOSIS — I158 Other secondary hypertension: Secondary | ICD-10-CM | POA: Diagnosis not present

## 2016-05-29 DIAGNOSIS — M4326 Fusion of spine, lumbar region: Secondary | ICD-10-CM | POA: Diagnosis not present

## 2016-06-22 DIAGNOSIS — M4326 Fusion of spine, lumbar region: Secondary | ICD-10-CM | POA: Diagnosis not present

## 2016-06-22 DIAGNOSIS — M1000 Idiopathic gout, unspecified site: Secondary | ICD-10-CM | POA: Diagnosis not present

## 2016-06-22 DIAGNOSIS — I158 Other secondary hypertension: Secondary | ICD-10-CM | POA: Diagnosis not present

## 2016-06-22 DIAGNOSIS — I2583 Coronary atherosclerosis due to lipid rich plaque: Secondary | ICD-10-CM | POA: Diagnosis not present

## 2016-07-10 ENCOUNTER — Inpatient Hospital Stay (HOSPITAL_COMMUNITY)
Admission: EM | Admit: 2016-07-10 | Discharge: 2016-07-24 | DRG: 870 | Disposition: A | Discharge status: Hospice | Payer: Medicare PPO | Attending: Family Medicine | Admitting: Family Medicine

## 2016-07-10 ENCOUNTER — Encounter (HOSPITAL_COMMUNITY): Payer: Self-pay

## 2016-07-10 ENCOUNTER — Emergency Department (HOSPITAL_COMMUNITY): Payer: Medicare PPO

## 2016-07-10 DIAGNOSIS — Z452 Encounter for adjustment and management of vascular access device: Secondary | ICD-10-CM

## 2016-07-10 DIAGNOSIS — G8191 Hemiplegia, unspecified affecting right dominant side: Secondary | ICD-10-CM | POA: Diagnosis present

## 2016-07-10 DIAGNOSIS — B1920 Unspecified viral hepatitis C without hepatic coma: Secondary | ICD-10-CM | POA: Diagnosis present

## 2016-07-10 DIAGNOSIS — I639 Cerebral infarction, unspecified: Secondary | ICD-10-CM

## 2016-07-10 DIAGNOSIS — I959 Hypotension, unspecified: Secondary | ICD-10-CM | POA: Diagnosis not present

## 2016-07-10 DIAGNOSIS — R32 Unspecified urinary incontinence: Secondary | ICD-10-CM | POA: Diagnosis present

## 2016-07-10 DIAGNOSIS — G40909 Epilepsy, unspecified, not intractable, without status epilepticus: Secondary | ICD-10-CM | POA: Diagnosis present

## 2016-07-10 DIAGNOSIS — R471 Dysarthria and anarthria: Secondary | ICD-10-CM | POA: Diagnosis present

## 2016-07-10 DIAGNOSIS — J9601 Acute respiratory failure with hypoxia: Secondary | ICD-10-CM | POA: Diagnosis not present

## 2016-07-10 DIAGNOSIS — G936 Cerebral edema: Secondary | ICD-10-CM

## 2016-07-10 DIAGNOSIS — E785 Hyperlipidemia, unspecified: Secondary | ICD-10-CM | POA: Diagnosis present

## 2016-07-10 DIAGNOSIS — I130 Hypertensive heart and chronic kidney disease with heart failure and stage 1 through stage 4 chronic kidney disease, or unspecified chronic kidney disease: Secondary | ICD-10-CM | POA: Diagnosis present

## 2016-07-10 DIAGNOSIS — J9600 Acute respiratory failure, unspecified whether with hypoxia or hypercapnia: Secondary | ICD-10-CM

## 2016-07-10 DIAGNOSIS — N179 Acute kidney failure, unspecified: Secondary | ICD-10-CM | POA: Diagnosis present

## 2016-07-10 DIAGNOSIS — J189 Pneumonia, unspecified organism: Secondary | ICD-10-CM | POA: Diagnosis present

## 2016-07-10 DIAGNOSIS — M1A9XX0 Chronic gout, unspecified, without tophus (tophi): Secondary | ICD-10-CM | POA: Diagnosis present

## 2016-07-10 DIAGNOSIS — Z833 Family history of diabetes mellitus: Secondary | ICD-10-CM

## 2016-07-10 DIAGNOSIS — N183 Chronic kidney disease, stage 3 (moderate): Secondary | ICD-10-CM | POA: Diagnosis present

## 2016-07-10 DIAGNOSIS — R29810 Facial weakness: Secondary | ICD-10-CM | POA: Diagnosis present

## 2016-07-10 DIAGNOSIS — E662 Morbid (severe) obesity with alveolar hypoventilation: Secondary | ICD-10-CM | POA: Diagnosis present

## 2016-07-10 DIAGNOSIS — I248 Other forms of acute ischemic heart disease: Secondary | ICD-10-CM | POA: Diagnosis present

## 2016-07-10 DIAGNOSIS — R4701 Aphasia: Secondary | ICD-10-CM | POA: Diagnosis present

## 2016-07-10 DIAGNOSIS — I509 Heart failure, unspecified: Secondary | ICD-10-CM

## 2016-07-10 DIAGNOSIS — E7800 Pure hypercholesterolemia, unspecified: Secondary | ICD-10-CM | POA: Diagnosis present

## 2016-07-10 DIAGNOSIS — I5032 Chronic diastolic (congestive) heart failure: Secondary | ICD-10-CM | POA: Diagnosis present

## 2016-07-10 DIAGNOSIS — Z885 Allergy status to narcotic agent status: Secondary | ICD-10-CM

## 2016-07-10 DIAGNOSIS — I429 Cardiomyopathy, unspecified: Secondary | ICD-10-CM | POA: Diagnosis present

## 2016-07-10 DIAGNOSIS — D649 Anemia, unspecified: Secondary | ICD-10-CM | POA: Diagnosis present

## 2016-07-10 DIAGNOSIS — J440 Chronic obstructive pulmonary disease with acute lower respiratory infection: Secondary | ICD-10-CM | POA: Diagnosis present

## 2016-07-10 DIAGNOSIS — Z7982 Long term (current) use of aspirin: Secondary | ICD-10-CM

## 2016-07-10 DIAGNOSIS — I2510 Atherosclerotic heart disease of native coronary artery without angina pectoris: Secondary | ICD-10-CM | POA: Diagnosis present

## 2016-07-10 DIAGNOSIS — E860 Dehydration: Secondary | ICD-10-CM | POA: Diagnosis present

## 2016-07-10 DIAGNOSIS — R451 Restlessness and agitation: Secondary | ICD-10-CM | POA: Diagnosis not present

## 2016-07-10 DIAGNOSIS — T68XXXA Hypothermia, initial encounter: Secondary | ICD-10-CM | POA: Diagnosis present

## 2016-07-10 DIAGNOSIS — I63412 Cerebral infarction due to embolism of left middle cerebral artery: Secondary | ICD-10-CM | POA: Diagnosis present

## 2016-07-10 DIAGNOSIS — Z8249 Family history of ischemic heart disease and other diseases of the circulatory system: Secondary | ICD-10-CM

## 2016-07-10 DIAGNOSIS — R2990 Unspecified symptoms and signs involving the nervous system: Secondary | ICD-10-CM

## 2016-07-10 DIAGNOSIS — E872 Acidosis: Secondary | ICD-10-CM | POA: Diagnosis present

## 2016-07-10 DIAGNOSIS — I10 Essential (primary) hypertension: Secondary | ICD-10-CM | POA: Diagnosis not present

## 2016-07-10 DIAGNOSIS — I252 Old myocardial infarction: Secondary | ICD-10-CM

## 2016-07-10 DIAGNOSIS — M5430 Sciatica, unspecified side: Secondary | ICD-10-CM | POA: Diagnosis present

## 2016-07-10 DIAGNOSIS — M7989 Other specified soft tissue disorders: Secondary | ICD-10-CM | POA: Diagnosis not present

## 2016-07-10 DIAGNOSIS — Z9114 Patient's other noncompliance with medication regimen: Secondary | ICD-10-CM

## 2016-07-10 DIAGNOSIS — E1110 Type 2 diabetes mellitus with ketoacidosis without coma: Secondary | ICD-10-CM | POA: Diagnosis present

## 2016-07-10 DIAGNOSIS — I6330 Cerebral infarction due to thrombosis of unspecified cerebral artery: Secondary | ICD-10-CM | POA: Insufficient documentation

## 2016-07-10 DIAGNOSIS — E1122 Type 2 diabetes mellitus with diabetic chronic kidney disease: Secondary | ICD-10-CM | POA: Diagnosis present

## 2016-07-10 DIAGNOSIS — Z781 Physical restraint status: Secondary | ICD-10-CM

## 2016-07-10 DIAGNOSIS — Z66 Do not resuscitate: Secondary | ICD-10-CM | POA: Diagnosis not present

## 2016-07-10 DIAGNOSIS — Z9289 Personal history of other medical treatment: Secondary | ICD-10-CM

## 2016-07-10 DIAGNOSIS — N400 Enlarged prostate without lower urinary tract symptoms: Secondary | ICD-10-CM | POA: Diagnosis present

## 2016-07-10 DIAGNOSIS — R4182 Altered mental status, unspecified: Secondary | ICD-10-CM

## 2016-07-10 DIAGNOSIS — Z01818 Encounter for other preprocedural examination: Secondary | ICD-10-CM

## 2016-07-10 DIAGNOSIS — R011 Cardiac murmur, unspecified: Secondary | ICD-10-CM | POA: Diagnosis present

## 2016-07-10 DIAGNOSIS — A419 Sepsis, unspecified organism: Principal | ICD-10-CM | POA: Diagnosis present

## 2016-07-10 DIAGNOSIS — R000 Tachycardia, unspecified: Secondary | ICD-10-CM | POA: Diagnosis present

## 2016-07-10 DIAGNOSIS — R739 Hyperglycemia, unspecified: Secondary | ICD-10-CM

## 2016-07-10 DIAGNOSIS — E876 Hypokalemia: Secondary | ICD-10-CM | POA: Diagnosis not present

## 2016-07-10 DIAGNOSIS — R509 Fever, unspecified: Secondary | ICD-10-CM | POA: Diagnosis not present

## 2016-07-10 DIAGNOSIS — I6521 Occlusion and stenosis of right carotid artery: Secondary | ICD-10-CM | POA: Diagnosis present

## 2016-07-10 DIAGNOSIS — J9811 Atelectasis: Secondary | ICD-10-CM | POA: Diagnosis present

## 2016-07-10 DIAGNOSIS — J9690 Respiratory failure, unspecified, unspecified whether with hypoxia or hypercapnia: Secondary | ICD-10-CM

## 2016-07-10 DIAGNOSIS — E7220 Disorder of urea cycle metabolism, unspecified: Secondary | ICD-10-CM

## 2016-07-10 DIAGNOSIS — E870 Hyperosmolality and hypernatremia: Secondary | ICD-10-CM | POA: Diagnosis not present

## 2016-07-10 DIAGNOSIS — Z8673 Personal history of transient ischemic attack (TIA), and cerebral infarction without residual deficits: Secondary | ICD-10-CM

## 2016-07-10 DIAGNOSIS — E875 Hyperkalemia: Secondary | ICD-10-CM | POA: Diagnosis present

## 2016-07-10 DIAGNOSIS — I6789 Other cerebrovascular disease: Secondary | ICD-10-CM | POA: Diagnosis not present

## 2016-07-10 DIAGNOSIS — K219 Gastro-esophageal reflux disease without esophagitis: Secondary | ICD-10-CM | POA: Diagnosis present

## 2016-07-10 DIAGNOSIS — G9341 Metabolic encephalopathy: Secondary | ICD-10-CM | POA: Diagnosis not present

## 2016-07-10 DIAGNOSIS — Z7984 Long term (current) use of oral hypoglycemic drugs: Secondary | ICD-10-CM

## 2016-07-10 DIAGNOSIS — Z515 Encounter for palliative care: Secondary | ICD-10-CM | POA: Diagnosis not present

## 2016-07-10 DIAGNOSIS — Z7951 Long term (current) use of inhaled steroids: Secondary | ICD-10-CM

## 2016-07-10 DIAGNOSIS — E871 Hypo-osmolality and hyponatremia: Secondary | ICD-10-CM | POA: Diagnosis present

## 2016-07-10 DIAGNOSIS — E1310 Other specified diabetes mellitus with ketoacidosis without coma: Secondary | ICD-10-CM | POA: Diagnosis not present

## 2016-07-10 DIAGNOSIS — G9340 Encephalopathy, unspecified: Secondary | ICD-10-CM

## 2016-07-10 DIAGNOSIS — E1140 Type 2 diabetes mellitus with diabetic neuropathy, unspecified: Secondary | ICD-10-CM | POA: Diagnosis present

## 2016-07-10 DIAGNOSIS — E869 Volume depletion, unspecified: Secondary | ICD-10-CM | POA: Diagnosis not present

## 2016-07-10 DIAGNOSIS — I6312 Cerebral infarction due to embolism of basilar artery: Secondary | ICD-10-CM | POA: Diagnosis not present

## 2016-07-10 DIAGNOSIS — Z6838 Body mass index (BMI) 38.0-38.9, adult: Secondary | ICD-10-CM

## 2016-07-10 LAB — BASIC METABOLIC PANEL
Anion gap: 14 (ref 5–15)
Anion gap: 12 (ref 5–15)
Anion gap: 11 (ref 5–15)
BUN: 37 mg/dL — AB (ref 6–20)
BUN: 38 mg/dL — AB (ref 6–20)
BUN: 39 mg/dL — AB (ref 6–20)
CHLORIDE: 103 mmol/L (ref 101–111)
CHLORIDE: 110 mmol/L (ref 101–111)
CHLORIDE: 111 mmol/L (ref 101–111)
CO2: 18 mmol/L — AB (ref 22–32)
CO2: 20 mmol/L — AB (ref 22–32)
CO2: 21 mmol/L — AB (ref 22–32)
CREATININE: 3.43 mg/dL — AB (ref 0.61–1.24)
CREATININE: 3.42 mg/dL — AB (ref 0.61–1.24)
CREATININE: 3.58 mg/dL — AB (ref 0.61–1.24)
Calcium: 10.4 mg/dL — ABNORMAL HIGH (ref 8.9–10.3)
Calcium: 10.3 mg/dL (ref 8.9–10.3)
Calcium: 9.8 mg/dL (ref 8.9–10.3)
GFR calc Af Amer: 20 mL/min — ABNORMAL LOW (ref >60)
GFR calc Af Amer: 20 mL/min — ABNORMAL LOW (ref >60)
GFR calc Af Amer: 19 mL/min — ABNORMAL LOW (ref >60)
GFR calc non Af Amer: 17 mL/min — ABNORMAL LOW (ref >60)
GFR calc non Af Amer: 17 mL/min — ABNORMAL LOW (ref >60)
GFR calc non Af Amer: 16 mL/min — ABNORMAL LOW (ref >60)
GLUCOSE: 524 mg/dL — AB (ref 65–99)
Glucose, Bld: 181 mg/dL — ABNORMAL HIGH (ref 65–99)
Glucose, Bld: 115 mg/dL — ABNORMAL HIGH (ref 65–99)
POTASSIUM: 4.5 mmol/L (ref 3.5–5.1)
POTASSIUM: 3.9 mmol/L (ref 3.5–5.1)
Potassium: 4.3 mmol/L (ref 3.5–5.1)
SODIUM: 135 mmol/L (ref 135–145)
Sodium: 142 mmol/L (ref 135–145)
Sodium: 143 mmol/L (ref 135–145)

## 2016-07-10 LAB — I-STAT ARTERIAL BLOOD GAS, ED
Acid-base deficit: 6 mmol/L — ABNORMAL HIGH (ref 0.0–2.0)
BICARBONATE: 19.6 mmol/L — AB (ref 20.0–28.0)
O2 Saturation: 91 %
PCO2 ART: 40.8 mmHg (ref 32.0–48.0)
PO2 ART: 72 mmHg — AB (ref 83.0–108.0)
Patient temperature: 101.1
TCO2: 21 mmol/L (ref 0–100)
pH, Arterial: 7.297 — ABNORMAL LOW (ref 7.350–7.450)

## 2016-07-10 LAB — CBG MONITORING, ED
GLUCOSE-CAPILLARY: 249 mg/dL — AB (ref 65–99)
GLUCOSE-CAPILLARY: 256 mg/dL — AB (ref 65–99)
Glucose-Capillary: >600 mg/dL (ref 65–99)
Glucose-Capillary: >600 mg/dL (ref 65–99)
Glucose-Capillary: 446 mg/dL — ABNORMAL HIGH (ref 65–99)
Glucose-Capillary: 314 mg/dL — ABNORMAL HIGH (ref 65–99)
Glucose-Capillary: 194 mg/dL — ABNORMAL HIGH (ref 65–99)
Glucose-Capillary: 172 mg/dL — ABNORMAL HIGH (ref 65–99)
Glucose-Capillary: 196 mg/dL — ABNORMAL HIGH (ref 65–99)

## 2016-07-10 LAB — CBC WITH DIFFERENTIAL/PLATELET
BASOS ABS: 0.1 10*3/uL (ref 0.0–0.1)
BASOS PCT: 0 %
EOS PCT: 0 %
Eosinophils Absolute: 0 10*3/uL (ref 0.0–0.7)
HEMATOCRIT: 46.7 % (ref 39.0–52.0)
Hemoglobin: 16.9 g/dL (ref 13.0–17.0)
Lymphocytes Relative: 9 %
Lymphs Abs: 1.6 10*3/uL (ref 0.7–4.0)
MCH: 25.7 pg — ABNORMAL LOW (ref 26.0–34.0)
MCHC: 36.2 g/dL — ABNORMAL HIGH (ref 30.0–36.0)
MCV: 71.1 fL — ABNORMAL LOW (ref 78.0–100.0)
MONO ABS: 1 10*3/uL (ref 0.1–1.0)
Monocytes Relative: 6 %
NEUTROS ABS: 15.1 10*3/uL — AB (ref 1.7–7.7)
Neutrophils Relative %: 85 %
PLATELETS: 430 10*3/uL — AB (ref 150–400)
RBC: 6.57 MIL/uL — ABNORMAL HIGH (ref 4.22–5.81)
RDW: 15.7 % — AB (ref 11.5–15.5)
WBC: 17.7 10*3/uL — ABNORMAL HIGH (ref 4.0–10.5)

## 2016-07-10 LAB — URINALYSIS, ROUTINE W REFLEX MICROSCOPIC
BILIRUBIN URINE: NEGATIVE
Bacteria, UA: NONE SEEN
KETONES UR: NEGATIVE mg/dL
LEUKOCYTES UA: NEGATIVE
NITRITE: NEGATIVE
PH: 5 (ref 5.0–8.0)
Protein, ur: 100 mg/dL — AB
Specific Gravity, Urine: 1.026 (ref 1.005–1.030)
Squamous Epithelial / LPF: NONE SEEN

## 2016-07-10 LAB — COMPREHENSIVE METABOLIC PANEL
ALBUMIN: 4.6 g/dL (ref 3.5–5.0)
ALK PHOS: 165 U/L — AB (ref 38–126)
ALT: 32 U/L (ref 17–63)
ANION GAP: 17 — AB (ref 5–15)
AST: 49 U/L — ABNORMAL HIGH (ref 15–41)
BILIRUBIN TOTAL: 1.5 mg/dL — AB (ref 0.3–1.2)
BUN: 39 mg/dL — ABNORMAL HIGH (ref 6–20)
CALCIUM: 10.2 mg/dL (ref 8.9–10.3)
CO2: 18 mmol/L — ABNORMAL LOW (ref 22–32)
Chloride: 85 mmol/L — ABNORMAL LOW (ref 101–111)
Creatinine, Ser: 3.43 mg/dL — ABNORMAL HIGH (ref 0.61–1.24)
GFR calc Af Amer: 20 mL/min — ABNORMAL LOW (ref >60)
GFR calc non Af Amer: 17 mL/min — ABNORMAL LOW (ref >60)
GLUCOSE: 1141 mg/dL — AB (ref 65–99)
Potassium: 6.4 mmol/L (ref 3.5–5.1)
Sodium: 120 mmol/L — ABNORMAL LOW (ref 135–145)
TOTAL PROTEIN: 8.6 g/dL — AB (ref 6.5–8.1)

## 2016-07-10 LAB — I-STAT CG4 LACTIC ACID, ED: Lactic Acid, Venous: 3.96 mmol/L (ref 0.5–1.9)

## 2016-07-10 LAB — PROTIME-INR
INR: 1.19
PROTHROMBIN TIME: 15.1 s (ref 11.4–15.2)

## 2016-07-10 LAB — BRAIN NATRIURETIC PEPTIDE
B Natriuretic Peptide: 59.6 pg/mL (ref 0.0–100.0)
B Natriuretic Peptide: 77.7 pg/mL (ref 0.0–100.0)

## 2016-07-10 LAB — GLUCOSE, CAPILLARY: Glucose-Capillary: 100 mg/dL — ABNORMAL HIGH (ref 65–99)

## 2016-07-10 LAB — RAPID URINE DRUG SCREEN, HOSP PERFORMED
Amphetamines: NOT DETECTED
BARBITURATES: NOT DETECTED
BENZODIAZEPINES: NOT DETECTED
Cocaine: NOT DETECTED
Opiates: NOT DETECTED
Tetrahydrocannabinol: NOT DETECTED

## 2016-07-10 LAB — I-STAT TROPONIN, ED: Troponin i, poc: 0.13 ng/mL (ref 0.00–0.08)

## 2016-07-10 LAB — BETA-HYDROXYBUTYRIC ACID: Beta-Hydroxybutyric Acid: 1.49 mmol/L — ABNORMAL HIGH (ref 0.05–0.27)

## 2016-07-10 LAB — D-DIMER, QUANTITATIVE: D-Dimer, Quant: 0.83 ug/mL-FEU — ABNORMAL HIGH (ref 0.00–0.50)

## 2016-07-10 LAB — TROPONIN I
TROPONIN I: 0.18 ng/mL — AB (ref <0.03)
Troponin I: 0.19 ng/mL (ref <0.03)

## 2016-07-10 LAB — LACTIC ACID, PLASMA
Lactic Acid, Venous: 3.6 mmol/L (ref 0.5–1.9)
Lactic Acid, Venous: 2.9 mmol/L (ref 0.5–1.9)

## 2016-07-10 LAB — ETHANOL

## 2016-07-10 LAB — TSH: TSH: 1.661 u[IU]/mL (ref 0.350–4.500)

## 2016-07-10 MED ORDER — LEVETIRACETAM 750 MG PO TABS: 750.0000 mg | ORAL_TABLET | Freq: Three times a day (TID) | ORAL | Status: DC

## 2016-07-10 MED ORDER — ALLOPURINOL 100 MG PO TABS
100.0000 mg | ORAL_TABLET | Freq: Every day | ORAL | Status: DC
  Filled 2016-07-10: qty 1

## 2016-07-10 MED ORDER — SODIUM CHLORIDE 0.9 % IV BOLUS (SEPSIS): 1000.0000 mL | Freq: Once | INTRAVENOUS | Status: DC

## 2016-07-10 MED ORDER — AZITHROMYCIN 500 MG PO TABS: 500.0000 mg | ORAL_TABLET | ORAL | Status: DC

## 2016-07-10 MED ORDER — VANCOMYCIN HCL 10 G IV SOLR
2000.0000 mg | Freq: Once | INTRAVENOUS | Status: DC
  Administered 2016-07-10: 2000 mg via INTRAVENOUS
  Filled 2016-07-10: qty 2000

## 2016-07-10 MED ORDER — ENOXAPARIN SODIUM 30 MG/0.3ML ~~LOC~~ SOLN
30.0000 mg | SUBCUTANEOUS | Status: DC
  Administered 2016-07-10: 30 mg via SUBCUTANEOUS
  Filled 2016-07-10: qty 0.3

## 2016-07-10 MED ORDER — SODIUM CHLORIDE 0.9 % IV BOLUS (SEPSIS)
1000.0000 mL | Freq: Once | INTRAVENOUS | Status: AC
  Administered 2016-07-10: 1000 mL via INTRAVENOUS

## 2016-07-10 MED ORDER — INSULIN REGULAR BOLUS VIA INFUSION
0.0000 [IU] | Freq: Three times a day (TID) | INTRAVENOUS | Status: DC
  Filled 2016-07-10: qty 10

## 2016-07-10 MED ORDER — CLOPIDOGREL BISULFATE 75 MG PO TABS: 75.0000 mg | ORAL_TABLET | Freq: Every day | ORAL | Status: DC

## 2016-07-10 MED ORDER — SODIUM CHLORIDE 0.9 % IV SOLN: INTRAVENOUS | Status: DC

## 2016-07-10 MED ORDER — PIPERACILLIN-TAZOBACTAM 3.375 G IVPB 30 MIN
3.3750 g | Freq: Once | INTRAVENOUS | Status: AC
  Administered 2016-07-10: 3.375 g via INTRAVENOUS
  Filled 2016-07-10: qty 50

## 2016-07-10 MED ORDER — GABAPENTIN 600 MG PO TABS
600.0000 mg | ORAL_TABLET | Freq: Two times a day (BID) | ORAL | Status: DC
  Filled 2016-07-10: qty 1

## 2016-07-10 MED ORDER — CARVEDILOL 25 MG PO TABS: 25.0000 mg | ORAL_TABLET | Freq: Two times a day (BID) | ORAL | Status: DC

## 2016-07-10 MED ORDER — SODIUM CHLORIDE 0.9 % IV SOLN
1500.0000 mg | Freq: Two times a day (BID) | INTRAVENOUS | Status: DC
  Administered 2016-07-10 – 2016-07-11 (×3): 1500 mg via INTRAVENOUS
  Filled 2016-07-10 (×4): qty 15

## 2016-07-10 MED ORDER — DILTIAZEM HCL ER COATED BEADS 240 MG PO CP24
240.0000 mg | ORAL_CAPSULE | Freq: Every day | ORAL | Status: DC
  Filled 2016-07-10 (×2): qty 1

## 2016-07-10 MED ORDER — ATORVASTATIN CALCIUM 40 MG PO TABS
40.0000 mg | ORAL_TABLET | Freq: Every day | ORAL | Status: DC
  Filled 2016-07-10: qty 1

## 2016-07-10 MED ORDER — DEXTROSE 5 % IV SOLN
1.0000 g | INTRAVENOUS | Status: DC
  Administered 2016-07-10: 1 g via INTRAVENOUS
  Filled 2016-07-10: qty 10

## 2016-07-10 MED ORDER — DEXTROSE 50 % IV SOLN: 25.0000 mL | INTRAVENOUS | Status: DC | PRN

## 2016-07-10 MED ORDER — SODIUM CHLORIDE 0.9 % IV SOLN
INTRAVENOUS | Status: DC
  Administered 2016-07-10: 21:00:00 via INTRAVENOUS

## 2016-07-10 MED ORDER — DIAZEPAM 2 MG PO TABS: 2.0000 mg | ORAL_TABLET | Freq: Three times a day (TID) | ORAL | Status: DC

## 2016-07-10 MED ORDER — PIPERACILLIN-TAZOBACTAM 3.375 G IVPB
3.3750 g | Freq: Three times a day (TID) | INTRAVENOUS | Status: DC
  Administered 2016-07-10: 3.375 g via INTRAVENOUS
  Filled 2016-07-10: qty 50

## 2016-07-10 MED ORDER — DEXTROSE-NACL 5-0.45 % IV SOLN: INTRAVENOUS | Status: DC

## 2016-07-10 MED ORDER — LATANOPROST 0.005 % OP SOLN
1.0000 [drp] | Freq: Every day | OPHTHALMIC | Status: DC
  Administered 2016-07-10 – 2016-07-22 (×13): 1 [drp] via OPHTHALMIC
  Filled 2016-07-10 (×3): qty 2.5

## 2016-07-10 MED ORDER — SODIUM CHLORIDE 0.9 % IV SOLN
INTRAVENOUS | Status: DC
  Administered 2016-07-10: 5.4 [IU]/h via INTRAVENOUS
  Filled 2016-07-10: qty 2.5

## 2016-07-10 MED ORDER — ALBUTEROL SULFATE (2.5 MG/3ML) 0.083% IN NEBU: 3.0000 mL | INHALATION_SOLUTION | Freq: Four times a day (QID) | RESPIRATORY_TRACT | Status: DC | PRN

## 2016-07-10 MED ORDER — MINOXIDIL 2.5 MG PO TABS
5.0000 mg | ORAL_TABLET | Freq: Two times a day (BID) | ORAL | Status: DC
  Filled 2016-07-10 (×2): qty 2

## 2016-07-10 MED ORDER — SODIUM CHLORIDE 0.9 % IV SOLN
INTRAVENOUS | Status: DC
  Administered 2016-07-10: 7.6 [IU]/h via INTRAVENOUS
  Filled 2016-07-10: qty 2.5

## 2016-07-10 MED ORDER — ASPIRIN EC 81 MG PO TBEC: 81.0000 mg | DELAYED_RELEASE_TABLET | Freq: Every day | ORAL | Status: DC

## 2016-07-10 MED ORDER — VANCOMYCIN HCL IN DEXTROSE 1-5 GM/200ML-% IV SOLN
1000.0000 mg | Freq: Once | INTRAVENOUS | Status: DC
  Filled 2016-07-10: qty 200

## 2016-07-10 MED ORDER — TAMSULOSIN HCL 0.4 MG PO CAPS: 0.4000 mg | ORAL_CAPSULE | Freq: Every day | ORAL | Status: DC

## 2016-07-10 MED ORDER — DEXTROSE-NACL 5-0.45 % IV SOLN
INTRAVENOUS | Status: DC
  Administered 2016-07-10: 18:00:00 via INTRAVENOUS

## 2016-07-10 MED ORDER — MOMETASONE FURO-FORMOTEROL FUM 100-5 MCG/ACT IN AERO
2.0000 | INHALATION_SPRAY | Freq: Two times a day (BID) | RESPIRATORY_TRACT | Status: DC
  Administered 2016-07-10: 2 via RESPIRATORY_TRACT
  Filled 2016-07-10: qty 8.8

## 2016-07-10 MED ORDER — ALBUTEROL SULFATE HFA 108 (90 BASE) MCG/ACT IN AERS: 2.0000 | INHALATION_SPRAY | Freq: Four times a day (QID) | RESPIRATORY_TRACT | Status: DC | PRN

## 2016-07-10 NOTE — ED Notes (Signed)
POCT CBG resulted 172; Alphonzo LemmingsWhitney, RN aware

## 2016-07-10 NOTE — ED Triage Notes (Signed)
Pt brought in by EMS due to having a AMS since the 1st, but has gotten worst this morning. Pt is a&ox1. Pt follows commands. Per EMS pt is not taking prescribed meds..Marland Kitchen

## 2016-07-10 NOTE — Progress Notes (Signed)
Paged teaching service intern for orders for patient arriving to 2S06. Notified of patient lethargic and increased work of breathing. Orders received. Will continue to monitor patient.   Horton ChinMacKayla A Rubalcava, RN

## 2016-07-10 NOTE — ED Notes (Signed)
Dr. Rush Landmarkegeler notified of 6.4 potassium and 1,141 glucose lab results.

## 2016-07-10 NOTE — ED Notes (Signed)
ACTIVATED CODE SEPSIS WITH CARELINK @ 08:23 AM

## 2016-07-10 NOTE — ED Notes (Signed)
ED Provider at bedside. 

## 2016-07-10 NOTE — ED Notes (Signed)
Pt had episode of urinary incontinence of bed/self/floor. Pt was moved to hospital bed & cleaned pt. New lines and gown. Condom cath placed on pt. Family member given recliner chair.

## 2016-07-10 NOTE — ED Provider Notes (Signed)
[facility]-EMERGENCY DEPT Provider Note   CSN: [id number] Arrival date & time: 07/10/16  0753     History   Chief Complaint Chief Complaint  Patient presents with  . Altered Mental Status  AMS, hypoxia, incontinence of urine, fever  HPI Cristal FordLouis Shuart Jr. is a 66 y.o. male with a past medical history significant for CAD with prior MI, CVA, TIA, diabetes, COPD with history of pneumonias, hypertension, hyperlipidemia, and documented cardiomyopathy on Lasix who presents for worsening altered mental status, fever, incontinence of urine, hypoxia, and EMS report of aphasia. According to family's with EMS, patient has been having worsened altered mental status for the last 8 days. They report the patient has had acute worsening over the last 24 hours. Patient was reportedly found this morning wearing only pants and one shoe sitting in his car outside while he was off. Of note, this morning was 7 degrees outside. EMS says that patient was having incontinence of urine. Patient is altered and is not alert and oriented. Patient is following commands intermittently. Patient is able to answer yes no questions but does not give long answers. Patient denies chest pains, palpitations, nausea, vomiting, neck pain, neck stiffness, shortness of breath, cough, or abdominal pain. Patient denies any pain in his extremities. Patient's last normal was January 1, 8 days ago.   According to EMS, pt has no hx of dementia.   LVL 5 caveat for AMS   The history is provided by the EMS personnel, medical records and the patient. The history is limited by the condition of the patient.  Altered Mental Status   This is a new problem. The current episode started more than 2 days ago (8days). The problem has been rapidly worsening. Associated symptoms include confusion. His past medical history is significant for diabetes, seizures, CVA, hypertension, COPD and heart disease. His past medical history does not include dementia.    Past  Medical History:  Diagnosis Date  . Cardiomyopathy (HCC)   . COPD (chronic obstructive pulmonary disease) (HCC)   . Coronary atherosclerosis    mild  . Degenerative disc disease   . Diabetes mellitus   . GERD (gastroesophageal reflux disease)   . Gout    uric acid-9.7  . Headache(784.0)   . Heart murmur   . Hepatitis    hep c  . History of colonoscopy   . HTN (hypertension)   . Hyperlipidemia   . Obesity   . Orthostasis   . Pneumonia   . Recurrent upper respiratory infection (URI)   . S/P lumbar fusion   . Seizures (HCC)   . Shortness of breath   . Sleep apnea    uses cpap  . TIA (transient ischemic attack)     Patient Active Problem List   Diagnosis Date Noted  . Gout 01/11/2016  . Back pain with left-sided sciatica 12/11/2014  . Lower extremity edema 06/03/2013  . Diabetes mellitus with stage 3 chronic kidney disease (HCC) 05/03/2013  . CVA (cerebral infarction) 12/28/2011  . CHRONIC KIDNEY DISEASE STAGE III (MODERATE) 01/26/2009  . OBESITY, MORBID 07/19/2007  . ERECTILE DYSFUNCTION, MILD 12/04/2006  . HEPATITIS C 08/30/2006  . HYPERCHOLESTEROLEMIA 08/30/2006  . Uncontrolled hypertension 08/30/2006  . COPD 08/30/2006  . GASTROESOPHAGEAL REFLUX, NO ESOPHAGITIS 08/30/2006  . CONVULSIONS, SEIZURES, NOS 08/30/2006  . APNEA, SLEEP 08/30/2006    Past Surgical History:  Procedure Laterality Date  . 2D echo  12/22/2004  . CARDIAC CATHETERIZATION  04/02/2005  . CPAP  08/14/2005  . LUMBAR  DISC SURGERY  07/03/1997   L5-S1; x2  . MRI  10/25/2005   mod to severe biforaminal narrowing L5-S1  . persantine stress test  01/25/2005  . PFT  09/15/2005   mild to moderate obstructive airway disease  . sleep study  08/14/2005   mod OSA/hypopnea       Home Medications    Prior to Admission medications   Medication Sig Start Date End Date Taking? Authorizing Provider  ADVAIR DISKUS 100-50 MCG/DOSE AEPB INHALE 1 PUFF INTO THE LUNGS TWICE DAILY 04/28/13   Hilarie Fredrickson, MD    albuterol (PROAIR HFA) 108 (90 BASE) MCG/ACT inhaler Inhale 2 puffs into the lungs every 6 (six) hours as needed for wheezing. 12/16/12   Amber Nydia Bouton, MD  albuterol (PROVENTIL,VENTOLIN) 90 MCG/ACT inhaler Inhale 2 puffs into the lungs every 4 (four) hours as needed. 08/04/14 07/30/15  Twana First Hess, DO  allopurinol (ZYLOPRIM) 100 MG tablet Take 4 tablets (400 mg total) by mouth daily. 01/11/16   Pincus Large, DO  aspirin 81 MG tablet Take 81 mg by mouth daily.     Historical Provider, MD  atorvastatin (LIPITOR) 80 MG tablet Take 40 mg by mouth at bedtime.    Historical Provider, MD  Azilsartan Medoxomil (EDARBI) 80 MG TABS Take 1 tablet (80 mg total) by mouth daily. 11/26/14   Lennette Bihari, MD  benzonatate (TESSALON) 200 MG capsule Take 1 capsule (200 mg total) by mouth 3 (three) times daily as needed for cough. 08/04/14   Twana First Hess, DO  carvedilol (COREG) 25 MG tablet Take 50 mg by mouth 2 (two) times daily with a meal.     Historical Provider, MD  chlorpheniramine-HYDROcodone (TUSSIONEX PENNKINETIC ER) 10-8 MG/5ML LQCR Take 5 mLs by mouth every 12 (twelve) hours. 08/11/14   Rolland Porter, MD  Cholecalciferol 2000 UNITS TBDP Take 1 capsule by mouth daily.    Historical Provider, MD  colchicine 0.6 MG tablet Take 1.2 mg (2 tabs) at the first sign of flare, followed in 1 hour with a single dose of 0.6 mg( 1 tab). 01/11/16   Pincus Large, DO  cyclobenzaprine (FLEXERIL) 5 MG tablet Take 1 tablet (5 mg total) by mouth 3 (three) times daily as needed for muscle spasms. 12/11/14   Pincus Large, DO  diazepam (VALIUM) 10 MG tablet Take 1 tablet by mouth 3 (three) times daily. 08/05/13   Historical Provider, MD  diltiazem (CARDIZEM CD) 240 MG 24 hr capsule Take 1 capsule (240 mg total) by mouth daily. 08/12/13   Lennette Bihari, MD  ezetimibe (ZETIA) 10 MG tablet Take 1 tablet (10 mg total) by mouth daily. <PLEASE MAKE APPOINTMENT FOR REFILLS> 04/20/15   Lennette Bihari, MD  furosemide (LASIX) 80 MG tablet 1  tablet twice daily 08/12/13   Lennette Bihari, MD  gabapentin (NEURONTIN) 600 MG tablet Take 600 mg by mouth 2 (two) times daily.    Historical Provider, MD  glipiZIDE (GLUCOTROL) 5 MG tablet Take 2.5 mg by mouth 2 (two) times daily before a meal.    Historical Provider, MD  levETIRAcetam (KEPPRA) 750 MG tablet Take 750-1,500 mg by mouth 3 (three) times daily. 1 tablet in the morning; 1 tablet at lunch; 2 tablets in the evening    Historical Provider, MD  minoxidil (LONITEN) 2.5 MG tablet Take 5 mg by mouth 2 (two) times daily.    Historical Provider, MD  omega-3 acid ethyl esters (LOVAZA) 1 G capsule Take 2  g by mouth 2 (two) times daily.     Historical Provider, MD  oxyCODONE 10 MG TABS Take 1 tablet (10 mg total) by mouth every 6 (six) hours as needed for pain. 01/11/16   Pincus Large, DO  sildenafil (VIAGRA) 50 MG tablet Take 1 tablet (50 mg total) by mouth daily as needed for erectile dysfunction. 06/03/13   Lennette Bihari, MD  Tamsulosin HCl (FLOMAX) 0.4 MG CAPS Take 0.4 mg by mouth daily.    Historical Provider, MD  tiZANidine (ZANAFLEX) 4 MG tablet Take 1 tablet (4 mg total) by mouth every 8 (eight) hours as needed for muscle spasms. 04/09/14   Tommie Sams, DO  traMADol (ULTRAM) 50 MG tablet Take 1 tablet (50 mg total) by mouth every 6 (six) hours as needed for pain. 12/22/12   Vanetta Mulders, MD  traMADol (ULTRAM) 50 MG tablet Take 1 tablet (50 mg total) by mouth every 8 (eight) hours as needed. 04/09/14   Tommie Sams, DO  traZODone (DESYREL) 150 MG tablet Take 150 mg by mouth at bedtime.    Historical Provider, MD    Family History Family History  Problem Relation Age of Onset  . Coronary artery disease Mother   . Hypertension Mother   . Diabetes    . Hypertension Brother     Social History Social History  Substance Use Topics  . Smoking status: Never Smoker  . Smokeless tobacco: Never Used  . Alcohol use No     Allergies   Codeine   Review of Systems Review of Systems    Unable to perform ROS: Mental status change  Constitutional: Positive for fever. Negative for chills and diaphoresis.  Respiratory: Negative for cough, chest tightness, shortness of breath and wheezing.   Cardiovascular: Negative for chest pain.  Gastrointestinal: Negative for abdominal pain, nausea and vomiting.  Genitourinary: Negative for flank pain.  Musculoskeletal: Negative for back pain, neck pain and neck stiffness.  Skin: Negative for wound.  Neurological: Negative for headaches.  Psychiatric/Behavioral: Positive for confusion.  All other systems reviewed and are negative.    Physical Exam Updated Vital Signs BP 153/87 (BP Location: Right Arm)   Pulse 115   Temp 101.2 F (38.4 C) (Oral)   Resp 18   Ht 6\' 2"  (1.88 m)   Wt 300 lb (136.1 kg)   SpO2 98%   BMI 38.52 kg/m   Physical Exam  Constitutional: He appears well-developed and well-nourished. No distress.  HENT:  Head: Normocephalic and atraumatic.  Mouth/Throat: Oropharynx is clear and moist. No oropharyngeal exudate.  Eyes: Conjunctivae and EOM are normal. Pupils are equal, round, and reactive to light.  Neck: Normal range of motion. Neck supple.  Cardiovascular: Regular rhythm and intact distal pulses.  Tachycardia present.   Murmur heard.  Systolic (mild) murmur is present  Pulmonary/Chest: Effort normal and breath sounds normal. No stridor. No respiratory distress. He has no wheezes. He has no rales. He exhibits no tenderness.  Abdominal: Soft. There is no tenderness.  Musculoskeletal: He exhibits no edema or tenderness.  Neurological: He is alert.  Skin: Skin is warm and dry. Capillary refill takes less than 2 seconds. He is not diaphoretic. No erythema. No pallor.  Psychiatric: He has a normal mood and affect.  Nursing note and vitals reviewed.    ED Treatments / Results  Labs (all labs ordered are listed, but only abnormal results are displayed) Labs Reviewed  COMPREHENSIVE METABOLIC PANEL -  Abnormal; Notable for the  following:       Result Value   Sodium 120 (*)    Potassium 6.4 (*)    Chloride 85 (*)    CO2 18 (*)    Glucose, Bld 1,141 (*)    BUN 39 (*)    Creatinine, Ser 3.43 (*)    Total Protein 8.6 (*)    AST 49 (*)    Alkaline Phosphatase 165 (*)    Total Bilirubin 1.5 (*)    GFR calc non Af Amer 17 (*)    GFR calc Af Amer 20 (*)    Anion gap 17 (*)    All other components within normal limits  CBC WITH DIFFERENTIAL/PLATELET - Abnormal; Notable for the following:    WBC 17.7 (*)    RBC 6.57 (*)    MCV 71.1 (*)    MCH 25.7 (*)    MCHC 36.2 (*)    RDW 15.7 (*)    Platelets 430 (*)    Neutro Abs 15.1 (*)    All other components within normal limits  URINALYSIS, ROUTINE W REFLEX MICROSCOPIC - Abnormal; Notable for the following:    Color, Urine STRAW (*)    Glucose, UA >=500 (*)    Hgb urine dipstick LARGE (*)    Protein, ur 100 (*)    All other components within normal limits  D-DIMER, QUANTITATIVE (NOT AT [facility]) - Abnormal; Notable for the following:    D-Dimer, Quant 0.83 (*)    All other components within normal limits  BETA-HYDROXYBUTYRIC ACID - Abnormal; Notable for the following:    Beta-Hydroxybutyric Acid 1.49 (*)    All other components within normal limits  BASIC METABOLIC PANEL - Abnormal; Notable for the following:    CO2 18 (*)    Glucose, Bld 524 (*)    BUN 37 (*)    Creatinine, Ser 3.43 (*)    Calcium 10.4 (*)    GFR calc non Af Amer 17 (*)    GFR calc Af Amer 20 (*)    All other components within normal limits  TROPONIN I - Abnormal; Notable for the following:    Troponin I 0.18 (*)    All other components within normal limits  LACTIC ACID, PLASMA - Abnormal; Notable for the following:    Lactic Acid, Venous 3.6 (*)    All other components within normal limits  I-STAT CG4 LACTIC ACID, ED - Abnormal; Notable for the following:    Lactic Acid, Venous 3.96 (*)    All other components within normal limits  I-STAT TROPOININ, ED -  Abnormal; Notable for the following:    Troponin i, poc 0.13 (*)    All other components within normal limits  CBG MONITORING, ED - Abnormal; Notable for the following:    Glucose-Capillary >600 (*)    All other components within normal limits  I-STAT ARTERIAL BLOOD GAS, ED - Abnormal; Notable for the following:    pH, Arterial 7.297 (*)    pO2, Arterial 72.0 (*)    Bicarbonate 19.6 (*)    Acid-base deficit 6.0 (*)    All other components within normal limits  CBG MONITORING, ED - Abnormal; Notable for the following:    Glucose-Capillary >600 (*)    All other components within normal limits  CBG MONITORING, ED - Abnormal; Notable for the following:    Glucose-Capillary >600 (*)    All other components within normal limits  CBG MONITORING, ED - Abnormal; Notable for the following:  Glucose-Capillary 446 (*)    All other components within normal limits  CBG MONITORING, ED - Abnormal; Notable for the following:    Glucose-Capillary 314 (*)    All other components within normal limits  CULTURE, BLOOD (ROUTINE X 2)  CULTURE, BLOOD (ROUTINE X 2)  URINE CULTURE  BRAIN NATRIURETIC PEPTIDE  PROTIME-INR  BASIC METABOLIC PANEL  BASIC METABOLIC PANEL  TROPONIN I  TROPONIN I  RAPID URINE DRUG SCREEN, HOSP PERFORMED  LACTIC ACID, PLASMA  BRAIN NATRIURETIC PEPTIDE  BASIC METABOLIC PANEL  BASIC METABOLIC PANEL  BASIC METABOLIC PANEL  BASIC METABOLIC PANEL  HEMOGLOBIN A1C  TSH  ETHANOL    EKG  EKG Interpretation  Date/Time:  Monday July 10 2016 07:55:09 EST Ventricular Rate:  115 PR Interval:    QRS Duration: 117 QT Interval:  316 QTC Calculation: 437 R Axis:   31 Text Interpretation:  Sinus tachycardia Probable left atrial enlargement Nonspecific intraventricular conduction delay Anteroseptal infarct, old Abnormal T, consider ischemia, lateral leads When compard to priorm similar T wave morphology seen.  No STEMI Confirmed by Rush Landmark MD, CHRISTOPHER [phone]) on 07/10/2016  8:14:18 AM       Radiology Dg Chest 2 View  Result Date: 07/10/2016 CLINICAL DATA:  Hypoxia, fever, tachycardia and altered mental status. EXAM: CHEST  2 VIEW COMPARISON:  08/11/2014 FINDINGS: Lung volumes are low bilaterally with bilateral lower lung opacities present. Component of bilateral lower lobe pneumonia may be present. It would also be difficult to exclude a component of bilateral pleural effusions. No overt pulmonary edema present. No evidence of pneumothorax. The heart size appears stable. IMPRESSION: Low lung volumes with bilateral lower lung opacities. Component of bilateral lower lobe pneumonia may be present. Electronically Signed   By: Irish Lack M.D.   On: 07/10/2016 09:42   Ct Head Wo Contrast  Result Date: 07/10/2016 CLINICAL DATA:  Blood glucose is over 1,100. Altered mental status. Fever and aphasia. EXAM: CT HEAD WITHOUT CONTRAST TECHNIQUE: Contiguous axial images were obtained from the base of the skull through the vertex without intravenous contrast. COMPARISON:  MR brain 12/30/2011.  CT head 12/28/2011. FINDINGS: The patient was unable to remain motionless for the exam. Small or subtle lesions could be overlooked. Brain: No evidence for acute cerebral infarction, hemorrhage, mass lesion, hydrocephalus, or extra-axial fluid. Mild atrophy. Hypoattenuation of white matter, consistent with small vessel disease. Asymmetric hypodensity LEFT mid pons, see image 10 series 2 for instance, favored to represent Hounsfield artifact. If there are signs and symptoms suggesting brainstem infarction however, recommend MRI brain for further assessment. Vascular: Vascular calcification in the carotid siphons, basilar artery, and LEFT vertebral artery. Skull: Normal. Negative for fracture or focal lesion. Sinuses/Orbits: No acute finding. Other: None. IMPRESSION: Motion degraded exam demonstrating no definite cerebral infarction or hemorrhage. Mild atrophy and small vessel disease, similar to  priors. Asymmetric hypodensity of the LEFT mid pons, favored to represent artifact; if signs and symptoms of brainstem infarction are present, recommend further assessment with MR imaging when the patient is better able to remain motionless. Electronically Signed   By: Elsie Stain M.D.   On: 07/10/2016 10:06    Procedures Procedures (including critical care time)  CRITICAL CARE Performed by: Canary Brim Tegeler Total critical care time: 35 minutes Critical care time was exclusive of separately billable procedures and treating other patients. Critical care was necessary to treat or prevent imminent or life-threatening deterioration. Critical care was time spent personally by me on the following activities: development of treatment  plan with patient and/or surrogate as well as nursing, discussions with consultants, evaluation of patient's response to treatment, examination of patient, obtaining history from patient or surrogate, ordering and performing treatments and interventions, ordering and review of laboratory studies, ordering and review of radiographic studies, pulse oximetry and re-evaluation of patient's condition.    Medications Ordered in ED Medications  sodium chloride 0.9 % bolus 1,000 mL (0 mLs Intravenous Stopped 07/10/16 1340)    And  sodium chloride 0.9 % bolus 1,000 mL (0 mLs Intravenous Stopped 07/10/16 1340)    And  sodium chloride 0.9 % bolus 1,000 mL (0 mLs Intravenous Hold 07/10/16 1337)    And  sodium chloride 0.9 % bolus 1,000 mL (0 mLs Intravenous Hold 07/10/16 1338)  atorvastatin (LIPITOR) tablet 40 mg (not administered)  minoxidil (LONITEN) tablet 5 mg (not administered)  clopidogrel (PLAVIX) tablet 75 mg (not administered)  latanoprost (XALATAN) 0.005 % ophthalmic solution 1 drop (not administered)  allopurinol (ZYLOPRIM) tablet 100 mg (not administered)  diltiazem (CARDIZEM CD) 24 hr capsule 240 mg (not administered)  gabapentin (NEURONTIN) tablet 600 mg (not  administered)  levETIRAcetam (KEPPRA) tablet 750-1,500 mg (not administered)  albuterol (PROVENTIL HFA;VENTOLIN HFA) 108 (90 Base) MCG/ACT inhaler 2 puff (not administered)  carvedilol (COREG) tablet 25 mg (not administered)  mometasone-formoterol (DULERA) 100-5 MCG/ACT inhaler 2 puff (not administered)  tamsulosin (FLOMAX) capsule 0.4 mg (not administered)  aspirin tablet 81 mg (not administered)  0.9 %  sodium chloride infusion (not administered)  dextrose 5 %-0.45 % sodium chloride infusion (not administered)  enoxaparin (LOVENOX) injection 30 mg (not administered)  cefTRIAXone (ROCEPHIN) 1 g in dextrose 5 % 50 mL IVPB (not administered)  azithromycin (ZITHROMAX) tablet 500 mg (not administered)  diazepam (VALIUM) tablet 2 mg (not administered)  insulin regular bolus via infusion 0-10 Units (not administered)  insulin regular (NOVOLIN R,HUMULIN R) 250 Units in sodium chloride 0.9 % 250 mL (1 Units/mL) infusion (7.6 Units/hr Intravenous New Bag/Given 07/10/16 1625)  dextrose 50 % solution 25 mL (not administered)  piperacillin-tazobactam (ZOSYN) IVPB 3.375 g (0 g Intravenous Stopped 07/10/16 1000)  sodium chloride 0.9 % bolus 1,000 mL (1,000 mLs Intravenous New Bag/Given 07/10/16 1602)     Initial Impression / Assessment and Plan / ED Course  I have reviewed the triage vital signs and the nursing notes.  Pertinent labs & imaging results that were available during my care of the patient were reviewed by me and considered in my medical decision making (see chart for details).  Clinical Course     [name]. is a 66 y.o. male with a past medical history significant for CAD with prior MI, CVA, TIA, diabetes, COPD with history of pneumonias, hypertension, hyperlipidemia, and documented cardiomyopathy on Lasix who presents for worsening altered mental status, fever, incontinence of urine, hypoxia, and EMS report of aphasia.  History and exam are seen above.  On exam, patient is febrile  with a temperature of 101.2 orally, is tachycardic in the 120s, and history In the 30s. Patient was hypoxic in the 80s according to nursing who started him on 2 L nasal cannula with improvement in oxygen saturations. Patient is alert but is not answering questions appropriately. He is following some commands but is unable to do finger-nose-finger or raise his legs as directed. He is able to move his feet symmetrically however. Patient denies any sensation complaints and does not have a facial droop. Patient is moving his neck in all directions without apparent pain or nuchal rigidity.  Given the altered mental status, hypoxia, incontinence of urine, and his vital signs, suspect sepsis, possibly from pneumonia or UTI. Patient made a code sepsis and broad-spectrum antibiotics will be ordered. Cultures will be obtained.  Given patient's altered mental status and history of stroke, CT of the head will also be obtained. At this time, doubt meningitis.  8:28 AM EKG obtained and showed no evidence of STEMI. Patient has T-wave inversions in V5 and V6 which although more prominent, are downgoing in previous EKGs.  9:24 AM Nursing reports that patient has several lab abnormalities back on initial lab work. Lactic acid elevated at 3.96. Troponin elevated 0.13. Glucose elevated at 1,141 with a AK I of 3.43 and an elevated potassium of 6.4. Anion gap is elevated at 17 and CO2 is low at 18. Suspect DKA given lab findings that may been prompted by infection. EKG was again reviewed with no peaked T waves. Patient will be started on insulin drip for hyperglycemia and hyperkalemia. Beta hydroxy uric acid and ABG was ordered.   Corrected sodium is 137 given the severe hyperglycemia.  D-dimer also elevated, and setting of hypoxia, patient will likely need workup for possible PE. However, given aki, this is not possible at this time.  On repeat BMP, patient had improvement in glucose as well as potassium. Anion gap  improving.  Patient called for admission for pneumonia causing sepsis and DKA. Patient admitted to family medicine stepdown service for further management. Please see their notes for further care of this patient in the hospital.   Final Clinical Impressions(s) / ED Diagnoses   Final diagnoses:  Altered mental status, unspecified altered mental status type  AKI (acute kidney injury) (HCC)  Hyperglycemia  Community acquired pneumonia, unspecified laterality    Clinical Impression: 1. Altered mental status, unspecified altered mental status type   2. AKI (acute kidney injury) (HCC)   3. Hyperglycemia   4. Community acquired pneumonia, unspecified laterality     Disposition: Admit to Family medicine stepdown unit    Heide Scales, MD 07/10/16 [phone]

## 2016-07-10 NOTE — Progress Notes (Signed)
Pharmacy Antibiotic Note  Ronald FordLouis Casteneda Jr. is a 66 y.o. male admitted on 07/10/2016 with sepsis.  Pharmacy has been consulted for vancomycin and zosyn dosing.  Patient received vancomycin 2g and zosyn 3.375g IV once in the ED.  Plan: Vancomycin 1500mg  IV every 24 hours.  Goal trough 15-20 mcg/mL. Zosyn 3.375g IV q8h (4 hour infusion).  Monitor culture data, renal function and [facility]     No data recorded.  No results for input(s): WBC, CREATININE, LATICACIDVEN, VANCOTROUGH, VANCOPEAK, VANCORANDOM, GENTTROUGH, GENTPEAK, GENTRANDOM, TOBRATROUGH, TOBRAPEAK, TOBRARND, AMIKACINPEAK, AMIKACINTROU, AMIKACIN in the last 168 hours.  CrCl cannot be calculated (Patient's most recent lab result is older than the maximum 21 days allowed.).    Allergies  Allergen Reactions  . Codeine Itching and Other (See Comments)    [name] has this allergy in here but says he's not allergic to anything     Arlean Hoppingorey M. Newman PiesBall, PharmD, BCPS Clinical Pharmacist Pager [phone] 07/10/2016 8:23 AM

## 2016-07-10 NOTE — ED Notes (Signed)
Patient is being transported to xray at this time; visitor waiting in room

## 2016-07-10 NOTE — ED Notes (Signed)
Myself and Whitney, RN changed patient's gown and stretcher linens; placed several chuks underneath patients and a diaper on patient; patient repositioned on stretcher; blanket given

## 2016-07-10 NOTE — H&P (Signed)
Family Medicine Teaching [facility] Admission History and Physical Service Pager: [phone]  Patient name: [name]. Medical record number: [id number] Date of birth: May 20, 1951 Age: 66 y.o. Gender: male  Primary Care Provider: Caryl Ada, DO Consultants: None Code Status: FULL  Chief Complaint: AMS  Assessment and Plan: Jaleel Allen. is a 66 y.o. male presenting with AMS. PMH is significant for Type II DM, Stage III CKD, HTN, HLD, seizure disorder, BPH, COPD, CVA, chronic back pain with sciatica, and chronic gout.   DKA: Type II DM reportedly diet-controlled and not taking any medication. CBG 1141 on arrival with anion gap 17 and ketonuria. Hyponatremic in ED, however corrected sodium WNL at 137. K 6.4. Last A1C 6.7 in 2013. Started on insulin drip in ED. S/p 2L IVF.  - Admit to SDU, Dr. Deirdre Priest attending - 2 additional IVF boluses per protocol plus maintenance fluid at 100cc/hr (slightly less than maintenance given renal failure and concern for overload) - Continue insulin gtt per protocol until CBG <250 - Continuous pulse ox - Telemetry - Hold home glipizide - A1C - BMP q2 x24hrs - NPO except sips with meds while on drip - CBG q1hr  Sepsis: qSOFA score 2. Likely due to PNA. Febrile (101.71F) and tachycardic (HR in 120s) and tachypneic (RR in 30s) in ED. WBC elevated at 17.1, lactate elevated at 3.96. Normotensive to slightly hypertensive. Received one dose vanc/zosyn in ED. UA with no signs of infection.  - Follow blood and urine cultures - Begin CTX and azithro for CAP - Repeat lactic acid   PNA: With new oxygen requirement. Febrile (101.71F) and tachypneic (RR in 30s) in ED with O2 sat in 80s and bilateral lower lobe PNA noted on CXR in ED. O2 sat at quickly improved to high 90s with 2L [place]. History of recent non-productive cough as well. D-dimer elevated at 0.83, however could be 2/2 infection, and hesitant to perform CTA given patient's current AKI. Also Wells  score 1.5, making PE less likely. Given one dose of vanc and zosyn in ED.  - Begin CTX and azithro for CAP - Continue supplemental O2 PRN - Continuous pulse ox  - Consider CTA if suspicion for PE increases  AMS: Worsening over the past week. Likely 2/2 DKA, however infection (PNA) may also be contributing. CT head performed in ED with no acute changes. No history of dementia.  - Neuro checks q2 - Hold trazodone - Decreasing valium  - TSH - UDS - Ethanol level  Acute on chronic renal failure: Cr 3.43. Baseline ~1.7. Likely due to dehydration 2/2 DKA.  - Continue IVF - Holding home Lasix and losartan - Continue to monitor Cr  Hyperkalemia: K 6.4 in ED. No EKG changes. Expect improvement with insulin administration.  - Continue insulin gtt - Monitor with q2 BMP  Elevated troponin: 0.10 in ED. Likely 2/2 demand ischemia as tachycardia to 120s upon arrival. EKG with no evidence of STEMI. Denies chest pain.  - Repeat troponin - Telemetry  HTN: BP 120-152/62-87 in ED. On minoxidil, Coreg, and losartan at home.  - Continue minoxidil, coreg - Hold losartan given AKI - Continue to monitor  HLD:  - Continue home atorvastatin  Diabetic neuropathy:  - Continue home gabapentin 600mg  BID (appropriate dose given patient's GFR)  Seizure disorder: Keppra TID (750mg  AM and lunchtime, 1500mg  qhs) - Continue Keppra - Seizure precautions given decrease in Valium and possibly decreased seizure threshold 2/2 infection  Hx CVA:  - Continue home aspirin, Plavix  COPD: On Advair at home, with albuterol PRN. Not on supplemental O2 at home.  Elwin Sleight- Dulera during admission - Albuterol PRN - Supplemental O2 for now  Chronic gout: On daily allopurinol at home with colchicine PRN.  - Continue home allopurinol  Chronic back pain and sciatica: Taking valium 10mg  TID scheduled at home. Denies taking any other medications for pain. - Decrease valium to 2mg  q8 scheduled given AMS - Seizure  precautions  BPH:  - Continue home Flomax  FEN/GI: NPO except sips with meds while on insulin drip Prophylaxis: Lovenox  Disposition: Admit to SDU  History of Present Illness:  [name]. is a 66 y.o. male presenting with AMS.  History provided by patient's wife as patient is currently altered. Patient's wife reports that for the past 8 days, patient has not been acting like himself. His mental status acutely worsened over the past 24 hours. This morning, his wife found him sitting outside in his car wearing only his boxers and one shoe when the outside temperature was below freezing. She subsequently called EMS.  Wife also reports that patient has been much thirstier than usual in the past few days and has been drinking whatever fluids he can find. She also reports recent non-productive cough.  Patient's wife reports that he has Type II DM but it is diet-controlled. She says he was previously taking glipizide but is now not taking any medications.  Of note, [facility] conditions [facility] He schedules appointments at [facility] only for acute issues.   Review Of Systems: Per HPI with the following additions: Patient denies chest pain, abdominal pain. Endorses SOB.   ROS  Patient Active Problem List   Diagnosis Date Noted  . Sepsis (HCC) 07/10/2016  . Gout 01/11/2016  . Back pain with left-sided sciatica 12/11/2014  . Lower extremity edema 06/03/2013  . Diabetes mellitus with stage 3 chronic kidney disease (HCC) 05/03/2013  . CVA (cerebral infarction) 12/28/2011  . CHRONIC KIDNEY DISEASE STAGE III (MODERATE) 01/26/2009  . OBESITY, MORBID 07/19/2007  . ERECTILE DYSFUNCTION, MILD 12/04/2006  . HEPATITIS C 08/30/2006  . HYPERCHOLESTEROLEMIA 08/30/2006  . Uncontrolled hypertension 08/30/2006  . COPD 08/30/2006  . GASTROESOPHAGEAL REFLUX, NO ESOPHAGITIS 08/30/2006  . CONVULSIONS, SEIZURES, NOS 08/30/2006  . APNEA, SLEEP 08/30/2006    Past Medical  History: Past Medical History:  Diagnosis Date  . Cardiomyopathy (HCC)   . COPD (chronic obstructive pulmonary disease) (HCC)   . Coronary atherosclerosis    mild  . Degenerative disc disease   . Diabetes mellitus   . GERD (gastroesophageal reflux disease)   . Gout    uric acid-9.7  . Headache(784.0)   . Heart murmur   . Hepatitis    hep c  . History of colonoscopy   . HTN (hypertension)   . Hyperlipidemia   . Obesity   . Orthostasis   . Pneumonia   . Recurrent upper respiratory infection (URI)   . S/P lumbar fusion   . Seizures (HCC)   . Shortness of breath   . Sleep apnea    uses cpap  . TIA (transient ischemic attack)     Past Surgical History: Past Surgical History:  Procedure Laterality Date  . 2D echo  12/22/2004  . CARDIAC CATHETERIZATION  04/02/2005  . CPAP  08/14/2005  . LUMBAR DISC SURGERY  07/03/1997   L5-S1; x2  . MRI  10/25/2005   mod to severe biforaminal narrowing L5-S1  . persantine stress test  01/25/2005  . PFT  09/15/2005   mild to moderate obstructive airway disease  . sleep study  08/14/2005   mod OSA/hypopnea    Social History: Social History  Substance Use Topics  . Smoking status: Never Smoker  . Smokeless tobacco: Never Used  . Alcohol use No   Please also refer to relevant sections of EMR.  Family History: Family History  Problem Relation Age of Onset  . Coronary artery disease Mother   . Hypertension Mother   . Hypertension Brother   . Diabetes      Allergies and Medications: Allergies  Allergen Reactions  . Codeine Itching and Other (See Comments)    Pt has this allergy in here but says he's not allergic to anything   No current facility-administered medications on file prior to encounter.    Current Outpatient Prescriptions on File Prior to Encounter  Medication Sig Dispense Refill  . ADVAIR DISKUS 100-50 MCG/DOSE AEPB INHALE 1 PUFF INTO THE LUNGS TWICE DAILY 60 each 0  . albuterol (PROAIR HFA) 108 (90 BASE) MCG/ACT  inhaler Inhale 2 puffs into the lungs every 6 (six) hours as needed for wheezing. 1 Inhaler 0  . allopurinol (ZYLOPRIM) 100 MG tablet Take 4 tablets (400 mg total) by mouth daily. (Patient taking differently: Take 100 mg by mouth daily. ) 120 tablet 2  . atorvastatin (LIPITOR) 80 MG tablet Take 40 mg by mouth at bedtime.    . carvedilol (COREG) 25 MG tablet Take 25 mg by mouth 2 (two) times daily with a meal.     . Cholecalciferol 2000 UNITS TBDP Take 1 capsule by mouth daily.    . furosemide (LASIX) 80 MG tablet 1 tablet twice daily (Patient taking differently: Take 40 mg by mouth daily. ) 60 tablet 11  . glipiZIDE (GLUCOTROL) 5 MG tablet Take 2.5 mg by mouth 2 (two) times daily before a meal.    . Tamsulosin HCl (FLOMAX) 0.4 MG CAPS Take 0.4 mg by mouth daily.    Marland Kitchen albuterol (PROVENTIL,VENTOLIN) 90 MCG/ACT inhaler Inhale 2 puffs into the lungs every 4 (four) hours as needed. 17 g 0  . aspirin 81 MG tablet Take 81 mg by mouth daily.     . Azilsartan Medoxomil (EDARBI) 80 MG TABS Take 1 tablet (80 mg total) by mouth daily. (Patient not taking: Reported on 07/10/2016) 30 tablet 1  . benzonatate (TESSALON) 200 MG capsule Take 1 capsule (200 mg total) by mouth 3 (three) times daily as needed for cough. (Patient not taking: Reported on 07/10/2016) 60 capsule 1  . chlorpheniramine-HYDROcodone (TUSSIONEX PENNKINETIC ER) 10-8 MG/5ML LQCR Take 5 mLs by mouth every 12 (twelve) hours. (Patient not taking: Reported on 07/10/2016) 60 mL 0  . colchicine 0.6 MG tablet Take 1.2 mg (2 tabs) at the first sign of flare, followed in 1 hour with a single dose of 0.6 mg( 1 tab). (Patient not taking: Reported on 07/10/2016) 30 tablet 0  . cyclobenzaprine (FLEXERIL) 5 MG tablet Take 1 tablet (5 mg total) by mouth 3 (three) times daily as needed for muscle spasms. (Patient not taking: Reported on 07/10/2016) 30 tablet 1  . diazepam (VALIUM) 10 MG tablet Take 1 tablet by mouth 3 (three) times daily.    Marland Kitchen diltiazem (CARDIZEM CD) 240 MG  24 hr capsule Take 1 capsule (240 mg total) by mouth daily. (Patient not taking: Reported on 07/10/2016) 30 capsule 11  . ezetimibe (ZETIA) 10 MG tablet Take 1 tablet (10 mg total) by mouth  daily. <PLEASE MAKE APPOINTMENT FOR REFILLS> (Patient not taking: Reported on 07/10/2016) 30 tablet 0  . gabapentin (NEURONTIN) 600 MG tablet Take 600 mg by mouth 2 (two) times daily.    Marland Kitchen levETIRAcetam (KEPPRA) 750 MG tablet Take 750-1,500 mg by mouth 3 (three) times daily. 1 tablet in the morning; 1 tablet at lunch; 2 tablets in the evening    . minoxidil (LONITEN) 2.5 MG tablet Take 5 mg by mouth 2 (two) times daily.    Marland Kitchen omega-3 acid ethyl esters (LOVAZA) 1 G capsule Take 2 g by mouth 2 (two) times daily.     Marland Kitchen oxyCODONE 10 MG TABS Take 1 tablet (10 mg total) by mouth every 6 (six) hours as needed for pain. (Patient not taking: Reported on 07/10/2016) 20 tablet 0  . sildenafil (VIAGRA) 50 MG tablet Take 1 tablet (50 mg total) by mouth daily as needed for erectile dysfunction. (Patient not taking: Reported on 07/10/2016) 10 tablet 2  . tiZANidine (ZANAFLEX) 4 MG tablet Take 1 tablet (4 mg total) by mouth every 8 (eight) hours as needed for muscle spasms. (Patient not taking: Reported on 07/10/2016) 30 tablet 0  . traMADol (ULTRAM) 50 MG tablet Take 1 tablet (50 mg total) by mouth every 6 (six) hours as needed for pain. (Patient not taking: Reported on 07/10/2016) 15 tablet 0  . traMADol (ULTRAM) 50 MG tablet Take 1 tablet (50 mg total) by mouth every 8 (eight) hours as needed. (Patient not taking: Reported on 07/10/2016) 60 tablet 0  . traZODone (DESYREL) 150 MG tablet Take 150 mg by mouth at bedtime.      Objective: BP 136/85 (BP Location: Right Arm)   Pulse 113   Temp 101.2 F (38.4 C) (Oral)   Resp 16   Ht 6\' 2"  (1.88 m)   Wt 300 lb (136.1 kg)   SpO2 99%   BMI 38.52 kg/m  Exam: General: lying in bed in mild distress as IV team attempts to draw blood Eyes: PERRLA, EOMI ENTM: very dry mucous membranes Neck:  supple, no lymphadenopathy Cardiovascular: Tachycardic, systolic murmur appreciated, no LE edema Respiratory: Normal WOB on 2L [place], CTAB though exam limited by patient's habitus and difficulty cooperating with exam, no wheezes appreciated Gastrointestinal: rotund abdomen, +BS, non-distended, soft, non-tender MSK: Moving all extremities spontaneously, unable to perform strength testing Derm: No rashes or bruises noted. Well-healed scars on R arm Neuro: Alert but not oriented to person, place, or time. Unable to follow commands or cooperate with neurological exam.  Psych: Unable to assess as patient not oriented.   Labs and Imaging: CBC BMET   Recent Labs Lab 07/10/16 0820  WBC 17.7*  HGB 16.9  HCT 46.7  PLT 430*    Recent Labs Lab 07/10/16 0820  NA 120*  K 6.4*  CL 85*  CO2 18*  BUN 39*  CREATININE 3.43*  GLUCOSE 1,141*  CALCIUM 10.2     Dg Chest 2 View  Result Date: 07/10/2016 CLINICAL DATA:  Hypoxia, fever, tachycardia and altered mental status. EXAM: CHEST  2 VIEW COMPARISON:  08/11/2014 FINDINGS: Lung volumes are low bilaterally with bilateral lower lung opacities present. Component of bilateral lower lobe pneumonia may be present. It would also be difficult to exclude a component of bilateral pleural effusions. No overt pulmonary edema present. No evidence of pneumothorax. The heart size appears stable. IMPRESSION: Low lung volumes with bilateral lower lung opacities. Component of bilateral lower lobe pneumonia may be present. Electronically Signed   By: Sherrine Maples  Fredia Sorrow M.D.   On: 07/10/2016 09:42   Ct Head Wo Contrast  Result Date: 07/10/2016 CLINICAL DATA:  Blood glucose is over 1,100. Altered mental status. Fever and aphasia. EXAM: CT HEAD WITHOUT CONTRAST TECHNIQUE: Contiguous axial images were obtained from the base of the skull through the vertex without intravenous contrast. COMPARISON:  MR brain 12/30/2011.  CT head 12/28/2011. FINDINGS: The patient was unable to  remain motionless for the exam. Small or subtle lesions could be overlooked. Brain: No evidence for acute cerebral infarction, hemorrhage, mass lesion, hydrocephalus, or extra-axial fluid. Mild atrophy. Hypoattenuation of white matter, consistent with small vessel disease. Asymmetric hypodensity LEFT mid pons, see image 10 series 2 for instance, favored to represent Hounsfield artifact. If there are signs and symptoms suggesting brainstem infarction however, recommend MRI brain for further assessment. Vascular: Vascular calcification in the carotid siphons, basilar artery, and LEFT vertebral artery. Skull: Normal. Negative for fracture or focal lesion. Sinuses/Orbits: No acute finding. Other: None. IMPRESSION: Motion degraded exam demonstrating no definite cerebral infarction or hemorrhage. Mild atrophy and small vessel disease, similar to priors. Asymmetric hypodensity of the LEFT mid pons, favored to represent artifact; if signs and symptoms of brainstem infarction are present, recommend further assessment with MR imaging when the patient is better able to remain motionless. Electronically Signed   By: Elsie Stain M.D.   On: 07/10/2016 10:06    Marquette Saa, MD 07/10/2016, 12:30 PM PGY-2, [facility] FPTS Intern pager: [phone], text pages welcome

## 2016-07-11 ENCOUNTER — Inpatient Hospital Stay (HOSPITAL_COMMUNITY): Payer: Medicare PPO

## 2016-07-11 DIAGNOSIS — J9601 Acute respiratory failure with hypoxia: Secondary | ICD-10-CM

## 2016-07-11 DIAGNOSIS — I639 Cerebral infarction, unspecified: Secondary | ICD-10-CM

## 2016-07-11 DIAGNOSIS — I10 Essential (primary) hypertension: Secondary | ICD-10-CM

## 2016-07-11 DIAGNOSIS — G9340 Encephalopathy, unspecified: Secondary | ICD-10-CM

## 2016-07-11 LAB — POCT I-STAT 3, ART BLOOD GAS (G3+)
ACID-BASE DEFICIT: 7 mmol/L — AB (ref 0.0–2.0)
Acid-base deficit: 7 mmol/L — ABNORMAL HIGH (ref 0.0–2.0)
BICARBONATE: 18.7 mmol/L — AB (ref 20.0–28.0)
Bicarbonate: 19 mmol/L — ABNORMAL LOW (ref 20.0–28.0)
O2 SAT: 98 %
O2 Saturation: 99 %
PH ART: 7.288 — AB (ref 7.350–7.450)
Patient temperature: 102
TCO2: 20 mmol/L (ref 0–100)
TCO2: 20 mmol/L (ref 0–100)
pCO2 arterial: 38.9 mmHg (ref 32.0–48.0)
pCO2 arterial: 40.1 mmHg (ref 32.0–48.0)
pH, Arterial: 7.299 — ABNORMAL LOW (ref 7.350–7.450)
pO2, Arterial: 123 mmHg — ABNORMAL HIGH (ref 83.0–108.0)
pO2, Arterial: 145 mmHg — ABNORMAL HIGH (ref 83.0–108.0)

## 2016-07-11 LAB — TROPONIN I
Troponin I: 0.2 ng/mL (ref <0.03)
Troponin I: 0.2 ng/mL (ref <0.03)

## 2016-07-11 LAB — BASIC METABOLIC PANEL
ANION GAP: 10 (ref 5–15)
Anion gap: 11 (ref 5–15)
BUN: 42 mg/dL — ABNORMAL HIGH (ref 6–20)
BUN: 41 mg/dL — ABNORMAL HIGH (ref 6–20)
CALCIUM: 8.9 mg/dL (ref 8.9–10.3)
CO2: 20 mmol/L — ABNORMAL LOW (ref 22–32)
CO2: 18 mmol/L — ABNORMAL LOW (ref 22–32)
CREATININE: 3.92 mg/dL — AB (ref 0.61–1.24)
Calcium: 9.4 mg/dL (ref 8.9–10.3)
Chloride: 110 mmol/L (ref 101–111)
Chloride: 113 mmol/L — ABNORMAL HIGH (ref 101–111)
Creatinine, Ser: 3.89 mg/dL — ABNORMAL HIGH (ref 0.61–1.24)
GFR calc Af Amer: 17 mL/min — ABNORMAL LOW (ref >60)
GFR calc Af Amer: 17 mL/min — ABNORMAL LOW (ref >60)
GFR, EST NON AFRICAN AMERICAN: 15 mL/min — AB (ref >60)
GFR, EST NON AFRICAN AMERICAN: 15 mL/min — AB (ref >60)
GLUCOSE: 154 mg/dL — AB (ref 65–99)
GLUCOSE: 281 mg/dL — AB (ref 65–99)
Potassium: 5 mmol/L (ref 3.5–5.1)
Potassium: 4.9 mmol/L (ref 3.5–5.1)
SODIUM: 141 mmol/L (ref 135–145)
SODIUM: 141 mmol/L (ref 135–145)

## 2016-07-11 LAB — URINE CULTURE: Culture: <10000 — AB

## 2016-07-11 LAB — BLOOD GAS, ARTERIAL
Acid-base deficit: 6.3 mmol/L — ABNORMAL HIGH (ref 0.0–2.0)
BICARBONATE: 18.7 mmol/L — AB (ref 20.0–28.0)
DRAWN BY: 244851
Delivery systems: POSITIVE
EXPIRATORY PAP: 8
FIO2: 30
Inspiratory PAP: 18
Mode: POSITIVE
O2 Saturation: 97 %
PH ART: 7.329 — AB (ref 7.350–7.450)
PO2 ART: 100 mmHg (ref 83.0–108.0)
Patient temperature: 97.5
RATE: 15 resp/min
pCO2 arterial: 36.3 mmHg (ref 32.0–48.0)

## 2016-07-11 LAB — GLUCOSE, CAPILLARY
GLUCOSE-CAPILLARY: 169 mg/dL — AB (ref 65–99)
GLUCOSE-CAPILLARY: 163 mg/dL — AB (ref 65–99)
GLUCOSE-CAPILLARY: 159 mg/dL — AB (ref 65–99)
GLUCOSE-CAPILLARY: 146 mg/dL — AB (ref 65–99)
GLUCOSE-CAPILLARY: 139 mg/dL — AB (ref 65–99)
GLUCOSE-CAPILLARY: 141 mg/dL — AB (ref 65–99)
GLUCOSE-CAPILLARY: 121 mg/dL — AB (ref 65–99)
GLUCOSE-CAPILLARY: 252 mg/dL — AB (ref 65–99)
Glucose-Capillary: 126 mg/dL — ABNORMAL HIGH (ref 65–99)
Glucose-Capillary: 141 mg/dL — ABNORMAL HIGH (ref 65–99)
Glucose-Capillary: 145 mg/dL — ABNORMAL HIGH (ref 65–99)
Glucose-Capillary: 155 mg/dL — ABNORMAL HIGH (ref 65–99)
Glucose-Capillary: 185 mg/dL — ABNORMAL HIGH (ref 65–99)
Glucose-Capillary: 190 mg/dL — ABNORMAL HIGH (ref 65–99)

## 2016-07-11 LAB — CBC
HEMATOCRIT: 39.8 % (ref 39.0–52.0)
HEMOGLOBIN: 14.5 g/dL (ref 13.0–17.0)
MCH: 25.7 pg — ABNORMAL LOW (ref 26.0–34.0)
MCHC: 36.4 g/dL — ABNORMAL HIGH (ref 30.0–36.0)
MCV: 70.4 fL — AB (ref 78.0–100.0)
PLATELETS: 372 10*3/uL (ref 150–400)
RBC: 5.65 MIL/uL (ref 4.22–5.81)
RDW: 15.8 % — ABNORMAL HIGH (ref 11.5–15.5)
WBC: 27.7 10*3/uL — AB (ref 4.0–10.5)

## 2016-07-11 LAB — MRSA PCR SCREENING: MRSA BY PCR: NEGATIVE

## 2016-07-11 LAB — HEMOGLOBIN A1C
HEMOGLOBIN A1C: 12.3 % — AB (ref 4.8–5.6)
Mean Plasma Glucose: 306 mg/dL

## 2016-07-11 LAB — LACTIC ACID, PLASMA: Lactic Acid, Venous: 1.7 mmol/L (ref 0.5–1.9)

## 2016-07-11 LAB — AMMONIA: AMMONIA: 30 umol/L (ref 9–35)

## 2016-07-11 MED ORDER — MIDAZOLAM HCL 2 MG/2ML IJ SOLN
INTRAMUSCULAR | Status: AC
  Administered 2016-07-12: 2 mg
  Filled 2016-07-11: qty 2

## 2016-07-11 MED ORDER — SODIUM CHLORIDE 0.9 % IV SOLN: INTRAVENOUS | Status: DC

## 2016-07-11 MED ORDER — CHLORHEXIDINE GLUCONATE 0.12 % MT SOLN
15.0000 mL | Freq: Two times a day (BID) | OROMUCOSAL | Status: DC
  Administered 2016-07-11 (×2): 15 mL via OROMUCOSAL

## 2016-07-11 MED ORDER — INSULIN GLARGINE 100 UNIT/ML ~~LOC~~ SOLN: 30.0000 [IU] | Freq: Every day | SUBCUTANEOUS | Status: DC

## 2016-07-11 MED ORDER — INSULIN ASPART 100 UNIT/ML ~~LOC~~ SOLN
0.0000 [IU] | Freq: Three times a day (TID) | SUBCUTANEOUS | Status: DC
  Administered 2016-07-11: 1 [IU] via SUBCUTANEOUS
  Administered 2016-07-11: 2 [IU] via SUBCUTANEOUS

## 2016-07-11 MED ORDER — VANCOMYCIN HCL 10 G IV SOLR
1500.0000 mg | INTRAVENOUS | Status: DC
  Administered 2016-07-11: 1500 mg via INTRAVENOUS
  Filled 2016-07-11: qty 1500

## 2016-07-11 MED ORDER — INSULIN GLARGINE 100 UNIT/ML ~~LOC~~ SOLN
30.0000 [IU] | Freq: Every day | SUBCUTANEOUS | Status: DC
  Administered 2016-07-11: 30 [IU] via SUBCUTANEOUS
  Filled 2016-07-11 (×2): qty 0.3

## 2016-07-11 MED ORDER — PIPERACILLIN-TAZOBACTAM 3.375 G IVPB 30 MIN
3.3750 g | Freq: Once | INTRAVENOUS | Status: AC
  Administered 2016-07-11: 3.375 g via INTRAVENOUS
  Filled 2016-07-11: qty 50

## 2016-07-11 MED ORDER — LORAZEPAM 2 MG/ML IJ SOLN
1.0000 mg | Freq: Once | INTRAMUSCULAR | Status: AC
  Administered 2016-07-11: 1 mg via INTRAVENOUS
  Filled 2016-07-11: qty 1

## 2016-07-11 MED ORDER — PIPERACILLIN-TAZOBACTAM 3.375 G IVPB
3.3750 g | Freq: Three times a day (TID) | INTRAVENOUS | Status: DC
  Administered 2016-07-11 – 2016-07-12 (×4): 3.375 g via INTRAVENOUS
  Filled 2016-07-11 (×5): qty 50

## 2016-07-11 MED ORDER — SODIUM CHLORIDE 0.9 % IV SOLN
INTRAVENOUS | Status: DC
  Administered 2016-07-11 (×3): via INTRAVENOUS

## 2016-07-11 MED ORDER — INSULIN ASPART 100 UNIT/ML ~~LOC~~ SOLN
0.0000 [IU] | Freq: Every day | SUBCUTANEOUS | Status: DC
  Administered 2016-07-11: 3 [IU] via SUBCUTANEOUS

## 2016-07-11 MED ORDER — ORAL CARE MOUTH RINSE
15.0000 mL | Freq: Two times a day (BID) | OROMUCOSAL | Status: DC
  Administered 2016-07-11: 15 mL via OROMUCOSAL

## 2016-07-11 MED ORDER — FENTANYL CITRATE (PF) 100 MCG/2ML IJ SOLN
INTRAMUSCULAR | Status: AC
  Administered 2016-07-12: 100 ug
  Filled 2016-07-11: qty 2

## 2016-07-11 MED ORDER — VANCOMYCIN HCL 10 G IV SOLR: 1500.0000 mg | INTRAVENOUS | Status: DC

## 2016-07-11 MED ORDER — VANCOMYCIN HCL 10 G IV SOLR
1750.0000 mg | INTRAVENOUS | Status: DC
  Administered 2016-07-12: 1750 mg via INTRAVENOUS
  Filled 2016-07-11: qty 1750

## 2016-07-11 MED ORDER — ENOXAPARIN SODIUM 40 MG/0.4ML ~~LOC~~ SOLN
40.0000 mg | SUBCUTANEOUS | Status: DC
  Administered 2016-07-11 – 2016-07-12 (×2): 40 mg via SUBCUTANEOUS
  Filled 2016-07-11 (×2): qty 0.4

## 2016-07-11 MED ORDER — ORAL CARE MOUTH RINSE
15.0000 mL | Freq: Two times a day (BID) | OROMUCOSAL | Status: DC
  Administered 2016-07-11 (×2): 15 mL via OROMUCOSAL

## 2016-07-11 NOTE — Progress Notes (Signed)
Late interval note As the oncoming upper level resident, I went to evaluate the patient after evening sign-out given acuity of patient status.    Patient surrounded by wife and 2 daughters who were initially praying over him. Discussed patient with daytime RN who stated he's been very somnolent throughout the day. She notes he will intermittently open his eyes but is not following a lot of commands. He has soft mittens on as he was pulling at his IV and trying to take off his BiPAP this AM.   His wife notes he opened his L eye initially this afternoon when she came in and touched him on his abdomen, however he is not doing this consistently. She notes he seemed to look at her even squeezed her hand.   Blood pressure 108/83, pulse 76, temperature 97.5 F (36.4 C), temperature source Axillary, resp. rate 17, height 6\' 2"  (1.88 m), weight 271 lb 12.8 oz (123.3 kg), SpO2 100 %. Patient lying in bed on BiPAP with eyes closed, not answering to voice or touch. Soft mittens on.  Patient opens L eye to sternal rub. He looks at me and then his wife. He will not follow commands but keeps that eye open a few minutes. No increased WOB. Poor air movement over the anterior chest wall. satting 100% on BiPAP. RRR, no m/r/g noted.  Abdomen soft, non-tender.  Patient not moving any extremities.  Called to discuss patient attending, Dr. Deirdre Priesthambliss who had evaluated the patient previously. Per attending, patient seemed to be more alert this afternoon, would intermittently open his eye and would grab the attending's hand with his L hand through the mitten. He notes the patient had never moved the R side for him.   Given mental status which seems to have decompensated some throughout this afternoon and the fact that the patient has required soft mittens, I asked CCM to come evaluate patient to determine whether BiPAP vs intubation would be most beneficial for the patient.   Joanna Puffrystal S. Kenzly Rogoff, MD [facility]  Resident

## 2016-07-11 NOTE — Discharge Summary (Signed)
Family Medicine Teaching [facility] Discharge Summary  Patient name: Ronald FordLouis Risden Jr. Medical record number: [id number] Date of birth: [birth year] Age: 66 y.o. Gender: male Date of Admission: 07/10/2016  Date of Discharge: 05/13/17 Admitting Physician: Carney LivingMarshall L Chambliss, MD  Primary Care Provider: Caryl AdaJazma Phelps, DO Consultants: None  Indication for Hospitalization: Altered mental status, hyperglycemia and sepsis   Discharge Diagnoses/Problem List:  - Acute respiratory failure - Acute metabolic encephalopathy - Hyperglycemic Hyperosmolar Syndrome - Diabetes - Sepsis - Altered mental status - Pneumonia - CKD Stage III - Acute kidney injury - History of seizures - History of CVA - Diabetic neuropathy - COPD - HTN - Gout - Hyperlipidemia - Chronic back pain with sciatica - BPH  Disposition: Hospice  Discharge Condition: Stable  Discharge Exam:  General: comfortable on morphine  Neuro: obtunded, does not follow commands  ENT: mm moist  Cardiac: RRR Chest: resps even, non labored, noisy breathing, minimal secretions  Abd: soft, non tender Ext: edematous Skin: no rashes  Brief Hospital Course:  Ronald Mckeeis a 66 y.o.male withPMH significant for Type II DM, Stage III CKD, HTN, HLD, seizure disorder, BPH, COPD, CVA, chronic back pain with sciatica, and chronic gout presenting with AMS on 07/10/16. On the day of admission he was evaluated in the ED for hyperglycemia in addition to the altered mental status.   Acute metabolic encephalopathy in setting of multiorgan failure s/p acute stroke with progressive respiratory distress Presented with h/o 8 days AMS, R sided weakness and not communicating x2 days.  Found to be in hyperglycemic hyperosmolar nonketotic coma and started on insulin drip, improved so transitioned to subcutaneous insulin. Continued to have AMS and became obtunded, so was intubated and transferred to ICU for his care to be managed by CCM. MRI showed  large posterior left MCA territory infarct with cytotoxic edema and trace petechial hemorrhage so stroke team was consulted. Was found to have micrococcus bacteremia and completed course of rocephin. Patient continued to deteriorate and developed multiorgan failure. Given patient's critical illness CCM had a family meeting regard plan of care and a decision was made for patient to be full comfort care. He was then deemed appropriate for transfer to hospice for full comfort care.  Issues for Follow Up:  1. Restart morphine drip for full comfort care  Significant Procedures: Intubated and was then terminally extubated on 1/19.  Significant Labs and Imaging:   Recent Labs Lab 07/19/16 0540 07/20/16 0500 07/21/16 0425  WBC 11.3* 12.6* 13.4*  HGB 10.0* 10.9* 10.6*  HCT 30.8* 33.8* 33.0*  PLT 288 319 321    Recent Labs Lab 07/18/16 0239  07/19/16 0540  07/19/16 2300 07/20/16 0500 07/20/16 1500 07/20/16 2300 07/21/16 0425  NA 156*  < > 154*  < > 151* 152* 151* 150* 153*  K 3.9  < > 3.8  < > 3.6 3.4* 3.4* 3.7 3.6  CL 127*  < > 125*  < > 118* 118* 120* 118* 118*  CO2 21*  < > 22  < > 24 26 24 23 24   GLUCOSE 207*  < > 265*  < > 323* 318* 235* 173* 143*  BUN 42*  < > 38*  < > 45* 46* 47* 45* 46*  CREATININE 2.24*  < > 2.21*  < > 2.10* 2.34* 2.31* 2.21* 2.25*  CALCIUM 8.7*  < > 8.7*  < > 8.7* 8.7* 8.5* 8.6* 8.7*  MG 2.2  --  2.0  --   --  1.9  --   --  2.0  PHOS 3.3  --  3.1  --   --  3.3  --   --  2.9  < > = values in this interval not displayed. Dg Chest Port 1 View  Result Date: 07/22/2016 CLINICAL DATA:  Respiratory difficulty EXAM: PORTABLE CHEST 1 VIEW COMPARISON:  Yesterday FINDINGS: Endotracheal and NG tubes removed. Left jugular central venous catheter is stable. Bibasilar consolidation is worse. Left pleural effusion is stable. No pneumothorax. IMPRESSION: Extubated. Worsening bibasilar airspace disease. Stable left pleural effusion. Electronically Signed   By: Jolaine Click  M.D.   On: 07/22/2016 08:05   Dg Chest Port 1 View  Result Date: 07/21/2016 CLINICAL DATA:  Hypoxia EXAM: PORTABLE CHEST 1 VIEW COMPARISON:  July 20, 2016 FINDINGS: Endotracheal tube tip is 4.1 cm above the carina. Central catheter tip is in the left innominate vein. Nasogastric tube tip and side port are below the diaphragm. No pneumothorax. There is a left pleural effusion with left base atelectasis. There is somewhat ill-defined patchy opacity in the right base, likely a degree of interstitial edema. Lungs elsewhere clear. Heart is mildly enlarged with pulmonary vascularity within normal limits. No evident adenopathy. No bone lesions. IMPRESSION: Persistent presumed edema right base. Left pleural effusion with left base atelectasis noted. Lungs elsewhere clear. Stable cardiomegaly. Tube and catheter positions as described without evident pneumothorax. Electronically Signed   By: Bretta Bang III M.D.   On: 07/21/2016 07:34    Results/Tests Pending at Time of Discharge: none  Discharge Medications:  Allergies as of 08/23/2016      Reactions   Codeine Itching, Other (See Comments)   Pt has this allergy in here but says he's not allergic to anything      Medication List    STOP taking these medications   albuterol 90 MCG/ACT inhaler Commonly known as:  PROVENTIL,VENTOLIN   allopurinol 100 MG tablet Commonly known as:  ZYLOPRIM   aspirin 81 MG tablet   atorvastatin 80 MG tablet Commonly known as:  LIPITOR   Azilsartan Medoxomil 80 MG Tabs Commonly known as:  EDARBI   benzonatate 200 MG capsule Commonly known as:  TESSALON   carvedilol 25 MG tablet Commonly known as:  COREG   chlorpheniramine-HYDROcodone 10-8 MG/5ML Lqcr Commonly known as:  TUSSIONEX PENNKINETIC ER   cholecalciferol 1000 units tablet Commonly known as:  VITAMIN D   Cholecalciferol 2000 units Tbdp   clopidogrel 75 MG tablet Commonly known as:  PLAVIX   colchicine 0.6 MG tablet   cyclobenzaprine  5 MG tablet Commonly known as:  FLEXERIL   diltiazem 240 MG 24 hr capsule Commonly known as:  CARDIZEM CD   ezetimibe 10 MG tablet Commonly known as:  ZETIA   ferrous sulfate 325 (65 FE) MG tablet   furosemide 80 MG tablet Commonly known as:  LASIX   gabapentin 600 MG tablet Commonly known as:  NEURONTIN   glipiZIDE 5 MG tablet Commonly known as:  GLUCOTROL   latanoprost 0.005 % ophthalmic solution Commonly known as:  XALATAN   levETIRAcetam 750 MG tablet Commonly known as:  KEPPRA   LIPITOR 80 MG tablet Generic drug:  atorvastatin   losartan 50 MG tablet Commonly known as:  COZAAR   minoxidil 10 MG tablet Commonly known as:  LONITEN   minoxidil 2.5 MG tablet Commonly known as:  LONITEN   omega-3 acid ethyl esters 1 g capsule Commonly known as:  LOVAZA   Oxycodone HCl 10 MG Tabs   sildenafil 50 MG tablet  Commonly known as:  VIAGRA   tamsulosin 0.4 MG Caps capsule Commonly known as:  FLOMAX   tiZANidine 4 MG tablet Commonly known as:  ZANAFLEX   traMADol 50 MG tablet Commonly known as:  ULTRAM   traZODone 100 MG tablet Commonly known as:  DESYREL   traZODone 150 MG tablet Commonly known as:  DESYREL     TAKE these medications   ADVAIR DISKUS 100-50 MCG/DOSE Aepb Generic drug:  Fluticasone-Salmeterol INHALE 1 PUFF INTO THE LUNGS TWICE DAILY   albuterol 108 (90 Base) MCG/ACT inhaler Commonly known as:  PROAIR HFA Inhale 2 puffs into the lungs every 6 (six) hours as needed for wheezing.   diazepam 10 MG tablet Commonly known as:  VALIUM Take 1 tablet by mouth 3 (three) times daily.       Discharge Instructions: Please refer to Patient Instructions section of EMR for full details.  Patient was counseled important signs and symptoms that should prompt return to medical care, changes in medications, dietary instructions, activity restrictions, and follow up appointments.   Follow-Up Appointments: Transfer to hospice at [facility]  Leland Her, DO PGY-1, [place] [facility] 08/02/2016 1:19 PM

## 2016-07-11 NOTE — Progress Notes (Signed)
SLP Cancellation Note  Patient Details Name: Ronald FordLouis Beckles Jr. MRN: [id number] DOB: Jun 27, 1951   Cancelled treatment:       Reason Eval/Treat Not Completed: Patient not medically ready. Swallow orders received but pt is currently on BiPAP. Pager number given to RN so we can try to come back if he were to wean from that later today. Will f/u as able.   Maxcine Hamaiewonsky, Deandria Klute 07/11/2016, 10:37 AM  Maxcine HamLaura Paiewonsky, M.A. CCC-SLP [phone]

## 2016-07-11 NOTE — Progress Notes (Signed)
Pharmacy Antibiotic Note  Ronald FordLouis Deloria Jr. is a 66 y.o. male admitted on 07/10/2016 with sepsis.  Pharmacy was consulted for vancomycin and zosyn dosing and antibiotics changed to azithromycin and rocephin. Broad spectrum antibiotics are being resumed (vanc/zosyn). -vancomycin 2000mg  IV given at ~ 2pm today -zosyn given at ~ 9am  Plan: Vancomycin 1500mg  IV every 24 hours.  Goal trough 15-20 mcg/mL. Zosyn 3.375g IV q8h (4 hour infusion).  Monitor culture data, renal function and clinical course VT at SS prn  Height: 6\' 2"  (188 cm) Weight: 271 lb 12.8 oz (123.3 kg) IBW/kg (Calculated) : 82.2  Temp (24hrs), Avg:100 F (37.8 C), Min:98.1 F (36.7 C), Max:102 F (38.9 C)   Recent Labs Lab 07/10/16 0820 07/10/16 0839 07/10/16 1440 07/10/16 1755 07/10/16 2323  WBC 17.7*  --   --   --   --   CREATININE 3.43*  --  3.43* 3.42* 3.58*  LATICACIDVEN  --  3.96* 3.6* 2.9*  --     Estimated Creatinine Clearance: 28.7 mL/min (by C-G formula based on SCr of 3.58 mg/dL (H)).    Allergies  Allergen Reactions  . Codeine Itching and Other (See Comments)    Pt has this allergy in here but says he's not allergic to anything     Arlean Hoppingorey M. Newman PiesBall, PharmD, BCPS Clinical Pharmacist Pager [phone] 07/11/2016 12:36 AM

## 2016-07-11 NOTE — Progress Notes (Signed)
Family Medicine Teaching Service Daily Progress Note Intern Pager: [phone]  Patient name: [name]. Medical record number: [id number] Date of birth: 09-02-1950 Age: 66 y.o. Gender: male  Primary Care Provider: Caryl Ada, DO Consultants: None Code Status: Full  Assessment and Plan: [name]. is a 66 y.o. male presenting with AMS. PMH is significant for Type II DM, Stage III CKD, HTN, HLD, seizure disorder, BPH, COPD, CVA, chronic back pain with sciatica, and chronic gout.   HHS:  status post insulin drip. Transition to subcutaneous insulin this morning. Differential for precipitating factors are infectious (septic and pneumonia), noncompliance (not taking glipizide), CHF exacerbation, acute on chronic kidney failure, stroke (right facial drooping and right upper extremity weakness). - D/C Glucomander - Lantus 30 U daily  - Hold off mealtime coverage as patient is nothing by mouth due to AMS - SSI-thin given AKI/CKD - NS 200 mL/hr for 24 hours - BMP this PM, then daily - Rechecking a CXR given respiratory distress and IV fluids - Will consider echocardiography if worsening chest x-ray   Sepsis likely 2/2 Pneumonia:  had a qSOFA score 2 at admission. Spiked fever 3 times. Fever curve downtrending so far. Hemodynamically stable.  Worsening of leukocytosis to 27.7. Lactic acidosis resolved. ABG with metabolic acidosis. Patient has intermittent desaturation to 70's and 80s while on Huey and CPAP. Per RT, CPAP is not staying and they had to do BiPAP although this is not ideal in pneumonia.  -Continue monitoring respiratory status - Low threshold to call CCM if worsening respiratory status - Follow blood and urine cultures - Continue Vancomycin and Zosyn (1/8>).  - Continue supplemental O2 as needed. - Will discuss using BiPAP in this patient vs other means of delivering oxygen considering his suspected pneumonia. - Consider CTA if suspicion for PE increases.  AMS: He was  awake but confused yesterday. He is obtunded this morning. He was agitated last night and was given Ativan 1 mg IV. UDS and ethanol negative although diazepam is on his medication list. TSH was within normal limits. Initial exam concerning for CVA but unlikely for this to cause AMS. CT head was negative as well although this won't rule out CVA. ABG normal except for acidemia. Doubt encephalitis. Has history of seizure so can't rule out nonconvulsive seizure.  - Neuro checks q2 - Holding home valium and avoiding sedating medications such as benzodiazepines.  - Plan to obtain MRI of the head once able to remain still for study.  - Consider EEG - Futile to check Keppra level since he has been getting this this admission - Continue antibiotics as above - SLP eval  Acute on chronic renal failure: Cr has increased to 3.92, 3.43 on admission. Baseline ~1.7. Differentials are prenatal from dehydration. Doubt obstructive pathology as patient is voiding although he has history of BPH.  - Continue IVF as above. - Holding home Lasix and losartan - Continue to monitor Cr with BMP.   Elevated troponin:  leveled out at 0.2. This is unreliable in the setting of his elevated creatinine to 3.92. EKG shows no evidence of ischemia. BNP within normal limits although this is unreliable in the [facility] and morbidily obese patient - Telemetry monitoring.   HTN: Normotensive at this time. - Continue minoxidil and coreg IV. - Hold losartan given AKI. - Continue to monitor  HLD: Stable. - Continue home atorvastatin.   Diabetic neuropathy:  - Consider adjusting his gabapentin for renal function if her creatinine stays up  Seizure  disorder: Keppra TID (750mg  AM and lunchtime, 1500mg  qhs) - Continue Keppra 1500 IV HS  Hx TI/CVA in 2013:  CT head and carotid Doppler were negative. MRI/MRA with stenosis of the right MCA at M1/M2 junction and 50% stenosis of right ICA.  ASA and Plavix on his medication list -May  consider resuming ASA when his AMS resolved.   COPD: On Advair at home, with albuterol PRN. Not on supplemental O2 at home.  Elwin Sleight during admission - Albuterol PRN - Supplemental O2 for now  Chronic gout:  stable.  -Hold home allopurinol & colchicine while with AMS  Chronic back pain and sciatica: stable -Hold home meds pending SLP   BPH:  - hold home Flomax pending SLP   FEN/GI: NPO until speech can evaluate.   Prophylaxis: Lovenox  Disposition:  Continue SDU pending clinical improvement  Subjective:  Patient is unable to converse during interview this morning. I spoke with the nurse who states that he became combative overnight and was moving both arms per previous nursing reports but has not moved his right side during her shift. Also, he had one incontinent event of urine this morning.   Objective: Temp:  [98.1 F (36.7 C)-102 F (38.9 C)] 100.7 F (38.2 C) (01/09 0807) Pulse Rate:  [91-126] 92 (01/09 0800) Resp:  [16-40] 16 (01/09 0800) BP: (98-157)/(52-114) 128/77 (01/09 0800) SpO2:  [75 %-100 %] 98 % (01/09 0829) Weight:  [123.3 kg (271 lb 12.8 oz)] 123.3 kg (271 lb 12.8 oz) (01/08 2300) Physical Exam: General: Wearing CPAP mask and laying in bed. Responds to some commands and intermittently opens eyes spontaneously, but definitely appears very ill. Cardiovascular: He has a regular rate and rhythm without any appreciable murmurs. He has good, <3 sec, capillary refill. Respiratory: He has 3 L of oxygen being delivering through the CPAP machine and desaturates when laying flat. Lung sounds are hard to determine because the patient does snore but it appears as though crackles can be heard at the bases, more on the left than the right.  Abdomen: Protuberant abdomen that does not elicit any signs of pain with palpation.  Neuro: Not able to follow commands for cranial nerve testing.  Extremities: Able to grip with hand on the left, but not on the right and able to  wiggle toes on the left but not the right. No up-going toes on Babinski testing bilaterally.   Laboratory:  Recent Labs Lab 07/10/16 0820 07/11/16 0425  WBC 17.7* 27.7*  HGB 16.9 14.5  HCT 46.7 39.8  PLT 430* 372    Recent Labs Lab 07/10/16 0820  07/10/16 1755 07/10/16 2323 07/11/16 0425  NA 120*  < > 142 143 141  K 6.4*  < > 4.3 3.9 5.0  CL 85*  < > 110 111 110  CO2 18*  < > 20* 21* 20*  BUN 39*  < > 38* 39* 42*  CREATININE 3.43*  < > 3.42* 3.58* 3.92*  CALCIUM 10.2  < > 10.3 9.8 9.4  PROT 8.6*  --   --   --   --   BILITOT 1.5*  --   --   --   --   ALKPHOS 165*  --   --   --   --   ALT 32  --   --   --   --   AST 49*  --   --   --   --   GLUCOSE 1,141*  < > 181* 115* 154*  < > =  values in this interval not displayed.  Arterial pH 7.299, pCO2 38.9, pO2 145.0 Troponin 0.20 Lactic acid 1.7  John GiovanniJon K Melvin, MS4 07/11/2016, 9:36 AM  [facility] FPTS Intern pager: [phone], text pages welcome

## 2016-07-11 NOTE — Progress Notes (Signed)
Paged family medicine intern about BMET results. Intern and attending at bedside. Will continue to monitor patient.   Horton ChinMacKayla A Napp, RN

## 2016-07-11 NOTE — Consult Note (Signed)
PULMONARY / CRITICAL CARE MEDICINE   Name: [name]. MRN: [id number] DOB: May 05, 1951    ADMISSION DATE:  07/10/2016 CONSULTATION DATE:  07/11/2016  REFERRING MD:  Perrin Maltese Deirdre Priest  CHIEF COMPLAINT:  AMS  HISTORY OF PRESENT ILLNESS:   66 year old male with PMH as below, which is significant for COPD, DM, Hepatitis, HTN, Seizures, OSA, and cardiomyopathy. He was admitted 1/8 by FPTS for HHNK, PNA, and possible CVA after presenting with 8 days AMS and eventually was found sitting outside in the freezing temperature in his underwear. Since time of admission his mentation continued to worsen and he was placed on BiPAP. PCCM consulted for further evaluation.   PAST MEDICAL HISTORY :  He  has a past medical history of Cardiomyopathy (HCC); COPD (chronic obstructive pulmonary disease) (HCC); Coronary atherosclerosis; Degenerative disc disease; Diabetes mellitus; GERD (gastroesophageal reflux disease); Gout; Headache(784.0); Heart murmur; Hepatitis; History of colonoscopy; HTN (hypertension); Hyperlipidemia; Obesity; Orthostasis; Pneumonia; Recurrent upper respiratory infection (URI); S/P lumbar fusion; Seizures (HCC); Shortness of breath; Sleep apnea; and TIA (transient ischemic attack).  PAST SURGICAL HISTORY: He  has a past surgical history that includes 2D echo (12/22/2004); Cardiac catheterization (04/02/2005); CPAP (08/14/2005); Lumbar disc surgery (07/03/1997); MRI (10/25/2005); persantine stress test (01/25/2005); PFT (09/15/2005); and sleep study (08/14/2005).  Allergies  Allergen Reactions  . Codeine Itching and Other (See Comments)    Pt has this allergy in here but says he's not allergic to anything    No current facility-administered medications on file prior to encounter.    Current Outpatient Prescriptions on File Prior to Encounter  Medication Sig  . ADVAIR DISKUS 100-50 MCG/DOSE AEPB INHALE 1 PUFF INTO THE LUNGS TWICE DAILY  . albuterol (PROAIR HFA) 108 (90 BASE) MCG/ACT inhaler  Inhale 2 puffs into the lungs every 6 (six) hours as needed for wheezing.  Marland Kitchen allopurinol (ZYLOPRIM) 100 MG tablet Take 4 tablets (400 mg total) by mouth daily. (Patient taking differently: Take 100 mg by mouth daily. )  . atorvastatin (LIPITOR) 80 MG tablet Take 40 mg by mouth at bedtime.  . carvedilol (COREG) 25 MG tablet Take 25 mg by mouth 2 (two) times daily with a meal.   . Cholecalciferol 2000 UNITS TBDP Take 1 capsule by mouth daily.  . furosemide (LASIX) 80 MG tablet 1 tablet twice daily (Patient taking differently: Take 40 mg by mouth daily. )  . glipiZIDE (GLUCOTROL) 5 MG tablet Take 2.5 mg by mouth 2 (two) times daily before a meal.  . Tamsulosin HCl (FLOMAX) 0.4 MG CAPS Take 0.4 mg by mouth daily.  Marland Kitchen albuterol (PROVENTIL,VENTOLIN) 90 MCG/ACT inhaler Inhale 2 puffs into the lungs every 4 (four) hours as needed.  Marland Kitchen aspirin 81 MG tablet Take 81 mg by mouth daily.   . Azilsartan Medoxomil (EDARBI) 80 MG TABS Take 1 tablet (80 mg total) by mouth daily. (Patient not taking: Reported on 07/10/2016)  . benzonatate (TESSALON) 200 MG capsule Take 1 capsule (200 mg total) by mouth 3 (three) times daily as needed for cough. (Patient not taking: Reported on 07/10/2016)  . chlorpheniramine-HYDROcodone (TUSSIONEX PENNKINETIC ER) 10-8 MG/5ML LQCR Take 5 mLs by mouth every 12 (twelve) hours. (Patient not taking: Reported on 07/10/2016)  . colchicine 0.6 MG tablet Take 1.2 mg (2 tabs) at the first sign of flare, followed in 1 hour with a single dose of 0.6 mg( 1 tab). (Patient not taking: Reported on 07/10/2016)  . cyclobenzaprine (FLEXERIL) 5 MG tablet Take 1 tablet (5 mg total) by  mouth 3 (three) times daily as needed for muscle spasms. (Patient not taking: Reported on 07/10/2016)  . diazepam (VALIUM) 10 MG tablet Take 1 tablet by mouth 3 (three) times daily.  Marland Kitchen diltiazem (CARDIZEM CD) 240 MG 24 hr capsule Take 1 capsule (240 mg total) by mouth daily. (Patient not taking: Reported on 07/10/2016)  . ezetimibe  (ZETIA) 10 MG tablet Take 1 tablet (10 mg total) by mouth daily. <PLEASE MAKE APPOINTMENT FOR REFILLS> (Patient not taking: Reported on 07/10/2016)  . gabapentin (NEURONTIN) 600 MG tablet Take 600 mg by mouth 2 (two) times daily.  Marland Kitchen levETIRAcetam (KEPPRA) 750 MG tablet Take 750-1,500 mg by mouth 3 (three) times daily. 1 tablet in the morning; 1 tablet at lunch; 2 tablets in the evening  . minoxidil (LONITEN) 2.5 MG tablet Take 5 mg by mouth 2 (two) times daily.  Marland Kitchen omega-3 acid ethyl esters (LOVAZA) 1 G capsule Take 2 g by mouth 2 (two) times daily.   Marland Kitchen oxyCODONE 10 MG TABS Take 1 tablet (10 mg total) by mouth every 6 (six) hours as needed for pain. (Patient not taking: Reported on 07/10/2016)  . sildenafil (VIAGRA) 50 MG tablet Take 1 tablet (50 mg total) by mouth daily as needed for erectile dysfunction. (Patient not taking: Reported on 07/10/2016)  . tiZANidine (ZANAFLEX) 4 MG tablet Take 1 tablet (4 mg total) by mouth every 8 (eight) hours as needed for muscle spasms. (Patient not taking: Reported on 07/10/2016)  . traMADol (ULTRAM) 50 MG tablet Take 1 tablet (50 mg total) by mouth every 6 (six) hours as needed for pain. (Patient not taking: Reported on 07/10/2016)  . traMADol (ULTRAM) 50 MG tablet Take 1 tablet (50 mg total) by mouth every 8 (eight) hours as needed. (Patient not taking: Reported on 07/10/2016)  . traZODone (DESYREL) 150 MG tablet Take 150 mg by mouth at bedtime.    FAMILY HISTORY: unknown   SOCIAL HISTORY: He  reports that he has never smoked. He has never used smokeless tobacco. He reports that he does not drink alcohol or use drugs.  REVIEW OF SYSTEMS:     SUBJECTIVE:    VITAL SIGNS: BP 116/71   Pulse 77   Temp 97.5 F (36.4 C) (Axillary)   Resp 14   Ht 6\' 2"  (1.88 m)   Wt 123.3 kg (271 lb 12.8 oz)   SpO2 99%   BMI 34.90 kg/m   HEMODYNAMICS:    VENTILATOR SETTINGS: Vent Mode: BIPAP;PCV FiO2 (%):  [30 %-40 %] 30 % Set Rate:  [14 bmp-15 bmp] 15 bmp  INTAKE /  OUTPUT: I/O last 3 completed shifts: In: 7291.9 [I.V.:2861.9; IV Piggyback:4430] Out: 570 [Urine:570]  PHYSICAL EXAMINATION: General:  Elderly male in NAD on BiPAP Neuro:  Opens eyes to verbal, R facial droop, R sided paralysis HEENT:  [place]/AT, no JVD Cardiovascular:  RRR, no MRG Lungs:  Clear, bilateral breath sounds Abdomen:  Soft, non-tender, non-distended Musculoskeletal:  No acute deformity Skin:  Grossly intact  LABS:  BMET  Recent Labs Lab 07/10/16 2323 07/11/16 0425 07/11/16 1739  NA 143 141 141  K 3.9 5.0 4.9  CL 111 110 113*  CO2 21* 20* 18*  BUN 39* 42* 41*  CREATININE 3.58* 3.92* 3.89*  GLUCOSE 115* 154* 281*    Electrolytes  Recent Labs Lab 07/10/16 2323 07/11/16 0425 07/11/16 1739  CALCIUM 9.8 9.4 8.9    CBC  Recent Labs Lab 07/10/16 0820 07/11/16 0425  WBC 17.7* 27.7*  HGB 16.9 14.5  HCT 46.7 39.8  PLT 430* 372    Coag's  Recent Labs Lab 07/10/16 0854  INR 1.19    Sepsis Markers  Recent Labs Lab 07/10/16 1440 07/10/16 1755 07/11/16 0425  LATICACIDVEN 3.6* 2.9* 1.7    ABG  Recent Labs Lab 07/11/16 0004 07/11/16 1142 07/11/16 2004  PHART 7.299* 7.288* 7.329*  PCO2ART 38.9 40.1 36.3  PO2ART 145.0* 123.0* 100    Liver Enzymes  Recent Labs Lab 07/10/16 0820  AST 49*  ALT 32  ALKPHOS 165*  BILITOT 1.5*  ALBUMIN 4.6    Cardiac Enzymes  Recent Labs Lab 07/10/16 2111 07/11/16 0137 07/11/16 0425  TROPONINI 0.19* 0.20* 0.20*    Glucose  Recent Labs Lab 07/11/16 0927 07/11/16 0957 07/11/16 1117 07/11/16 1231 07/11/16 1611 07/11/16 2151  GLUCAP 146* 139* 141* 121* 185* 252*    Imaging Dg Chest Port 1 View  Result Date: 07/11/2016 CLINICAL DATA:  Acute onset of respiratory failure. Initial encounter. EXAM: PORTABLE CHEST 1 VIEW COMPARISON:  Chest radiograph performed earlier today at 12:49 p.m. FINDINGS: A small left pleural effusion is noted, more prominent than on the prior study. Left basilar  airspace opacity may reflect pneumonia. No pneumothorax is seen. The cardiomediastinal silhouette is mildly enlarged. No acute osseous abnormalities are identified. IMPRESSION: Small left pleural effusion, more prominent than on the prior study. Left basilar airspace opacity may reflect pneumonia. Mild cardiomegaly. Electronically Signed   By: Roanna Raider M.D.   On: 07/11/2016 21:02   Dg Chest Port 1 View  Result Date: 07/11/2016 CLINICAL DATA:  Confusion right side weakness for over a week. EXAM: PORTABLE CHEST 1 VIEW COMPARISON:  PA and lateral chest 07/10/2016 and 08/11/2014. FINDINGS: Streaky left basilar airspace disease is seen. The right lung is clear. Heart size is upper normal. No pneumothorax or pleural effusion. IMPRESSION: Streaky left basilar airspace disease could be due to atelectasis or pneumonia. The right lung is clear. Electronically Signed   By: Drusilla Kanner M.D.   On: 07/11/2016 13:00    STUDIES:  CT head 1/8 > Motion degraded exam demonstrating no definite cerebral infarction or hemorrhage. Mild atrophy and small vessel disease, similar to priors. Asymmetric hypodensity of the LEFT mid pons, favored to represent artifact; if signs and symptoms of brainstem infarction are present, recommend further assessment with MR imaging when the patient is better able to remain motionless.  CULTURES: Blood 1/8 > Urine 1/8 > neg  ANTIBIOTICS: CTX 1/8 Zosyn 1/8 >> Vanco 1/8 >>  SIGNIFICANT EVENTS:   LINES/TUBES:   DISCUSSION:   ASSESSMENT / PLAN:  PULMONARY A: Inability to protect airway secondary to mental status alteration PNA - question aspiration OSA not on CPAP COPD without acute exacerbation  P:   Intubate Full vent support Follow ABG/CXR VAP bundle Scheduled duoneb, PRN albuterol  CARDIOVASCULAR A:  Chronic diastolic CHF HTN  P:  Telemetry monitoring Holding home lisinopril, carvedilol, clopidogrel, and ASA PRN hydralazine  RENAL A:   AKI  on CKD  P:   IVF hydration Follow BMP and UOP  GASTROINTESTINAL A:   GERD  P:   Pepcid daily NPO  HEMATOLOGIC A:   No acute issues   P:  Follow CBC Lovenox for VTE ppx  INFECTIOUS A:   PNA suspected aspiration  P:   Continue abx as above Trend PCT, can likely narrow to Unasyn  ENDOCRINE A:   DM HHNK > resolved    P:   CBG monitoring and SSI D/C lantus as NPO  NEUROLOGIC A:   Pontine CVA  CVA 2013 Seizure disorder  P:   RASS goal: 0 to -1 Neuro consulted Continue home Keppra   FAMILY  - Updates: no family available  - Inter-disciplinary family meet or Palliative Care meeting due by:  1/16   Joneen RoachPaul Hoffman, AGACNP-BC  Pulmonology/Critical Care Pager [phone] or [phone]  07/12/2016 12:07 AM  Attending Note:  66 year old male admitted with AMS and noted to have HONK  During hospitalization mental status deteriorated further and PCCM was consulted for airway protection.  On exam, patient able to follow commands on the left not right with facial droop on the right as well.  I reviewed CT myself, hypotensity noted.  Discussed with neurology, they came and evaluated the patient prior to intubation and agreed with diagnosis of sub-acute stroke.  Spoke with wife, ok to intubate and will give time for neurology to evaluate and work up.  PCCM will continue to follow.  The patient is critically ill with multiple organ systems failure and requires high complexity decision making for assessment and support, frequent evaluation and titration of therapies, application of advanced monitoring technologies and extensive interpretation of multiple databases.   Critical Care Time devoted to patient care services described in this note is  35  Minutes. This time reflects time of care of this signee Dr Koren BoundWesam Yvetta Drotar. This critical care time does not reflect procedure time, or teaching time or supervisory time of PA/NP/Med student/Med Resident etc but could  involve care discussion time.  Alyson ReedyWesam G. Catheline Hixon, M.D. [facility]Critical [facility] Medicine. Pager: [phone]. After hours pager: [phone].

## 2016-07-11 NOTE — Progress Notes (Signed)
Paged MD Diallo about patients increased agitation and combativeness. Orders receivied. Will continue to monitor patient.  Horton ChinMacKayla A Pagett, RN

## 2016-07-11 NOTE — Progress Notes (Signed)
Paged family medicine intern Diallo regarding patient temp 102. No new orders. Will continue to monitor patient.  Horton ChinMacKayla A Hilger, RN

## 2016-07-11 NOTE — Progress Notes (Signed)
Pharmacy Antibiotic Note  Ronald FordLouis Armistead Jr. is a 66 y.o. male admitted on 07/10/2016 with sepsis.  Pharmacy was consulted for vancomycin and zosyn dosing and antibiotics changed to azithromycin and rocephin. Broad spectrum antibiotics were resumed (vanc/zosyn) on 1/8.  Tmax is 102, wbc elevated at 27.7. Patient's renal function has worsened since antibiotics were started. Normalized CrCl is now ~19-20 ml/min.   Plan: - Decrease Vancomycin to 1750 mg IV every 48 hours.  Goal trough 15-20 mcg/mL. - Continue Zosyn 3.375g IV q8h (4 hour infusion).  - Monitor culture data, renal function and clinical course - VT at SS prn  Height: 6\' 2"  (188 cm) Weight: 271 lb 12.8 oz (123.3 kg) IBW/kg (Calculated) : 82.2  Temp (24hrs), Avg:99.9 F (37.7 C), Min:98.6 F (37 C), Max:102 F (38.9 C)   Recent Labs Lab 07/10/16 0820 07/10/16 0839 07/10/16 1440 07/10/16 1755 07/10/16 2323 07/11/16 0425  WBC 17.7*  --   --   --   --  27.7*  CREATININE 3.43*  --  3.43* 3.42* 3.58* 3.92*  LATICACIDVEN  --  3.96* 3.6* 2.9*  --  1.7    Estimated Creatinine Clearance: 26.2 mL/min (by C-G formula based on SCr of 3.92 mg/dL (H)).    Allergies  Allergen Reactions  . Codeine Itching and Other (See Comments)    Pt has this allergy in here but says he's not allergic to anything    Allie BossierApryl Katerina Zurn, PharmD PGY1 Pharmacy Resident [phone] (Pager) 07/11/2016 3:29 PM

## 2016-07-11 NOTE — Progress Notes (Signed)
Paged MD Diallo regarding patients BMET per DKA protocol. Notified of other morning lab values as well. Also mentioned that patient has not urinated while being in the SICU. Bladder scan only showing 60-6770ml. If patient has no urinated by 0900 this am then page on call MD. Will continue to monitor patient.  Horton ChinMacKayla A Schreifels, RN

## 2016-07-12 ENCOUNTER — Inpatient Hospital Stay (HOSPITAL_COMMUNITY): Payer: Medicare PPO

## 2016-07-12 ENCOUNTER — Encounter (HOSPITAL_COMMUNITY): Payer: Self-pay | Admitting: *Deleted

## 2016-07-12 DIAGNOSIS — I6312 Cerebral infarction due to embolism of basilar artery: Secondary | ICD-10-CM

## 2016-07-12 DIAGNOSIS — R4182 Altered mental status, unspecified: Secondary | ICD-10-CM

## 2016-07-12 DIAGNOSIS — R739 Hyperglycemia, unspecified: Secondary | ICD-10-CM

## 2016-07-12 DIAGNOSIS — I6330 Cerebral infarction due to thrombosis of unspecified cerebral artery: Secondary | ICD-10-CM | POA: Insufficient documentation

## 2016-07-12 DIAGNOSIS — N179 Acute kidney failure, unspecified: Secondary | ICD-10-CM

## 2016-07-12 LAB — BASIC METABOLIC PANEL
ANION GAP: 7 (ref 5–15)
BUN: 42 mg/dL — ABNORMAL HIGH (ref 6–20)
CALCIUM: 8.9 mg/dL (ref 8.9–10.3)
CHLORIDE: 115 mmol/L — AB (ref 101–111)
CO2: 21 mmol/L — AB (ref 22–32)
Creatinine, Ser: 3.53 mg/dL — ABNORMAL HIGH (ref 0.61–1.24)
GFR calc non Af Amer: 17 mL/min — ABNORMAL LOW (ref >60)
GFR, EST AFRICAN AMERICAN: 19 mL/min — AB (ref >60)
GLUCOSE: 296 mg/dL — AB (ref 65–99)
Potassium: 4.5 mmol/L (ref 3.5–5.1)
Sodium: 143 mmol/L (ref 135–145)

## 2016-07-12 LAB — BLOOD GAS, ARTERIAL
ACID-BASE DEFICIT: 6.3 mmol/L — AB (ref 0.0–2.0)
BICARBONATE: 18.1 mmol/L — AB (ref 20.0–28.0)
Drawn by: 244851
FIO2: 100
LHR: 16 {breaths}/min
O2 Saturation: 98.9 %
PEEP/CPAP: 5 cmH2O
PO2 ART: 196 mmHg — AB (ref 83.0–108.0)
Patient temperature: 98.6
VT: 650 mL
pCO2 arterial: 32.5 mmHg (ref 32.0–48.0)
pH, Arterial: 7.365 (ref 7.350–7.450)

## 2016-07-12 LAB — CBC
HCT: 37.7 % — ABNORMAL LOW (ref 39.0–52.0)
Hemoglobin: 13 g/dL (ref 13.0–17.0)
MCH: 25 pg — AB (ref 26.0–34.0)
MCHC: 34.5 g/dL (ref 30.0–36.0)
MCV: 72.5 fL — AB (ref 78.0–100.0)
Platelets: 276 10*3/uL (ref 150–400)
RBC: 5.2 MIL/uL (ref 4.22–5.81)
RDW: 16.2 % — ABNORMAL HIGH (ref 11.5–15.5)
WBC: 18.7 10*3/uL — ABNORMAL HIGH (ref 4.0–10.5)

## 2016-07-12 LAB — GLUCOSE, CAPILLARY
GLUCOSE-CAPILLARY: 250 mg/dL — AB (ref 65–99)
GLUCOSE-CAPILLARY: 295 mg/dL — AB (ref 65–99)
GLUCOSE-CAPILLARY: 250 mg/dL — AB (ref 65–99)
Glucose-Capillary: 197 mg/dL — ABNORMAL HIGH (ref 65–99)
Glucose-Capillary: 292 mg/dL — ABNORMAL HIGH (ref 65–99)
Glucose-Capillary: 316 mg/dL — ABNORMAL HIGH (ref 65–99)
Glucose-Capillary: 336 mg/dL — ABNORMAL HIGH (ref 65–99)

## 2016-07-12 LAB — PHOSPHORUS
PHOSPHORUS: 3.9 mg/dL (ref 2.5–4.6)
Phosphorus: 3.2 mg/dL (ref 2.5–4.6)

## 2016-07-12 LAB — PROCALCITONIN: Procalcitonin: 0.36 ng/mL

## 2016-07-12 LAB — TRIGLYCERIDES: Triglycerides: 241 mg/dL — ABNORMAL HIGH (ref <150)

## 2016-07-12 LAB — MAGNESIUM
Magnesium: 2.4 mg/dL (ref 1.7–2.4)
Magnesium: 2.4 mg/dL (ref 1.7–2.4)

## 2016-07-12 LAB — AMMONIA: Ammonia: 70 umol/L — ABNORMAL HIGH (ref 9–35)

## 2016-07-12 MED ORDER — CHLORHEXIDINE GLUCONATE 0.12% ORAL RINSE (MEDLINE KIT)
15.0000 mL | Freq: Two times a day (BID) | OROMUCOSAL | Status: DC
  Administered 2016-07-12 – 2016-07-22 (×22): 15 mL via OROMUCOSAL

## 2016-07-12 MED ORDER — FAMOTIDINE IN NACL 20-0.9 MG/50ML-% IV SOLN
20.0000 mg | INTRAVENOUS | Status: DC
  Administered 2016-07-12 – 2016-07-16 (×6): 20 mg via INTRAVENOUS
  Filled 2016-07-12 (×6): qty 50

## 2016-07-12 MED ORDER — INSULIN ASPART 100 UNIT/ML ~~LOC~~ SOLN
8.0000 [IU] | Freq: Once | SUBCUTANEOUS | Status: AC
  Administered 2016-07-12: 8 [IU] via INTRAVENOUS

## 2016-07-12 MED ORDER — FENTANYL BOLUS VIA INFUSION
25.0000 ug | INTRAVENOUS | Status: DC | PRN
  Administered 2016-07-12 – 2016-07-21 (×21): 25 ug via INTRAVENOUS
  Filled 2016-07-12: qty 25

## 2016-07-12 MED ORDER — INSULIN ASPART 100 UNIT/ML ~~LOC~~ SOLN
0.0000 [IU] | SUBCUTANEOUS | Status: DC
  Administered 2016-07-12: 11 [IU] via SUBCUTANEOUS
  Administered 2016-07-13 (×2): 8 [IU] via SUBCUTANEOUS
  Administered 2016-07-13: 11 [IU] via SUBCUTANEOUS

## 2016-07-12 MED ORDER — VITAL HIGH PROTEIN PO LIQD
1000.0000 mL | ORAL | Status: DC
  Administered 2016-07-12 (×4)
  Administered 2016-07-12: 1000 mL
  Administered 2016-07-13 (×2)
  Administered 2016-07-13: 1000 mL
  Administered 2016-07-13 – 2016-07-14 (×13)
  Administered 2016-07-14: 1000 mL
  Administered 2016-07-14 – 2016-07-15 (×4)
  Administered 2016-07-15 – 2016-07-16 (×2): 1000 mL
  Administered 2016-07-17 (×4)
  Administered 2016-07-17: 1000 mL
  Administered 2016-07-17 (×2)
  Administered 2016-07-18: 1000 mL
  Administered 2016-07-19 (×3)
  Administered 2016-07-19: 1000 mL
  Administered 2016-07-19 – 2016-07-20 (×8)
  Administered 2016-07-20: 1000 mL
  Administered 2016-07-20 – 2016-07-21 (×11)

## 2016-07-12 MED ORDER — ADULT MULTIVITAMIN LIQUID CH
15.0000 mL | Freq: Every day | ORAL | Status: DC
  Administered 2016-07-12 – 2016-07-21 (×10): 15 mL
  Filled 2016-07-12 (×10): qty 15

## 2016-07-12 MED ORDER — SODIUM CHLORIDE 0.9 % IV SOLN
25.0000 ug/h | INTRAVENOUS | Status: DC
  Administered 2016-07-12: 50 ug/h via INTRAVENOUS
  Administered 2016-07-13: 125 ug/h via INTRAVENOUS
  Administered 2016-07-14: 175 ug/h via INTRAVENOUS
  Administered 2016-07-14: 250 ug/h via INTRAVENOUS
  Administered 2016-07-14: 200 ug/h via INTRAVENOUS
  Administered 2016-07-15: 150 ug/h via INTRAVENOUS
  Administered 2016-07-15: 225 ug/h via INTRAVENOUS
  Administered 2016-07-16: 100 ug/h via INTRAVENOUS
  Administered 2016-07-17: 300 ug/h via INTRAVENOUS
  Administered 2016-07-17 – 2016-07-18 (×2): 150 ug/h via INTRAVENOUS
  Administered 2016-07-19: 125 ug/h via INTRAVENOUS
  Administered 2016-07-20: 75 ug/h via INTRAVENOUS
  Administered 2016-07-21: 100 ug/h via INTRAVENOUS
  Filled 2016-07-12 (×15): qty 50

## 2016-07-12 MED ORDER — FENTANYL CITRATE (PF) 100 MCG/2ML IJ SOLN: 50.0000 ug | Freq: Once | INTRAMUSCULAR | Status: DC

## 2016-07-12 MED ORDER — VALPROATE SODIUM 500 MG/5ML IV SOLN
750.0000 mg | Freq: Three times a day (TID) | INTRAVENOUS | Status: DC
  Administered 2016-07-12 – 2016-07-14 (×6): 750 mg via INTRAVENOUS
  Filled 2016-07-12 (×7): qty 7.5

## 2016-07-12 MED ORDER — DEXTROSE 5 % IV SOLN
2.0000 g | Freq: Two times a day (BID) | INTRAVENOUS | Status: DC
  Administered 2016-07-12 – 2016-07-17 (×11): 2 g via INTRAVENOUS
  Filled 2016-07-12 (×11): qty 2

## 2016-07-12 MED ORDER — DEXTROSE 5 % IV SOLN
100.0000 mg | INTRAVENOUS | Status: DC
  Administered 2016-07-12: 100 mg via INTRAVENOUS
  Filled 2016-07-12 (×2): qty 2

## 2016-07-12 MED ORDER — INSULIN GLARGINE 100 UNIT/ML ~~LOC~~ SOLN
5.0000 [IU] | Freq: Two times a day (BID) | SUBCUTANEOUS | Status: DC
  Administered 2016-07-12 – 2016-07-13 (×3): 5 [IU] via SUBCUTANEOUS
  Filled 2016-07-12 (×3): qty 0.05

## 2016-07-12 MED ORDER — ASPIRIN 325 MG PO TABS
325.0000 mg | ORAL_TABLET | Freq: Every day | ORAL | Status: DC
  Administered 2016-07-12 – 2016-07-21 (×10): 325 mg
  Filled 2016-07-12 (×10): qty 1

## 2016-07-12 MED ORDER — FENTANYL CITRATE (PF) 100 MCG/2ML IJ SOLN: 100.0000 ug | Freq: Once | INTRAMUSCULAR | Status: DC

## 2016-07-12 MED ORDER — SODIUM CHLORIDE 0.9 % IV SOLN
INTRAVENOUS | Status: DC
  Administered 2016-07-12 – 2016-07-15 (×4): via INTRAVENOUS

## 2016-07-12 MED ORDER — PRO-STAT SUGAR FREE PO LIQD
60.0000 mL | Freq: Three times a day (TID) | ORAL | Status: DC
  Administered 2016-07-12 – 2016-07-21 (×27): 60 mL
  Filled 2016-07-12 (×27): qty 60

## 2016-07-12 MED ORDER — PROPOFOL 1000 MG/100ML IV EMUL
0.0000 ug/kg/min | INTRAVENOUS | Status: DC
  Administered 2016-07-12: 5 ug/kg/min via INTRAVENOUS
  Filled 2016-07-12 (×2): qty 100

## 2016-07-12 MED ORDER — FENTANYL CITRATE (PF) 100 MCG/2ML IJ SOLN
50.0000 ug | INTRAMUSCULAR | Status: DC | PRN
  Administered 2016-07-12: 50 ug via INTRAVENOUS

## 2016-07-12 MED ORDER — ALBUTEROL SULFATE (2.5 MG/3ML) 0.083% IN NEBU: 2.5000 mg | INHALATION_SOLUTION | RESPIRATORY_TRACT | Status: DC | PRN

## 2016-07-12 MED ORDER — ASPIRIN 300 MG RE SUPP: 300.0000 mg | Freq: Every day | RECTAL | Status: DC

## 2016-07-12 MED ORDER — FENTANYL CITRATE (PF) 100 MCG/2ML IJ SOLN
50.0000 ug | INTRAMUSCULAR | Status: DC | PRN
  Administered 2016-07-12 (×2): 50 ug via INTRAVENOUS
  Filled 2016-07-12 (×3): qty 2

## 2016-07-12 MED ORDER — PRO-STAT SUGAR FREE PO LIQD: 30.0000 mL | Freq: Two times a day (BID) | ORAL | Status: DC

## 2016-07-12 MED ORDER — HYDRALAZINE HCL 20 MG/ML IJ SOLN
10.0000 mg | INTRAMUSCULAR | Status: DC | PRN
  Administered 2016-07-14: 10 mg via INTRAVENOUS
  Filled 2016-07-12: qty 1

## 2016-07-12 MED ORDER — IPRATROPIUM-ALBUTEROL 0.5-2.5 (3) MG/3ML IN SOLN
3.0000 mL | Freq: Four times a day (QID) | RESPIRATORY_TRACT | Status: DC
  Administered 2016-07-12 – 2016-07-21 (×40): 3 mL via RESPIRATORY_TRACT
  Filled 2016-07-12 (×41): qty 3

## 2016-07-12 MED ORDER — INSULIN ASPART 100 UNIT/ML ~~LOC~~ SOLN
2.0000 [IU] | SUBCUTANEOUS | Status: DC
  Administered 2016-07-12: 6 [IU] via SUBCUTANEOUS
  Administered 2016-07-12: 4 [IU] via SUBCUTANEOUS
  Administered 2016-07-12 (×2): 6 [IU] via SUBCUTANEOUS

## 2016-07-12 NOTE — Progress Notes (Addendum)
STROKE TEAM PROGRESS NOTE   HISTORY OF PRESENT ILLNESS (per record) Ronald FordLouis Pannone Jr. is an 66 y.o. male who presented with an 8 day history of AMS on 1/8. He had been found in his car with very little clothing on in temperatures that were significantly below freezing. H&P documents that family endorsed right sided weakness and inability to speak for about 2 days with increasing confusion over the past 24 hours. At baseline he is able to walk and talk normally. Physical exam was significant for right sided weakness in the upper and lower extremities. CT head was obtained and showed no acute bleed, but a possible pontine stroke on the left. This evening (1/9) his ability to protect his airway has become more of a concern and Dr. Molli KnockYacoub was called to intubate, noting on his exam right sided weakness. Neurology was called to further evaluate prior to intubation. During this admission, in addition to right sided weakness, he has been unable to converse and participate in full neurological testing.   The patient has a complex PMHx including DM2, morbid obesity, OSA, COPD, CKD3, HTN, HLD, seizure disorder, DM2, BPH, chronic back pain with sciatica, chronic gout and prior CVA.   Of note, labs were significant for an extremely elevated glucose of 1,141. His Na was 120 and potassium of 6.4. Creatinine was 3.43. Initial lactic acid was 3.96. Ethanol, TSH and urine drug screen were all unremarkable. CXR showed bibasilar atelectasis consistent with a possible pneumonia. Because of the concern for HHS he was started on normal saline boluses as well as an insulin drip with subsequent resolution of the anion gap in his anion gap metabolic acidosis. He was also started on vancomycin and Zosyn for treatment of pneumonia given the concern for sepsis and also of the possibility of aspiration pneumonia given the history of 8 days of worsening altered mental status.   Patient was not considered for IV t-PA / IR secondary to  being outside of treatment window.   SUBJECTIVE (INTERVAL HISTORY) His RN is at the bedside.  She assisted in filling in medical history and treatment gaps. Overall he feels his condition is unstable. She has called CCM to address current high IVF intake, low BP on propofol, HR, glucose, etc.    OBJECTIVE Temp:  [97.5 F (36.4 C)-99.3 F (37.4 C)] 99.3 F (37.4 C) (01/10 0746) Pulse Rate:  [68-96] 74 (01/10 0800) Cardiac Rhythm: Normal sinus rhythm (01/10 0800) Resp:  [8-29] 16 (01/10 0800) BP: (90-147)/(52-129) 102/63 (01/10 0800) SpO2:  [94 %-100 %] 99 % (01/10 0800) FiO2 (%):  [30 %-100 %] 40 % (01/10 0800) Weight:  [126.8 kg (279 lb 8.7 oz)] 126.8 kg (279 lb 8.7 oz) (01/10 0500)  CBC:  Recent Labs Lab 07/10/16 0820 07/11/16 0425 07/12/16 0610  WBC 17.7* 27.7* 18.7*  NEUTROABS 15.1*  --   --   HGB 16.9 14.5 13.0  HCT 46.7 39.8 37.7*  MCV 71.1* 70.4* 72.5*  PLT 430* 372 276    Basic Metabolic Panel:  Recent Labs Lab 07/11/16 1739 07/12/16 0610  NA 141 143  K 4.9 4.5  CL 113* 115*  CO2 18* 21*  GLUCOSE 281* 296*  BUN 41* 42*  CREATININE 3.89* 3.53*  CALCIUM 8.9 8.9    Lipid Panel:    Component Value Date/Time   CHOL 248 (H) 08/12/2013 0933   TRIG 241 (H) 07/12/2016 0212   HDL 41 08/12/2013 0933   CHOLHDL 6.0 08/12/2013 0933   VLDL 24 08/12/2013 0933  LDLCALC 183 (H) 08/12/2013 0933   HgbA1c:  Lab Results  Component Value Date   HGBA1C 12.3 (H) 07/10/2016   Urine Drug Screen:    Component Value Date/Time   LABOPIA NONE DETECTED 07/10/2016 2239   COCAINSCRNUR NONE DETECTED 07/10/2016 2239   LABBENZ NONE DETECTED 07/10/2016 2239   AMPHETMU NONE DETECTED 07/10/2016 2239   THCU NONE DETECTED 07/10/2016 2239   LABBARB NONE DETECTED 07/10/2016 2239      IMAGING  Ct Head Wo Contrast 07/10/2016 Motion degraded exam demonstrating no definite cerebral infarction or hemorrhage. Mild atrophy and small vessel disease, similar to priors. Asymmetric  hypodensity of the LEFT mid pons, favored to represent artifact; if signs and symptoms of brainstem infarction are present, recommend further assessment with MR imaging when the patient is better able to remain motionless.   MRI 07/12/2016 pending   EEG This sedated EEG is abnormal due to moderate low voltage diffuse slowing of the background.   PHYSICAL EXAM Obese middle aged african [place] male who is intubated and sedated   . Afebrile. Head is nontraumatic. Neck is supple without bruit.    Cardiac exam no murmur or gallop. Lungs are clear to auscultation. Distal pulses are well felt. Neurological Exam ;  Sedated on propofol. Intubated. Unresponsive. Left gaze preference but can move the eyes past midline to the right with reflex eye movements. Fundi were not visualized. Pupils 4 mm reactive. Corneal reflexes are present. Tongue is midline. Motor system exam patient withdraws to sternal rub on the left side semipurposeful he. No response in the right side to painful stimuli. Right plantar not elicitable left downgoing. ASSESSMENT/PLAN [name] is a 66 y.o. male with history of Type II DM, Stage III CKD, HTN, HLD, seizure disorder, BPH, COPD, CVA, chronic back pain with sciatica, and chronic gout presenting with 8 day history of AMS, not communicating x 2 days. He did not receive IV t-PA due to delay in arrival.   Stroke:  Likely L mid pontine infarct secondary to small vessel disease  , workup underway  Resultant  Unresponsive state, R hemiparesis, R facial droop  Code Stroke CT L mid pontine infarct  MRI  pending   MRA  Not ordered  Carotid Doppler  ordered  2D Echo  ordered  LDL ordered  HgbA1c pending  Lovenox 40 mg sq daily for VTE prophylaxis  Diet NPO time specified  clopidogrel 75 mg daily prior to admission, now on No antithrombotic. Add aspirin per tube for secondary stroke prevention  Ongoing aggressive stroke risk factor management  Therapy  recommendations:  pending   Disposition:  pending   Acute respiratory Failure  Intubated last night for respiratory failure  Seizure Disorder  Hx seizures  On Keppra PTA  No observed seizure activity  Check EEG - low voltage, slowing, no sz  Given renal failure, d/c Keppra and start depacon 750 q 8  Hypotension Hx Hypertension  Variable with Low BP  Propofol may be contributing to low BP. discontinue  From stroke standpoint, ok forPermissive hypertension (OK if < 220/120) but gradually normalize in 5-7 days  Long-term BP goal normotensive  Hyperlipidemia  Home meds:  lipitor 80  Resume statin when able in hospital  LDL pending, goal < 70  Continue statin at discharge  DKA Diabetes, uncontrolled Diabetic Neuropathy  Glucose levels elevated  HgbA1c pending , goal < 7.0  Uncontrolled  Diabetes RN following  Dysphagia  ? Secondary to stroke  Exacerbated following intubation  Start TF  Other Stroke Risk Factors  Advanced age  Obesity, Body mass index is 35.89 kg/m., recommend weight loss, diet and exercise as appropriate   Hx stroke/TIA  Details not available  Coronary artery disease  Obstructive sleep apnea, on CPAP at home  Other Active Problems  Acute on chronic Renal failure   hypercalcemia  Sepsis  PNA on abx  Elevated troponin  COPD  Chronic gout  Chronic back pain and sciatica  BPH  Hospital day # 2  [facility] See Amion for Pager information 07/12/2016 1:57 PM  I have personally examined this patient, reviewed notes, independently viewed imaging studies, participated in medical decision making and plan of care.ROS completed by me personally and pertinent positives fully documented  I have made any additions or clarifications directly to the above note. Agree with note above. Patient presented with several-day history of right facial and body weakness likely due to left brain subcortical  infarct in setting of hyperosmolar diabetic coma with possible sepsis. Recommend MRI scan of the brain if patient is medically safe to travel. Aspirin for stroke prevention. Check EEG for seizure activity. Change IV Keppra to Depacon for seizures due to renal insufficiency Aggressive hydration and strict control of glucose and sepsis as per pulmonary critical care team. Discussed with RN and Dr. Tyson Alias. This patient is critically ill and at significant risk of neurological worsening, death and care requires constant monitoring of vital signs, hemodynamics,respiratory and cardiac monitoring, extensive review of multiple databases, frequent neurological assessment, discussion with family, other specialists and medical decision making of high complexity.I have made any additions or clarifications directly to the above note.This critical care time does not reflect procedure time, or teaching time or supervisory time of PA/NP/Med Resident etc but could involve care discussion time.  I spent 35 minutes of neurocritical care time  in the care of  this patient.      Delia Heady, MD Medical Director [facility] Pager: [phone] 07/12/2016 3:14 PM  To contact Stroke Continuity provider, please refer to [email or web address]. After hours, contact General Neurology

## 2016-07-12 NOTE — Progress Notes (Signed)
eLink Physician-Brief Progress Note Patient Name: Ronald FordLouis Costilla Jr. DOB: [birth year] MRN: [id number]   Date of Service  07/12/2016  HPI/Events of Note  Blood glucose = 197 >> 316. Started on Lantus BID today.   eICU Interventions  Will order: 1. D/C Q 4 hour standard Novolog SSI. 2. Q 4 hour moderate Novolog SSI.     Intervention Category Major Interventions: Hyperglycemia - active titration of insulin therapy  Sommer,Steven Eugene 07/12/2016, 8:45 PM

## 2016-07-12 NOTE — Progress Notes (Signed)
Initial Nutrition Assessment  DOCUMENTATION CODES:   Obesity unspecified  INTERVENTION:  Initiate TF via OGT with Vital High Protein at goal rate of 40 ml/h (960 ml per day) and Prostat 60 ml TID. TF regimen with current propofol rate will provide 1618 kcals, 174 gm protein, 806 ml free water daily.  Provide liquid multivitamin daily per tube.   NUTRITION DIAGNOSIS:   Inadequate oral intake related to inability to eat as evidenced by NPO status.  GOAL:   Provide needs based on ASPEN/SCCM guidelines  MONITOR:   Vent status, Labs, Weight trends, Skin, I & O's  REASON FOR ASSESSMENT:   Consult, Ventilator Enteral/tube feeding initiation and management  ASSESSMENT:   66 y.o. male who presented with an 8 day history of AMS on 1/8. He had been found in his car with very little clothing on in temperatures that were significantly below freezing.  PMHx including DM2, morbid obesity, OSA, COPD, CKD3, HTN, HLD, seizure disorder, BPH, chronic back pain with sciatica, chronic gout and prior CVA.  CT head was obtained and showed no acute bleed, but a possible pontine stroke on the left.   Patient is currently intubated on ventilator support MV: 11.3 L/min Temp (24hrs), Avg:98.8 F (37.1 C), Min:97.5 F (36.4 C), Max:99.6 F (37.6 C)  Propofol: 2.2 ml/hr which provides 58 kcal/day.   Pt undergoing EEG during attempted time of visit. Unable to complete Nutrition-Focused physical exam at this time. No family at bedside. RD consulted for enteral/tube feeding initiation and management. TF protocol initiated. RD to modify tube feeding orders to meet estimated nutrition needs. Labs and medications reviewed.   Diet Order:  Diet NPO time specified  Skin:  Reviewed, no issues  Last BM:  1/10  Height:   Ht Readings from Last 1 Encounters:  07/10/16 6\' 2"  (1.88 m)    Weight:   Wt Readings from Last 1 Encounters:  07/12/16 279 lb 8.7 oz (126.8 kg)    Ideal Body Weight:  86.36  kg  BMI:  Body mass index is 35.89 kg/m.  Estimated Nutritional Needs:   Kcal:  1395-1775  Protein:  >/= 173 grams  Fluid:  Per MD  EDUCATION NEEDS:   No education needs identified at this time  Roslyn SmilingStephanie Silver Achey, MS, RD, LDN Pager # [phone] After hours/ weekend pager # [phone]

## 2016-07-12 NOTE — Plan of Care (Signed)
Problem: Education: Goal: Knowledge of disease or condition will improve Outcome: Not Progressing PT intubated and sedated Goal: Knowledge of secondary prevention will improve Outcome: Not Progressing See previous  Problem: Nutrition: Goal: Dietary intake will improve Outcome: Progressing Tube feeding started 07/12/2016

## 2016-07-12 NOTE — Progress Notes (Signed)
Inpatient Diabetes Program Recommendations  AACE/ADA: New Consensus Statement on Inpatient Glycemic Control (2015)  Target Ranges:  Prepandial:   less than 140 mg/dL      Peak postprandial:   less than 180 mg/dL (1-2 hours)      Critically ill patients:  140 - 180 mg/dL   Lab Results  Component Value Date   GLUCAP 250 (H) 07/12/2016   HGBA1C 12.3 (H) 07/10/2016    Review of Glycemic ControlResults for Ronald FordBROWN, Ronald JR. (MRN [id number]) as of 07/12/2016 13:59  Ref. Range 07/12/2016 06:01 07/12/2016 08:24 07/12/2016 12:10  Glucose-Capillary Latest Ref Range: 65 - 99 mg/dL 409250 (H) 811295 (H) 914250 (H)   Inpatient Diabetes Program Recommendations:   Note blood sugars elevated.  Patient is on ICU glycemic control order set.  He meets criteria for IV insulin per ICU order set.  If blood sugar > 200 mg/dL, may consider adding IV insulin per protocol.  Thanks, Beryl MeagerJenny Lassie Demorest, RN, BC-ADM Inpatient Diabetes Coordinator Pager [phone] (8a-5p)

## 2016-07-12 NOTE — Progress Notes (Signed)
ANTIBIOTIC CONSULT NOTE - INITIAL  Pharmacy Consult for Acyclovir Indication: r/o meningitis  Allergies  Allergen Reactions  . Codeine Itching and Other (See Comments)    Pt has this allergy in here but says he's not allergic to anything    Patient Measurements: Height: 6\' 2"  (188 cm) Weight: 279 lb 8.7 oz (126.8 kg) IBW/kg (Calculated) : 82.2 Adjusted Body Weight: 96 kg   Vital Signs: Temp: 99.3 F (37.4 C) (01/10 0746) Temp Source: Oral (01/10 0746) BP: 105/70 (01/10 1000) Pulse Rate: 73 (01/10 1000) Intake/Output from previous day: 01/09 0701 - 01/10 0700 In: 5700.8 [I.V.:4590.8; NG/GT:30; IV Piggyback:1080] Out: 1140 [Urine:1140] Intake/Output from this shift: Total I/O In: 722.3 [I.V.:664.8; IV Piggyback:57.5] Out: -   Labs:  Recent Labs  07/10/16 0820  07/11/16 0425 07/11/16 1739 07/12/16 0610  WBC 17.7*  --  27.7*  --  18.7*  HGB 16.9  --  14.5  --  13.0  PLT 430*  --  372  --  276  CREATININE 3.43*  < > 3.92* 3.89* 3.53*  < > = values in this interval not displayed. Estimated Creatinine Clearance: 29.5 mL/min (by C-G formula based on SCr of 3.53 mg/dL (H)). No results for input(s): VANCOTROUGH, VANCOPEAK, VANCORANDOM, GENTTROUGH, GENTPEAK, GENTRANDOM, TOBRATROUGH, TOBRAPEAK, TOBRARND, AMIKACINPEAK, AMIKACINTROU, AMIKACIN in the last 72 hours.   Microbiology:   Medical History: Past Medical History:  Diagnosis Date  . Cardiomyopathy (HCC)   . COPD (chronic obstructive pulmonary disease) (HCC)   . Coronary atherosclerosis    mild  . Degenerative disc disease   . Diabetes mellitus   . GERD (gastroesophageal reflux disease)   . Gout    uric acid-9.7  . Headache(784.0)   . Heart murmur   . Hepatitis    hep c  . History of colonoscopy   . HTN (hypertension)   . Hyperlipidemia   . Obesity   . Orthostasis   . Pneumonia   . Recurrent upper respiratory infection (URI)   . S/P lumbar fusion   . Seizures (HCC)   . Shortness of breath   . Sleep  apnea    uses cpap  . TIA (transient ischemic attack)     Assessment: 66 y.o. male admitted on 07/10/2016 with sepsis and AMS x 8 days. Found to be in DKA and have PNA.  New CVA - Pt febrile, lactic acid 3.96, WBC 27.7, Scr 3.42, Plt 430, elevated glucose in the 1141, Na 120, K 6.4, Etoh/UDS negative.  D: Sepsis with possible PNA with aspiration, r/o meningitis. Currently, tmax 100.7, Tc 99.3, lactic acid has improved to 1.7 (wnl), WBC 27.7>18.7, SCr 3.53 slightly down, PC 0.36 ok  Antimicrobials this admission:  Vanc 1/8 >>  Zosyn 1/8 >> 1/10 Azith 1/8 >> ordered, but not given CTX 1/8 >> 1/8, 1/10>>  Dose adjustments this admission:  - Vanc reduced to 1750 mg IV Q48 due to reduced CrCl  Microbiology results:  1/08 BCx: NG (day 1) 1 /08 UCx: Insig growth 1/08 MRSA PCR: negative   Goal of Therapy:  Eradication of infection  Plan:  - Vanco 1750mg  IV q48h - Rocephin 2g IV q12h - Acyclovir 100mg  (used adjBW) q24h IV    Iosefa Weintraub S. Merilynn Finlandobertson, PharmD, BCPS Clinical Staff Pharmacist Pager [phone]  Misty Stanleyobertson, Lovett Coffin Stillinger 07/12/2016,11:31 AM

## 2016-07-12 NOTE — Plan of Care (Signed)
Problem: Education: Goal: Knowledge of [facility] Education information/materials will improve Outcome: Not Progressing Pt w/ AMS  Problem: Health Behavior/Discharge Planning: Goal: Ability to manage health-related needs will improve Outcome: Not Progressing Pt w/ AMS  Problem: Pain Managment: Goal: General experience of comfort will improve Outcome: Not Progressing UTA  Problem: Physical Regulation: Goal: Will remain free from infection Outcome: Not Progressing WBC remains elevated  Problem: Skin Integrity: Goal: Risk for impaired skin integrity will decrease Outcome: Progressing Turning q2hr  Problem: Activity: Goal: Risk for activity intolerance will decrease Outcome: Not Progressing Pt intubated  Problem: Fluid Volume: Goal: Ability to maintain a balanced intake and output will improve Outcome: Not Progressing PT NPO  Problem: Nutrition: Goal: Adequate nutrition will be maintained Outcome: Not Progressing Pt NPO  Problem: Spiritual Needs Goal: Ability to function at adequate level Outcome: Not Progressing AMS

## 2016-07-12 NOTE — Procedures (Signed)
Central Venous Catheter Insertion Procedure Note Cristal FordLouis Ekman Jr. [id number] [birth year]  Procedure: Insertion of Central Venous Catheter Indications: Assessment of intravascular volume, Drug and/or fluid administration and Frequent blood sampling  Procedure Details Consent: Unable to obtain consent because of altered level of consciousness. Time Out: Verified patient identification, verified procedure, site/side was marked, verified correct patient position, special equipment/implants available, medications/allergies/relevent history reviewed, required imaging and test results available.  Performed  Maximum sterile technique was used including antiseptics, cap, gloves, gown, hand hygiene, mask and sheet. Skin prep: Chlorhexidine; local anesthetic administered A antimicrobial bonded/coated triple lumen catheter was placed in the left internal jugular vein using the Seldinger technique.  Evaluation Blood flow good Complications: No apparent complications Patient did tolerate procedure well. Chest X-ray ordered to verify placement.  CXR: pending.  Jovita KussmaulKatalina Eubanks, AG-ACNP [facility]  Pgr: [phone]  PCCM Pgr: [phone]  [place]  [place]  Mcarthur Rossettianiel J. Tyson AliasFeinstein, MD, FACP Pgr: [phone] [facility]

## 2016-07-12 NOTE — Progress Notes (Signed)
SLP Cancellation Note  Patient Details Name: Ronald FordLouis Due Jr. MRN: [id number] DOB: 16-May-1951   Cancelled treatment:       Reason Eval/Treat Not Completed: Patient not medically ready. Pt intubated. Given concern for pontine infarct and now intubated, strongly recommend SLP swallow evaluation upon extubation prior to initiation of PO diet. SLP will continue to follow for medical readiness.   [name], [name] 07/12/2016, 9:05 AM  Ronald HamLaura Paiewonsky, M.A. CCC-SLP [phone]

## 2016-07-12 NOTE — Progress Notes (Signed)
EEG Completed; Results Pending  

## 2016-07-12 NOTE — Progress Notes (Signed)
MRI unable to do scan of patient at noon. rescheduled for 1500

## 2016-07-12 NOTE — Progress Notes (Signed)
PULMONARY / CRITICAL CARE MEDICINE   Name: Ronald FordLouis Puls Jr. MRN: [id number] DOB: 1950-11-09    ADMISSION DATE:  07/10/2016 CONSULTATION DATE:  07/11/2016  REFERRING MD:  Holland FallingFPTS - [place]  CHIEF COMPLAINT:  AMS  Brief:   66 year old male with PMH as below, which is significant for COPD, DM, Hepatitis, HTN, Seizures, OSA, and cardiomyopathy. He was admitted 1/8 by FPTS for HHNK, PNA, and possible CVA after presenting with 8 days AMS and eventually was found sitting outside in the freezing temperature in his underwear. Since time of admission his mentation continued to worsen and he was placed on BiPAP.   SUBJECTIVE:  Remains intubated, on propofol.   VITAL SIGNS: BP 102/63 (BP Location: Right Arm)   Pulse 74   Temp 99.3 F (37.4 C) (Oral)   Resp 16   Ht 6\' 2"  (1.88 m)   Wt 126.8 kg (279 lb 8.7 oz)   SpO2 99%   BMI 35.89 kg/m   HEMODYNAMICS:    VENTILATOR SETTINGS: Vent Mode: PRVC FiO2 (%):  [30 %-100 %] 40 % Set Rate:  [15 bmp-16 bmp] 16 bmp Vt Set:  [650 mL] 650 mL PEEP:  [5 cmH20] 5 cmH20 Plateau Pressure:  [20 cmH20-23 cmH20] 20 cmH20  INTAKE / OUTPUT: I/O last 3 completed shifts: In: 6181.7 [I.V.:5121.7; NG/GT:30; IV Piggyback:1030] Out: 1140 [Urine:1140]  PHYSICAL EXAMINATION: General:  Elderly male, lying in bed  Neuro: Sedated, moves extremities, non-purposeful, not following commands  HEENT:  Normocephalic, ETT in place  Cardiovascular:  RRR, no MRG, NI S1/S2 Lungs:  Clear, bilateral breath sounds Abdomen:  Soft, non-tender, non-distended Musculoskeletal:  No acute deformity Skin:  Grossly intact  LABS:  BMET  Recent Labs Lab 07/11/16 0425 07/11/16 1739 07/12/16 0610  NA 141 141 143  K 5.0 4.9 4.5  CL 110 113* 115*  CO2 20* 18* 21*  BUN 42* 41* 42*  CREATININE 3.92* 3.89* 3.53*  GLUCOSE 154* 281* 296*    Electrolytes  Recent Labs Lab 07/11/16 0425 07/11/16 1739 07/12/16 0610  CALCIUM 9.4 8.9 8.9    CBC  Recent Labs Lab  07/10/16 0820 07/11/16 0425 07/12/16 0610  WBC 17.7* 27.7* 18.7*  HGB 16.9 14.5 13.0  HCT 46.7 39.8 37.7*  PLT 430* 372 276    Coag's  Recent Labs Lab 07/10/16 0854  INR 1.19    Sepsis Markers  Recent Labs Lab 07/10/16 1440 07/10/16 1755 07/11/16 0425 07/12/16 0212  LATICACIDVEN 3.6* 2.9* 1.7  --   PROCALCITON  --   --   --  0.36    ABG  Recent Labs Lab 07/11/16 1142 07/11/16 2004 07/12/16 0030  PHART 7.288* 7.329* 7.365  PCO2ART 40.1 36.3 32.5  PO2ART 123.0* 100 196*    Liver Enzymes  Recent Labs Lab 07/10/16 0820  AST 49*  ALT 32  ALKPHOS 165*  BILITOT 1.5*  ALBUMIN 4.6    Cardiac Enzymes  Recent Labs Lab 07/10/16 2111 07/11/16 0137 07/11/16 0425  TROPONINI 0.19* 0.20* 0.20*    Glucose  Recent Labs Lab 07/11/16 1231 07/11/16 1611 07/11/16 2151 07/12/16 0417 07/12/16 0601 07/12/16 0824  GLUCAP 121* 185* 252* 292* 250* 295*    Imaging Portable Chest Xray  Result Date: 07/12/2016 CLINICAL DATA:  Intubation EXAM: PORTABLE CHEST 1 VIEW COMPARISON:  Chest radiograph 07/11/2016 FINDINGS: The patient has been intubated. The tip of the endotracheal tube is 2.6 cm above the inferior margin of the carina. Small left pleural effusion is unchanged. Unchanged cardiomegaly. Enteric tube  side port is approximately the level of the gastroesophageal junction. IMPRESSION: 1. Endotracheal tube tip 2.6 cm above the carina. This could be retracted 3.5 cm for optimal positioning. 2. Enteric tube side port of the level of the gastroesophageal junction. Recommend advancement by 5-7 cm. Electronically Signed   By: Deatra Robinson M.D.   On: 07/12/2016 00:41   Dg Chest Port 1 View  Result Date: 07/11/2016 CLINICAL DATA:  Acute onset of respiratory failure. Initial encounter. EXAM: PORTABLE CHEST 1 VIEW COMPARISON:  Chest radiograph performed earlier today at 12:49 p.m. FINDINGS: A small left pleural effusion is noted, more prominent than on the prior study.  Left basilar airspace opacity may reflect pneumonia. No pneumothorax is seen. The cardiomediastinal silhouette is mildly enlarged. No acute osseous abnormalities are identified. IMPRESSION: Small left pleural effusion, more prominent than on the prior study. Left basilar airspace opacity may reflect pneumonia. Mild cardiomegaly. Electronically Signed   By: Roanna Raider M.D.   On: 07/11/2016 21:02   Dg Chest Port 1 View  Result Date: 07/11/2016 CLINICAL DATA:  Confusion right side weakness for over a week. EXAM: PORTABLE CHEST 1 VIEW COMPARISON:  PA and lateral chest 07/10/2016 and 08/11/2014. FINDINGS: Streaky left basilar airspace disease is seen. The right lung is clear. Heart size is upper normal. No pneumothorax or pleural effusion. IMPRESSION: Streaky left basilar airspace disease could be due to atelectasis or pneumonia. The right lung is clear. Electronically Signed   By: Drusilla Kanner M.D.   On: 07/11/2016 13:00    STUDIES:  CT head 1/8 > Motion degraded exam demonstrating no definite cerebral infarction or hemorrhage. Mild atrophy and small vessel disease, similar to priors. Asymmetric hypodensity of the LEFT mid pons, favored to represent artifact; if signs and symptoms of brainstem infarction are present, recommend further assessment with MR imaging when the patient is better able to remain motionless.  CULTURES: Blood 1/8 > Urine 1/8 > neg  ANTIBIOTICS: CTX 1/8 Zosyn 1/8 >1/10 Vanco 1/8 >>  SIGNIFICANT EVENTS: 1/8 > ED for 8 day history of AMS 1/9 > Intubated for inability to protect airway   LINES/TUBES: ETT 1/9 >>   ASSESSMENT / PLAN:  PULMONARY A: Inability to protect airway secondary to mental status alteration PNA - question aspiration OSA not on CPAP COPD without acute exacerbation  P:   Vent Support  Wean as tolerated - mental status barrier to extubation  VAP bundle Scheduled duoneb, PRN albuterol  CARDIOVASCULAR A:  Chronic diastolic CHF HTN  P:   Telemetry monitoring Holding home lisinopril, carvedilol, clopidogrel, and ASA PRN hydralazine  RENAL A:   AKI on CKD (Base Crt 1.9)  P:   IVF hydration > NS @ 50 ml/hr  Follow BMP and UOP  GASTROINTESTINAL A:   GERD P:   Pepcid daily NPO Start TF   HEMATOLOGIC A:   No acute issues  P:  Follow CBC Lovenox for VTE ppx  INFECTIOUS A:   PNA suspected aspiration ?Meningitis  P:   Continue abx as above Trend PCT and Lactic Acid  Fever and WBC curve  Add Rocephin and Acyclovir   ENDOCRINE A:   Hyperglycemia  DM HHNK > resolved P:   CBG monitoring and SSI IV insulin 8 cc push now  Add Lantus 5 BID   NEUROLOGIC A:   Pontine CVA  CVA 2013 Seizure disorder ?Meningitis  P:   RASS goal: 0 to -1 Neuro following  MRI Head today - if negative will need LP  EEG pending  Home Keppra D/C due to Nephrotoxicity - changed to Depakote   FAMILY  - Updates: no family available  - Inter-disciplinary family meet or Palliative Care meeting due by:  1/16  Jovita Kussmaul, AG-ACNP [facility]  Pgr: [phone]  PCCM Pgr: [phone]  STAFF NOTE: Cindi Carbon, MD FACP have personally reviewed patient's available data, including medical history, events of note, physical examination and test results as part of my evaluation. I have discussed with resident/NP and other care providers such as pharmacist, RN and RRT. In addition, I personally evaluated patient and elicited key findings of: eyes open, agitation evident, moving ext purposeful, coarse BS, min edema, history from family supports 8 days of confusion, if MRI neg cva will consider LP and should consider addition empiric acyclovir, asa, neuro following, getting eeg now, borderline BP this am , place line get cvp, dc prop and use other sedation agwent, consider fent drip, int benzo, will re assess ammonia again, may just need empiric lactulose, will assess hep c viral load, need to  determine hep c severity, dose IV reg insulin and re assess glu value, may need drip again, follow renal fxn  With pos balance, after MRi , start TF, add lantus with T Fstart, will allow hyperchloremia / hypernatremia until we r/o cva/brain edema risk on MRI, no family at bedside, alter abx zosyn to ceftriaxone , maintain vanc The patient is critically ill with multiple organ systems failure and requires high complexity decision making for assessment and support, frequent evaluation and titration of therapies, application of advanced monitoring technologies and extensive interpretation of multiple databases.   Critical Care Time devoted to patient care services described in this note is 45 Minutes. This time reflects time of care of this signee: Rory Percy, MD FACP. This critical care time does not reflect procedure time, or teaching time or supervisory time of PA/NP/Med student/Med Resident etc but could involve care discussion time. Rest per NP/medical resident whose note is outlined above and that I agree with   Mcarthur Rossetti. Tyson Alias, MD, FACP Pgr: [phone] [facility] 07/12/2016 11:34 AM

## 2016-07-12 NOTE — Procedures (Signed)
ELECTROENCEPHALOGRAM REPORT  Date of Study: 07/12/2016  Patient's Name: Ronald FordLouis Nosbisch Jr. MRN: [id number] Date of Birth: Jul 01, 1951  Referring Provider: Annie MainSharon Biby, NP  Clinical History: This is a 66 year old man with altered mental status.   Medications: valproate (DEPACON) 750 mg in dextrose 5 % 50 mL IVPB  fentaNYL (SUBLIMAZE) injection 50 mcg  albuterol (PROVENTIL) (2.5 MG/3ML) 0.083% nebulizer solution 2.5 mg  enoxaparin (LOVENOX) injection 40 mg  famotidine (PEPCID) IVPB 20 mg premix  hydrALAZINE (APRESOLINE) injection 10-40 mg  insulin aspart (novoLOG) injection 2-6 Units  ipratropium-albuterol (DUONEB) 0.5-2.5 (3) MG/3ML nebulizer solution 3 mL  latanoprost (XALATAN) 0.005 % ophthalmic solution 1 drop  piperacillin-tazobactam (ZOSYN) IVPB 3.375 g  vancomycin (VANCOCIN) 1,750 mg in sodium chloride 0.9 % 500 mL IVPB   Technical Summary: A multichannel digital EEG recording measured by the international 10-20 system with electrodes applied with paste and impedances below 5000 ohms performed as portable with EKG monitoring in an intubated patient. Propofol turned off an hour prior to EEG, Fentanyl given prior to EEG for agitation. Hyperventilation and photic stimulation were not performed.  The digital EEG was referentially recorded, reformatted, and digitally filtered in a variety of bipolar and referential montages for optimal display.   Description: The patient is intubated and agitated during the recording.  There is no clear posterior dominant rhythm. The background consists of a large amount of diffuse low voltage theta and delta slowing with beta activity seen at times. Normal sleep architecture is not seen. With stimulation, there is an increase in faster frequencies and movement/muscle artifact. Hyperventilation and photic stimulation were not performed.  There were no epileptiform discharges or electrographic seizures seen.    EKG lead was unremarkable.  Impression: This  sedated EEG is abnormal due to moderate low voltage diffuse slowing of the background.  Clinical Correlation of the above findings indicates diffuse cerebral dysfunction that is non-specific in etiology and can be seen with hypoxic/ischemic injury, toxic/metabolic encephalopathies, or medication effect from Fentanyl given prior to EEG.  The absence of epileptiform discharges does not rule out a clinical diagnosis of epilepsy.  Clinical correlation is advised.   Patrcia DollyKaren Romey Mathieson, M.D.

## 2016-07-12 NOTE — Consult Note (Signed)
NEURO HOSPITALIST CONSULT NOTE   Requestig physician: Dr. Erin Hearing  Reason for Consult: Stroke with right hemiplegia, right facial droop and leftward tongue deviation  History obtained from:   Chart    HPI:                                                                                                                                          [name]. is an 66 y.o. male who presented with an 8 day history of AMS on 1/8. He had been found in his car with very little clothing on in temperatures that were significantly below freezing. H and P documents that family endorsed right sided weakness and inability to speak for about 2 days with increasing confusion over the past 24 hours. At baseline he is able to walk and talk normally. Physical exam was significant for right sided weakness in the upper and lower extremities. CT head was obtained and showed no acute bleed, but a possible pontine stroke on the left. This evening (1/9) his ability to protect his airway has become more of a concern and Dr. Nelda Marseille was called to intubate, noting on his exam right sided weakness. Neurology was called to further evaluate prior to intubation. During this admission, in addition to right sided weakness, he has been unable to converse and participate in full neurological testing.   The patient has a complex PMHx including DM2, morbid obesity, OSA, COPD, CKD3, HTN, HLD, seizure disorder, DM2, BPH, chronic back pain with sciatica, chronic gout and prior CVA.   Of note, labs were significant for an extremely elevated glucose of 1,141. His Na was 120 and potassium of 6.4. Creatinine was 3.43. Initial lactic acid was 3.96. Ethanol, TSH and urine drug screen were all unremarkable. CXR showed bibasilar atelectasis consistent with a possible pneumonia. Because of the concern for HHS he was started on normal saline boluses as well as an insulin drip with subsequent resolution of the anion gap in his  anion gap metabolic acidosis. He was also started on vancomycin and Zosyn for treatment of pneumonia given the concern for sepsis and also of the possibility of aspiration pneumonia given the history of 8 days of worsening altered mental status.   Past Medical History:  Diagnosis Date  . Cardiomyopathy ([place])   . COPD (chronic obstructive pulmonary disease) ([place])   . Coronary atherosclerosis    mild  . Degenerative disc disease   . Diabetes mellitus   . GERD (gastroesophageal reflux disease)   . Gout    uric acid-9.7  . Headache(784.0)   . Heart murmur   . Hepatitis    hep c  . History of colonoscopy   . HTN (hypertension)   . Hyperlipidemia   . Obesity   . Orthostasis   .  Pneumonia   . Recurrent upper respiratory infection (URI)   . S/P lumbar fusion   . Seizures ([place])   . Shortness of breath   . Sleep apnea    uses cpap  . TIA (transient ischemic attack)     Past Surgical History:  Procedure Laterality Date  . 2D echo  12/22/2004  . CARDIAC CATHETERIZATION  04/02/2005  . CPAP  08/14/2005  . LUMBAR DISC SURGERY  07/03/1997   L5-S1; x2  . MRI  10/25/2005   mod to severe biforaminal narrowing L5-S1  . persantine stress test  01/25/2005  . PFT  09/15/2005   mild to moderate obstructive airway disease  . sleep study  08/14/2005   mod OSA/hypopnea    Family History  Problem Relation Age of Onset  . Coronary artery disease Mother   . Hypertension Mother   . Hypertension Brother   . Diabetes     Social History:  reports that he has never smoked. He has never used smokeless tobacco. He reports that he does not drink alcohol or use drugs.  Allergies  Allergen Reactions  . Codeine Itching and Other (See Comments)    Pt has this allergy in here but says he's not allergic to anything    MEDICATIONS:                                                                                                                     Scheduled: . chlorhexidine gluconate (MEDLINE KIT)  15 mL  Mouth Rinse BID  . enoxaparin (LOVENOX) injection  40 mg Subcutaneous Q24H  . famotidine (PEPCID) IV  20 mg Intravenous Q24H  . insulin aspart  2-6 Units Subcutaneous Q4H  . ipratropium-albuterol  3 mL Nebulization Q6H  . latanoprost  1 drop Both Eyes QHS  . levETIRAcetam  1,500 mg Intravenous Q12H  . piperacillin-tazobactam (ZOSYN)  IV  3.375 g Intravenous Q8H  . sodium chloride  1,000 mL Intravenous Once   And  . sodium chloride  1,000 mL Intravenous Once  . vancomycin  1,750 mg Intravenous Q48H     ROS:                                                                                                                                       Unable to obtain ROS due to aphasia.    Blood pressure 133/72, pulse 89, temperature 98.8 F (37.1 C),  temperature source Axillary, resp. rate 20, height _0  (1.88 m), weight 123.3 kg (271 lb 12.8 oz), SpO2 94 %.   General Examination:                                                                                                      HEENT-  Normocephalic/atraumatic. Lungs- Pickwickian and sonorous respirations.  Abdomen- Obese Extremities- Warm and well perfused.   Neurological Examination Mental Status: Awake. Severe expressive and receptive aphasia. Able to follow some simple motor commands.  Cranial Nerves: II: Left pupil 2 mm, right 1.5 mm; reactive bilaterally. Fixates briefly on examiner. Attends to stimuli in both visual fields.  III,IV, VI: Unable to gaze fully to left or right, able to cross midline in both directions. Saccadic quality to visual pursuits, which are performed with difficulty.  V,VII: Right facial droop. Reacts to light touch bilaterally.  VIII: Reacts to auditory stimuli.  IX,X: Sonorous respirations XI: Right sided weakness XII: Left ward deviation of tongue. Severe dysarthria.  Motor/Sensory:  LUE and LLE 4+/5. Reacts to sensory stimuli.  RUE: No volitional movement. No response to noxious.  RLE: Withdraws from  noxious at 3/5.  Sensory: Pinprick and light touch intact throughout, bilaterally Deep Tendon Reflexes: No definite asymmetry.  Cerebellar: Unable to follow commands.  Gait: Unable to assess.   Lab Results: Basic Metabolic Panel:  Recent Labs Lab 07/10/16 1440 07/10/16 1755 07/10/16 2323 07/11/16 0425 07/11/16 1739  NA 135 142 143 141 141  K 4.5 4.3 3.9 5.0 4.9  CL 103 110 111 110 113*  CO2 18* 20* 21* 20* 18*  GLUCOSE 524* 181* 115* 154* 281*  BUN 37* 38* 39* 42* 41*  CREATININE 3.43* 3.42* 3.58* 3.92* 3.89*  CALCIUM 10.4* 10.3 9.8 9.4 8.9    Liver Function Tests:  Recent Labs Lab 07/10/16 0820  AST 49*  ALT 32  ALKPHOS 165*  BILITOT 1.5*  PROT 8.6*  ALBUMIN 4.6   No results for input(s): LIPASE, AMYLASE in the last 168 hours.  Recent Labs Lab 07/11/16 1714  AMMONIA 30    CBC:  Recent Labs Lab 07/10/16 0820 07/11/16 0425  WBC 17.7* 27.7*  NEUTROABS 15.1*  --   HGB 16.9 14.5  HCT 46.7 39.8  MCV 71.1* 70.4*  PLT 430* 372    Cardiac Enzymes:  Recent Labs Lab 07/10/16 1440 07/10/16 2111 07/11/16 0137 07/11/16 0425  TROPONINI 0.18* 0.19* 0.20* 0.20*    Lipid Panel: No results for input(s): CHOL, TRIG, HDL, CHOLHDL, VLDL, LDLCALC in the last 168 hours.  CBG:  Recent Labs Lab 07/11/16 0957 07/11/16 1117 07/11/16 1231 07/11/16 1611 07/11/16 2151  GLUCAP 139* 141* 121* 185* 76*    Microbiology: Results for orders placed or performed during the hospital encounter of 07/10/16  Urine culture     Status: Abnormal   Collection Time: 07/10/16  8:15 AM  Result Value Ref Range Status   Specimen Description URINE, RANDOM  Final   Special Requests NONE  Final   Culture <10,000 COLONIES/mL INSIGNIFICANT GROWTH (A)  Final   Report Status  07/11/2016 FINAL  Final  Blood Culture (routine x 2)     Status: None (Preliminary result)   Collection Time: 07/10/16  8:45 AM  Result Value Ref Range Status   Specimen Description BLOOD RIGHT HAND   Final   Special Requests BOTTLES DRAWN AEROBIC ONLY 5CC  Final   Culture NO GROWTH 1 DAY  Final   Report Status PENDING  Incomplete  Blood Culture (routine x 2)     Status: None (Preliminary result)   Collection Time: 07/10/16  8:45 AM  Result Value Ref Range Status   Specimen Description BLOOD RIGHT ANTECUBITAL  Final   Special Requests BOTTLES DRAWN AEROBIC ONLY 5CC  Final   Culture NO GROWTH 1 DAY  Final   Report Status PENDING  Incomplete  MRSA PCR Screening     Status: None   Collection Time: 07/10/16 11:10 PM  Result Value Ref Range Status   MRSA by PCR NEGATIVE NEGATIVE Final    Comment:        The GeneXpert MRSA Assay (FDA approved for NASAL specimens only), is one component of a comprehensive MRSA colonization surveillance program. It is not intended to diagnose MRSA infection nor to guide or monitor treatment for MRSA infections.     Coagulation Studies:  Recent Labs  07/10/16 0854  LABPROT 15.1  INR 1.19    Imaging: Dg Chest 2 View  Result Date: 07/10/2016 CLINICAL DATA:  Hypoxia, fever, tachycardia and altered mental status. EXAM: CHEST  2 VIEW COMPARISON:  08/11/2014 FINDINGS: Lung volumes are low bilaterally with bilateral lower lung opacities present. Component of bilateral lower lobe pneumonia may be present. It would also be difficult to exclude a component of bilateral pleural effusions. No overt pulmonary edema present. No evidence of pneumothorax. The heart size appears stable. IMPRESSION: Low lung volumes with bilateral lower lung opacities. Component of bilateral lower lobe pneumonia may be present. Electronically Signed   By: Aletta Edouard M.D.   On: 07/10/2016 09:42   Ct Head Wo Contrast  Result Date: 07/10/2016 CLINICAL DATA:  Blood glucose is over 1,100. Altered mental status. Fever and aphasia. EXAM: CT HEAD WITHOUT CONTRAST TECHNIQUE: Contiguous axial images were obtained from the base of the skull through the vertex without intravenous  contrast. COMPARISON:  MR brain 12/30/2011.  CT head 12/28/2011. FINDINGS: The patient was unable to remain motionless for the exam. Small or subtle lesions could be overlooked. Brain: No evidence for acute cerebral infarction, hemorrhage, mass lesion, hydrocephalus, or extra-axial fluid. Mild atrophy. Hypoattenuation of white matter, consistent with small vessel disease. Asymmetric hypodensity LEFT mid pons, see image 10 series 2 for instance, favored to represent Hounsfield artifact. If there are signs and symptoms suggesting brainstem infarction however, recommend MRI brain for further assessment. Vascular: Vascular calcification in the carotid siphons, basilar artery, and LEFT vertebral artery. Skull: Normal. Negative for fracture or focal lesion. Sinuses/Orbits: No acute finding. Other: None. IMPRESSION: Motion degraded exam demonstrating no definite cerebral infarction or hemorrhage. Mild atrophy and small vessel disease, similar to priors. Asymmetric hypodensity of the LEFT mid pons, favored to represent artifact; if signs and symptoms of brainstem infarction are present, recommend further assessment with MR imaging when the patient is better able to remain motionless. Electronically Signed   By: Staci Righter M.D.   On: 07/10/2016 10:06   Dg Chest Port 1 View  Result Date: 07/11/2016 CLINICAL DATA:  Acute onset of respiratory failure. Initial encounter. EXAM: PORTABLE CHEST 1 VIEW COMPARISON:  Chest radiograph performed  earlier today at 12:49 p.m. FINDINGS: A small left pleural effusion is noted, more prominent than on the prior study. Left basilar airspace opacity may reflect pneumonia. No pneumothorax is seen. The cardiomediastinal silhouette is mildly enlarged. No acute osseous abnormalities are identified. IMPRESSION: Small left pleural effusion, more prominent than on the prior study. Left basilar airspace opacity may reflect pneumonia. Mild cardiomegaly. Electronically Signed   By: Garald Balding  M.D.   On: 07/11/2016 21:02   Dg Chest Port 1 View  Result Date: 07/11/2016 CLINICAL DATA:  Confusion right side weakness for over a week. EXAM: PORTABLE CHEST 1 VIEW COMPARISON:  PA and lateral chest 07/10/2016 and 08/11/2014. FINDINGS: Streaky left basilar airspace disease is seen. The right lung is clear. Heart size is upper normal. No pneumothorax or pleural effusion. IMPRESSION: Streaky left basilar airspace disease could be due to atelectasis or pneumonia. The right lung is clear. Electronically Signed   By: Inge Rise M.D.   On: 07/11/2016 13:00   Assessment: 1. Aphasia, right hemiplegia and right facial droop. 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 Infarction is > 24 hours old and patient is not an IV tPA or endovascular candidate.  2. AMS. Likely multifactorial. Contributing conditions include HHS, volume depletion and pneumonia. 3. History of seizures.  4. Diabetic neuropathy. 5. HHS.    Recommendations: 1. MRI brain, MRA of head when stable.  2. Carotid ultrasound.  3. TTE.  4. PT/OT/Speech.  5. Continue Keppra. 6. Continue ASA and Plavix. 7. Statin.   8. BP management.   Electronically signed: Dr. Kerney Elbe 07/12/2016, 12:21 AM

## 2016-07-12 NOTE — Procedures (Signed)
OGT Insertion by MD  Under direct laryngoscopy, OGT placed, visualized and bubbles heard.  Alyson ReedyWesam G. Kingdom Vanzanten, M.D. [facility]. Pager: [phone]. After hours pager: [phone].

## 2016-07-12 NOTE — Procedures (Signed)
Intubation Procedure Note [name] [id number] 10/22/1950  Procedure: Intubation Indications: Airway protection and maintenance  Procedure Details Consent: Unable to obtain consent because of emergent medical necessity. Time Out: Verified patient identification, verified procedure, site/side was marked, verified correct patient position, special equipment/implants available, medications/allergies/relevent history reviewed, required imaging and test results available.  Performed  Maximum sterile technique was used including antiseptics, gloves, hand hygiene and mask.  MAC    Evaluation Hemodynamic Status: BP stable throughout; O2 sats: stable throughout Patient's Current Condition: stable Complications: No apparent complications Patient did tolerate procedure well. Chest X-ray ordered to verify placement.  CXR: pending.   YACOUB,WESAM 07/12/2016

## 2016-07-13 ENCOUNTER — Inpatient Hospital Stay (HOSPITAL_COMMUNITY): Payer: Medicare PPO

## 2016-07-13 DIAGNOSIS — R4182 Altered mental status, unspecified: Secondary | ICD-10-CM

## 2016-07-13 DIAGNOSIS — J189 Pneumonia, unspecified organism: Secondary | ICD-10-CM

## 2016-07-13 DIAGNOSIS — G936 Cerebral edema: Secondary | ICD-10-CM

## 2016-07-13 DIAGNOSIS — I63412 Cerebral infarction due to embolism of left middle cerebral artery: Secondary | ICD-10-CM

## 2016-07-13 DIAGNOSIS — I6789 Other cerebrovascular disease: Secondary | ICD-10-CM

## 2016-07-13 LAB — LIPID PANEL
Cholesterol: 110 mg/dL (ref 0–200)
HDL: 16 mg/dL — ABNORMAL LOW (ref >40)
LDL CALC: 64 mg/dL (ref 0–99)
TRIGLYCERIDES: 152 mg/dL — AB (ref <150)
Total CHOL/HDL Ratio: 6.9 RATIO
VLDL: 30 mg/dL (ref 0–40)

## 2016-07-13 LAB — CBC
HCT: 32.4 % — ABNORMAL LOW (ref 39.0–52.0)
Hemoglobin: 11.1 g/dL — ABNORMAL LOW (ref 13.0–17.0)
MCH: 24.9 pg — ABNORMAL LOW (ref 26.0–34.0)
MCHC: 34.3 g/dL (ref 30.0–36.0)
MCV: 72.6 fL — AB (ref 78.0–100.0)
PLATELETS: 216 10*3/uL (ref 150–400)
RBC: 4.46 MIL/uL (ref 4.22–5.81)
RDW: 16.5 % — AB (ref 11.5–15.5)
WBC: 11.8 10*3/uL — AB (ref 4.0–10.5)

## 2016-07-13 LAB — PROCALCITONIN: PROCALCITONIN: 0.46 ng/mL

## 2016-07-13 LAB — GLUCOSE, CAPILLARY
GLUCOSE-CAPILLARY: 264 mg/dL — AB (ref 65–99)
GLUCOSE-CAPILLARY: 249 mg/dL — AB (ref 65–99)
GLUCOSE-CAPILLARY: 224 mg/dL — AB (ref 65–99)
Glucose-Capillary: 206 mg/dL — ABNORMAL HIGH (ref 65–99)
Glucose-Capillary: 261 mg/dL — ABNORMAL HIGH (ref 65–99)
Glucose-Capillary: 322 mg/dL — ABNORMAL HIGH (ref 65–99)

## 2016-07-13 LAB — HCV RNA QUANT
HCV QUANT LOG: 6.957 {Log_IU}/mL (ref >1.70)
HCV Quantitative: 9050000 IU/mL (ref >50)

## 2016-07-13 LAB — BLOOD CULTURE ID PANEL (REFLEXED)
ACINETOBACTER BAUMANNII: NOT DETECTED
CANDIDA KRUSEI: NOT DETECTED
CANDIDA PARAPSILOSIS: NOT DETECTED
Candida albicans: NOT DETECTED
Candida glabrata: NOT DETECTED
Candida tropicalis: NOT DETECTED
ENTEROCOCCUS SPECIES: NOT DETECTED
ESCHERICHIA COLI: NOT DETECTED
Enterobacter cloacae complex: NOT DETECTED
Enterobacteriaceae species: NOT DETECTED
Haemophilus influenzae: NOT DETECTED
KLEBSIELLA OXYTOCA: NOT DETECTED
Klebsiella pneumoniae: NOT DETECTED
LISTERIA MONOCYTOGENES: NOT DETECTED
Neisseria meningitidis: NOT DETECTED
Proteus species: NOT DETECTED
Pseudomonas aeruginosa: NOT DETECTED
SERRATIA MARCESCENS: NOT DETECTED
STAPHYLOCOCCUS AUREUS BCID: NOT DETECTED
STREPTOCOCCUS PYOGENES: NOT DETECTED
Staphylococcus species: NOT DETECTED
Streptococcus agalactiae: NOT DETECTED
Streptococcus pneumoniae: NOT DETECTED
Streptococcus species: NOT DETECTED

## 2016-07-13 LAB — ECHOCARDIOGRAM COMPLETE
HEIGHTINCHES: 74 in
WEIGHTICAEL: 4472.69 [oz_av]

## 2016-07-13 LAB — BASIC METABOLIC PANEL
Anion gap: 8 (ref 5–15)
BUN: 42 mg/dL — AB (ref 6–20)
CALCIUM: 8.5 mg/dL — AB (ref 8.9–10.3)
CO2: 19 mmol/L — ABNORMAL LOW (ref 22–32)
CREATININE: 2.95 mg/dL — AB (ref 0.61–1.24)
Chloride: 120 mmol/L — ABNORMAL HIGH (ref 101–111)
GFR calc Af Amer: 24 mL/min — ABNORMAL LOW (ref >60)
GFR calc non Af Amer: 21 mL/min — ABNORMAL LOW (ref >60)
Glucose, Bld: 286 mg/dL — ABNORMAL HIGH (ref 65–99)
Potassium: 4.1 mmol/L (ref 3.5–5.1)
SODIUM: 147 mmol/L — AB (ref 135–145)

## 2016-07-13 LAB — PHOSPHORUS
PHOSPHORUS: 3.2 mg/dL (ref 2.5–4.6)
PHOSPHORUS: 3.4 mg/dL (ref 2.5–4.6)

## 2016-07-13 LAB — MAGNESIUM
MAGNESIUM: 2.3 mg/dL (ref 1.7–2.4)
MAGNESIUM: 2.2 mg/dL (ref 1.7–2.4)

## 2016-07-13 MED ORDER — ALBUTEROL SULFATE (2.5 MG/3ML) 0.083% IN NEBU: 2.5000 mg | INHALATION_SOLUTION | RESPIRATORY_TRACT | Status: DC | PRN

## 2016-07-13 MED ORDER — INSULIN GLARGINE 100 UNIT/ML ~~LOC~~ SOLN
15.0000 [IU] | Freq: Two times a day (BID) | SUBCUTANEOUS | Status: DC
  Administered 2016-07-13 – 2016-07-18 (×11): 15 [IU] via SUBCUTANEOUS
  Filled 2016-07-13 (×13): qty 0.15

## 2016-07-13 MED ORDER — HEPARIN SODIUM (PORCINE) 5000 UNIT/ML IJ SOLN
5000.0000 [IU] | Freq: Three times a day (TID) | INTRAMUSCULAR | Status: DC
  Administered 2016-07-13 – 2016-07-21 (×24): 5000 [IU] via SUBCUTANEOUS
  Filled 2016-07-13 (×24): qty 1

## 2016-07-13 MED ORDER — SODIUM CHLORIDE 0.9 % IV BOLUS (SEPSIS)
500.0000 mL | Freq: Once | INTRAVENOUS | Status: AC
  Administered 2016-07-13: 500 mL via INTRAVENOUS

## 2016-07-13 MED ORDER — CHLORHEXIDINE GLUCONATE 0.12% ORAL RINSE (MEDLINE KIT): 15.0000 mL | Freq: Two times a day (BID) | OROMUCOSAL | Status: DC

## 2016-07-13 MED ORDER — INSULIN GLARGINE 100 UNIT/ML ~~LOC~~ SOLN
10.0000 [IU] | Freq: Once | SUBCUTANEOUS | Status: AC
  Administered 2016-07-13: 10 [IU] via SUBCUTANEOUS
  Filled 2016-07-13 (×2): qty 0.1

## 2016-07-13 MED ORDER — INSULIN ASPART 100 UNIT/ML ~~LOC~~ SOLN
0.0000 [IU] | SUBCUTANEOUS | Status: DC
  Administered 2016-07-13: 5 [IU] via SUBCUTANEOUS
  Administered 2016-07-13: 11 [IU] via SUBCUTANEOUS
  Administered 2016-07-13 – 2016-07-14 (×3): 5 [IU] via SUBCUTANEOUS
  Administered 2016-07-14: 8 [IU] via SUBCUTANEOUS
  Administered 2016-07-14: 3 [IU] via SUBCUTANEOUS
  Administered 2016-07-14: 5 [IU] via SUBCUTANEOUS
  Administered 2016-07-14: 2 [IU] via SUBCUTANEOUS
  Administered 2016-07-15: 3 [IU] via SUBCUTANEOUS
  Administered 2016-07-15: 11 [IU] via SUBCUTANEOUS
  Administered 2016-07-15: 2 [IU] via SUBCUTANEOUS
  Administered 2016-07-15: 3 [IU] via SUBCUTANEOUS
  Administered 2016-07-15 (×2): 5 [IU] via SUBCUTANEOUS
  Administered 2016-07-15 – 2016-07-16 (×2): 3 [IU] via SUBCUTANEOUS
  Administered 2016-07-16 (×3): 2 [IU] via SUBCUTANEOUS
  Administered 2016-07-16: 11 [IU] via SUBCUTANEOUS
  Administered 2016-07-16 – 2016-07-17 (×2): 3 [IU] via SUBCUTANEOUS
  Administered 2016-07-17: 8 [IU] via SUBCUTANEOUS
  Administered 2016-07-17: 3 [IU] via SUBCUTANEOUS
  Administered 2016-07-17: 2 [IU] via SUBCUTANEOUS
  Administered 2016-07-18 (×2): 5 [IU] via SUBCUTANEOUS
  Administered 2016-07-18: 3 [IU] via SUBCUTANEOUS
  Administered 2016-07-18 (×3): 5 [IU] via SUBCUTANEOUS
  Administered 2016-07-19: 11 [IU] via SUBCUTANEOUS
  Administered 2016-07-19: 8 [IU] via SUBCUTANEOUS
  Administered 2016-07-19: 5 [IU] via SUBCUTANEOUS
  Administered 2016-07-19: 8 [IU] via SUBCUTANEOUS
  Administered 2016-07-19: 5 [IU] via SUBCUTANEOUS
  Administered 2016-07-19 – 2016-07-20 (×2): 8 [IU] via SUBCUTANEOUS
  Administered 2016-07-20: 11 [IU] via SUBCUTANEOUS
  Administered 2016-07-20: 15 [IU] via SUBCUTANEOUS

## 2016-07-13 MED ORDER — ORAL CARE MOUTH RINSE
15.0000 mL | OROMUCOSAL | Status: DC
  Administered 2016-07-13 – 2016-07-22 (×89): 15 mL via OROMUCOSAL

## 2016-07-13 MED ORDER — INSULIN ASPART 100 UNIT/ML ~~LOC~~ SOLN
4.0000 [IU] | SUBCUTANEOUS | Status: DC
  Administered 2016-07-13 – 2016-07-14 (×10): 4 [IU] via SUBCUTANEOUS

## 2016-07-13 NOTE — Progress Notes (Signed)
PHARMACY - PHYSICIAN COMMUNICATION CRITICAL VALUE ALERT - BLOOD CULTURE IDENTIFICATION (BCID)  Results for orders placed or performed during the hospital encounter of 07/10/16  Blood Culture ID Panel (Reflexed) (Collected: 07/10/2016  8:45 AM)  Result Value Ref Range   Enterococcus species NOT DETECTED NOT DETECTED   Listeria monocytogenes NOT DETECTED NOT DETECTED   Staphylococcus species NOT DETECTED NOT DETECTED   Staphylococcus aureus NOT DETECTED NOT DETECTED   Streptococcus species NOT DETECTED NOT DETECTED   Streptococcus agalactiae NOT DETECTED NOT DETECTED   Streptococcus pneumoniae NOT DETECTED NOT DETECTED   Streptococcus pyogenes NOT DETECTED NOT DETECTED   Acinetobacter baumannii NOT DETECTED NOT DETECTED   Enterobacteriaceae species NOT DETECTED NOT DETECTED   Enterobacter cloacae complex NOT DETECTED NOT DETECTED   Escherichia coli NOT DETECTED NOT DETECTED   Klebsiella oxytoca NOT DETECTED NOT DETECTED   Klebsiella pneumoniae NOT DETECTED NOT DETECTED   Proteus species NOT DETECTED NOT DETECTED   Serratia marcescens NOT DETECTED NOT DETECTED   Haemophilus influenzae NOT DETECTED NOT DETECTED   Neisseria meningitidis NOT DETECTED NOT DETECTED   Pseudomonas aeruginosa NOT DETECTED NOT DETECTED   Candida albicans NOT DETECTED NOT DETECTED   Candida glabrata NOT DETECTED NOT DETECTED   Candida krusei NOT DETECTED NOT DETECTED   Candida parapsilosis NOT DETECTED NOT DETECTED   Candida tropicalis NOT DETECTED NOT DETECTED    Name of physician (or Provider) Contacted: Dr. Tyson AliasFeinstein   Changes to prescribed antibiotics required: none, likely contaminant. Follow up other pending cultures and adjust if needed.   Pollyann SamplesAndy Stacy Deshler, PharmD, BCPS 07/13/2016, 9:29 AM Phone [phone] until 3:30 pm, [phone] afterwards

## 2016-07-13 NOTE — Progress Notes (Signed)
Inpatient Diabetes Program Recommendations  AACE/ADA: New Consensus Statement on Inpatient Glycemic Control (2015)  Target Ranges:  Prepandial:   less than 140 mg/dL      Peak postprandial:   less than 180 mg/dL (1-2 hours)      Critically ill patients:  140 - 180 mg/dL   Lab Results  Component Value Date   GLUCAP 261 (H) 07/13/2016   HGBA1C 12.3 (H) 07/10/2016    Review of Glycemic Control: Results for [name], Ronald JR. (MRN [id number]) as of 07/13/2016 10:25  Ref. Range 07/12/2016 16:42 07/12/2016 19:24 07/12/2016 23:58 07/13/2016 03:52 07/13/2016 08:05  Glucose-Capillary Latest Ref Range: 65 - 99 mg/dL 829197 (H) 562316 (H) 130336 (H) 264 (H) 261 (H)   Diabetes history: Diabetes Mellitus Outpatient Diabetes medications: Glipizide 2.5 mg bid  Current orders for Inpatient glycemic control:  Novolog moderate q 4 hours, Novolog 4 units q 4 hours, Lantus 15 units bid  Inpatient Diabetes Program Recommendations:   Note patient not placed on IV insulin over night.  Lantus increased and Novolog meal coverage added this AM.  If CBG's remain > 200 mg/dL, consider IV insulin per ICU glycemic control order set.  Thanks, Beryl MeagerJenny Shontez Sermon, RN, BC-ADM Inpatient Diabetes Coordinator Pager [phone]

## 2016-07-13 NOTE — Progress Notes (Signed)
eLink Physician-Brief Progress Note Patient Name: Ronald FordLouis Karwowski Jr. DOB: [birth year] MRN: [id number]   Date of Service  07/13/2016  HPI/Events of Note  Temp = 102.2 F. Already on Ceftriaxone. Vancomycin D/Ced today. AST = 49 and Creatinine = 2.95. Therefore, will avoid Tylenol and Motrin.   eICU Interventions  Will order: 1. Blood cultures X 2.  2. Cooling blanket.     Intervention Category Major Interventions: Infection - evaluation and management  [place]mer,Steven Eugene 07/13/2016, 7:39 PM

## 2016-07-13 NOTE — Progress Notes (Signed)
STROKE TEAM PROGRESS NOTE   HISTORY OF PRESENT ILLNESS (per record) [name] Ronald Fordr. is an 66 y.o. male who presented with an 8 day history of AMS on 1/8. He had been found in his car with very little clothing on in temperatures that were significantly below freezing. H&P documents that family endorsed right sided weakness and inability to speak for about 2 days with increasing confusion over the past 24 hours. At baseline he is able to walk and talk normally. Physical exam was significant for right sided weakness in the upper and lower extremities. CT head was obtained and showed no acute bleed, but a possible pontine stroke on the left. This evening (1/9) his ability to protect his airway has become more of a concern and Dr. Molli KnockYacoub was called to intubate, noting on his exam right sided weakness. Neurology was called to further evaluate prior to intubation. During this admission, in addition to right sided weakness, he has been unable to converse and participate in full neurological testing.   The patient has a complex PMHx including DM2, morbid obesity, OSA, COPD, CKD3, HTN, HLD, seizure disorder, DM2, BPH, chronic back pain with sciatica, chronic gout and prior CVA.   Of note, labs were significant for an extremely elevated glucose of 1,141. His Na was 120 and potassium of 6.4. Creatinine was 3.43. Initial lactic acid was 3.96. Ethanol, TSH and urine drug screen were all unremarkable. CXR showed bibasilar atelectasis consistent with a possible pneumonia. Because of the concern for HHS he was started on normal saline boluses as well as an insulin drip with subsequent resolution of the anion gap in his anion gap metabolic acidosis. He was also started on vancomycin and Zosyn for treatment of pneumonia given the concern for sepsis and also of the possibility of aspiration pneumonia given the history of 8 days of worsening altered mental status.   Patient was not considered for IV t-PA / IR secondary to  being outside of treatment window.   SUBJECTIVE (INTERVAL HISTORY) His wife is at the bedside.  She assisted in filling in medical history   Overall  his condition is  stable.  MRI shows left posterior division MCA embolic infarct  OBJECTIVE Temp:  [99.1 F (37.3 C)-100.4 F (38 C)] 100.4 F (38 C) (01/11 0808) Pulse Rate:  [66-106] 83 (01/11 0900) Cardiac Rhythm: Normal sinus rhythm (01/11 0742) Resp:  [0-30] 16 (01/11 0900) BP: (96-170)/(41-130) 99/41 (01/11 0900) SpO2:  [96 %-100 %] 100 % (01/11 0900) FiO2 (%):  [40 %] 40 % (01/11 0723) Weight:  [126.8 kg (279 lb 8.7 oz)] 126.8 kg (279 lb 8.7 oz) (01/11 0457)  CBC:  Recent Labs Lab 07/10/16 0820  07/12/16 0610 07/13/16 0400  WBC 17.7*  < > 18.7* 11.8*  NEUTROABS 15.1*  --   --   --   HGB 16.9  < > 13.0 11.1*  HCT 46.7  < > 37.7* 32.4*  MCV 71.1*  < > 72.5* 72.6*  PLT 430*  < > 276 216  < > = values in this interval not displayed.  Basic Metabolic Panel:  Recent Labs Lab 07/12/16 0610  07/12/16 1638 07/13/16 0400  NA 143  --   --  147*  K 4.5  --   --  4.1  CL 115*  --   --  120*  CO2 21*  --   --  19*  GLUCOSE 296*  --   --  286*  BUN 42*  --   --  42*  CREATININE 3.53*  --   --  2.95*  CALCIUM 8.9  --   --  8.5*  MG  --   < > 2.4 2.3  PHOS  --   < > 3.2 3.2  < > = values in this interval not displayed.  Lipid Panel:     Component Value Date/Time   CHOL 110 07/13/2016 0400   TRIG 152 (H) 07/13/2016 0400   HDL 16 (L) 07/13/2016 0400   CHOLHDL 6.9 07/13/2016 0400   VLDL 30 07/13/2016 0400   LDLCALC 64 07/13/2016 0400   HgbA1c:  Lab Results  Component Value Date   HGBA1C 12.3 (H) 07/10/2016   Urine Drug Screen:     Component Value Date/Time   LABOPIA NONE DETECTED 07/10/2016 2239   COCAINSCRNUR NONE DETECTED 07/10/2016 2239   LABBENZ NONE DETECTED 07/10/2016 2239   AMPHETMU NONE DETECTED 07/10/2016 2239   THCU NONE DETECTED 07/10/2016 2239   LABBARB NONE DETECTED 07/10/2016 2239       IMAGING  Ct Head Wo Contrast 07/10/2016 Motion degraded exam demonstrating no definite cerebral infarction or hemorrhage. Mild atrophy and small vessel disease, similar to priors. Asymmetric hypodensity of the LEFT mid pons, favored to represent artifact; if signs and symptoms of brainstem infarction are present, recommend further assessment with MR imaging when the patient is better able to remain motionless.   Mr Brain 44 Contrast Mr Maxine Glenn Head Wo Contrast 07/12/2016 1. Large posterior left MCA territory infarct with cytotoxic edema and trace petechial hemorrhage but no intracranial mass effect at this time. 2. No associated large vessel or left MCA branch occlusion. 3. Intracranial MRA is positive for moderate to severe posterior circulation atherosclerosis and stenosis (involving both distal vertebral arteries and the proximal basilar artery), with mild to moderate atherosclerosis and stenosis in the right anterior circulation (right supraclinoid ICA and right MCA bifurcation). Electronically Signed: By: Odessa Fleming M.D. On: 07/12/2016 16:22   EEG This sedated EEG is abnormal due to moderate low voltage diffuse slowing of the background.   PHYSICAL EXAM Obese middle aged african [place] male who is intubated and sedated   . Afebrile. Head is nontraumatic. Neck is supple without bruit.    Cardiac exam no murmur or gallop. Lungs are clear to auscultation. Distal pulses are well felt. Neurological Exam ;  Sedated on propofol. Intubated. Eyes are open but aphasic and does not follow commands consistently Left gaze preference but can move the eyes past midline to the right with reflex eye movements. Fundi were not visualized. Pupils 4 mm reactive. Corneal reflexes are present. Tongue is midline. Motor system exam patient withdraws to sternal rub on the left side semipurposeful he. Minimal withdrawal response in the right side to painful stimuli. Right plantar not elicitable left  downgoing.   ASSESSMENT/PLAN [name]. is a 66 y.o. male with history of Type II DM, Stage III CKD, HTN, HLD, seizure disorder, BPH, COPD, CVA, chronic back pain with sciatica, and chronic gout presenting with 8 day history of AMS, not communicating x 2 days. He did not receive IV t-PA due to delay in arrival.    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 3. Intracranial MRA is positive for moderate to  severe posterior circulation atherosclerosis and stenosis   Carotid Doppler  pending  2D Echo Left ventricle: The cavity size was normal. Wall thickness was   normal. Systolic function was vigorous. The estimated ejection   fraction was in the range of 65% to 70%. Wall motion was normal;    there were no regional wall motion abnormalities  LDL 64 mg %  HgbA1c pending  Lovenox 40 mg sq daily for VTE prophylaxis Diet NPO time specified  clopidogrel 75 mg daily prior to admission, now on No antithrombotic. Add aspirin per tube for secondary stroke prevention  Ongoing aggressive stroke risk factor management  Therapy recommendations:  pending   Disposition:  pending   Acute respiratory Failure  Intubated last night for respiratory failure  Seizure Disorder  Hx seizures  On Keppra PTA  No observed seizure activity  Check EEG - low voltage, slowing, no sz  Given renal failure, d/c Keppra and start depacon 750 q 8  Hypotension Hx Hypertension  Variable with Low BP  Propofol may be contributing to low BP. discontinue  From stroke standpoint, ok forPermissive hypertension (OK if < 220/120) but gradually normalize in 5-7 days  Long-term BP goal normotensive  Hyperlipidemia  Home meds:  lipitor  80  Resume statin when able in hospital  LDL pending, goal < 70  Continue statin at discharge  DKA Diabetes, uncontrolled Diabetic Neuropathy  Glucose levels elevated  HgbA1c pending , goal < 7.0  Uncontrolled  Diabetes RN following  Dysphagia  ? Secondary to stroke  Exacerbated following intubation  Start TF  Other Stroke Risk Factors  Advanced age  Obesity, Body mass index is 35.89 kg/m., recommend weight loss, diet and exercise as appropriate   Hx stroke/TIA  Details not available  Coronary artery disease  Obstructive sleep apnea, on CPAP at home  Other Active Problems  Acute on chronic Renal failure   hypercalcemia  Sepsis  PNA on abx  Elevated troponin  COPD  Chronic gout  Chronic back pain and sciatica  BPH  Hospital day # 3  [name] See Amion for Pager information 07/13/2016 9:46 AM  I have personally examined this patient, reviewed notes, independently viewed imaging studies, participated in medical decision making and plan of care.ROS completed by me personally and pertinent positives fully documented  I have made any additions or clarifications directly to the above note. Agree with note above. Patient presented with several-day history of right facial and body weakness likely due to left MCA posterior division embolic infarct in setting of hyperosmolar diabetic coma with possible sepsis.  Continue Depacon for seizures and aspirin for stroke prevention. Consider weaning of ventilatory support over the next few days as tolerated. I had a long discussion of the bedside with the patient's wife and explained the prognosis, plan for evaluation, treatment and answered questions. Aggressive hydration and strict control of glucose and sepsis as per pulmonary critical care team.  This patient is critically ill and at significant risk of neurological worsening, death and care requires constant monitoring of vital signs,  hemodynamics,respiratory and cardiac monitoring, extensive review of multiple databases, frequent neurological assessment, discussion with family, other specialists and medical decision making of high complexity.I have made any additions or clarifications directly to the above note.This critical care time does not reflect procedure time, or teaching time or supervisory time of PA/NP/Med Resident etc but could involve care discussion time.  I spent 30 minutes of neurocritical care time  in the  care of  this patient.      Delia Heady, MD Medical Director [facility] Pager: [phone] 07/13/2016 9:46 AM  To contact Stroke Continuity provider, please refer to [email or web address]. After hours, contact General Neurology

## 2016-07-13 NOTE — Progress Notes (Addendum)
PULMONARY / CRITICAL CARE MEDICINE   Name: [name]. MRN: [id number] DOB: 28-Aug-1950    ADMISSION DATE:  07/10/2016 CONSULTATION DATE:  07/11/2016  REFERRING MD:  Holland Falling  CHIEF COMPLAINT:  AMS  Brief:   66 year old male with PMH as below, which is significant for COPD, DM, Hepatitis, HTN, Seizures, OSA, and cardiomyopathy. He was admitted 1/8 by FPTS for HHNK, PNA, and possible CVA after presenting with 8 days AMS and eventually was found sitting outside in the freezing temperature in his underwear. Since time of admission his mentation continued to worsen and he was placed on BiPAP.   SUBJECTIVE:  MRi with cva extensive   VITAL SIGNS: BP (!) 118/56   Pulse 83   Temp (!) 100.4 F (38 C) (Axillary)   Resp 16   Ht 6\' 2"  (1.88 m)   Wt 126.8 kg (279 lb 8.7 oz)   SpO2 100%   BMI 35.89 kg/m   HEMODYNAMICS:    VENTILATOR SETTINGS: Vent Mode: PRVC FiO2 (%):  [40 %] 40 % Set Rate:  [16 bmp] 16 bmp Vt Set:  [650 mL] 650 mL PEEP:  [5 cmH20] 5 cmH20 Plateau Pressure:  [17 cmH20-23 cmH20] 18 cmH20  INTAKE / OUTPUT: I/O last 3 completed shifts: In: 6249 [I.V.:4239.5; NG/GT:850; IV Piggyback:1159.5] Out: 2625 [Urine:2625]  PHYSICAL EXAMINATION: General:  Elderly male, lying in bed  Neuro: Sedated, moves extremities HEENT: ETT Cardiovascular:  RRR, no MRG, NI S1/S2 Lungs:coarse BS mild Abdomen:  Soft, non-tender, non-distended Musculoskeletal:  No acute deformity Skin:  Grossly intact  LABS:  BMET  Recent Labs Lab 07/11/16 1739 07/12/16 0610 07/13/16 0400  NA 141 143 147*  K 4.9 4.5 4.1  CL 113* 115* 120*  CO2 18* 21* 19*  BUN 41* 42* 42*  CREATININE 3.89* 3.53* 2.95*  GLUCOSE 281* 296* 286*    Electrolytes  Recent Labs Lab 07/11/16 1739 07/12/16 0610 07/12/16 1122 07/12/16 1638 07/13/16 0400  CALCIUM 8.9 8.9  --   --  8.5*  MG  --   --  2.4 2.4 2.3  PHOS  --   --  3.9 3.2 3.2    CBC  Recent Labs Lab 07/11/16 0425 07/12/16 0610  07/13/16 0400  WBC 27.7* 18.7* 11.8*  HGB 14.5 13.0 11.1*  HCT 39.8 37.7* 32.4*  PLT 372 276 216    Coag's  Recent Labs Lab 07/10/16 0854  INR 1.19    Sepsis Markers  Recent Labs Lab 07/10/16 1440 07/10/16 1755 07/11/16 0425 07/12/16 0212 07/13/16 0400  LATICACIDVEN 3.6* 2.9* 1.7  --   --   PROCALCITON  --   --   --  0.36 0.46    ABG  Recent Labs Lab 07/11/16 1142 07/11/16 2004 07/12/16 0030  PHART 7.288* 7.329* 7.365  PCO2ART 40.1 36.3 32.5  PO2ART 123.0* 100 196*    Liver Enzymes  Recent Labs Lab 07/10/16 0820  AST 49*  ALT 32  ALKPHOS 165*  BILITOT 1.5*  ALBUMIN 4.6    Cardiac Enzymes  Recent Labs Lab 07/10/16 2111 07/11/16 0137 07/11/16 0425  TROPONINI 0.19* 0.20* 0.20*    Glucose  Recent Labs Lab 07/12/16 1210 07/12/16 1642 07/12/16 1924 07/12/16 2358 07/13/16 0352 07/13/16 0805  GLUCAP 250* 197* 316* 336* 264* 261*    Imaging Mr Maxine Glenn Head Wo Contrast  Addendum Date: 07/12/2016   ADDENDUM REPORT: 07/12/2016 16:42 ADDENDUM: Study discussed by telephone with Dr. Delia Heady on 07/12/2016 at 1635 hours. Electronically Signed  By: Odessa Fleming M.D.   On: 07/12/2016 16:42   Result Date: 07/12/2016 CLINICAL DATA:  66 year old male with 1 week of altered mental status. Found inside his car wearing little clothing when temperatures were below freezing. Hyperglycemia. Recent fever. Initial encounter. EXAM: MRI HEAD WITHOUT CONTRAST MRA HEAD WITHOUT CONTRAST TECHNIQUE: Multiplanar, multiecho pulse sequences of the brain and surrounding structures were obtained without intravenous contrast. Angiographic images of the head were obtained using MRA technique without contrast. COMPARISON:  Head CT without contrast 07/10/2016. Brain MRI and MRA 12/30/2011. FINDINGS: MRI HEAD FINDINGS Brain: Confluent restricted diffusion throughout the posterior left MCA territory a beginning at the mid insula and involving the operculum, posterior left frontal lobe  and left parietal lobe. Cytotoxic edema with gyral swelling throughout the affected area. Trace petechial hemorrhage in the posterior left temporal lobe. No malignant hemorrhagic transformation. No midline shift or significant intracranial mass effect at this time. No other restricted diffusion, no involvement of other vascular territories. No ventriculomegaly. Negative pituitary and cervicomedullary junction. Outside of the left MCA territory stable gray and white matter signal since 2013. No cortical encephalomalacia or chronic cerebral blood products. Vascular: Major intracranial vascular flow voids are stable compared to 2013. See below. Skull and upper cervical spine: Negative. Normal bone marrow signal. Sinuses/Orbits: Negative orbits soft tissues. Trace paranasal sinus mucosal thickening is stable. Other: Small volume retained secretions in the nasopharynx. Trace mastoid fluid. Negative scalp soft tissues. MRA HEAD FINDINGS Superior quality of today MRA versus the 2013 comparison. Diminutive and irregular distal vertebral arteries with antegrade flow mostly on the left. Diminutive and irregular proximal 2/3 of the basilar artery. Diminutive but more normal appearing distal basilar artery with patent SCA origins. Normal right PCA origin. Fetal type left PCA origin. Bilateral PCA branches are within normal limits. The right posterior communicating artery is diminutive or absent. Stable antegrade flow in both ICA siphons. Mild bilateral siphon irregularity. No left siphon stenosis. Mild chronic right supraclinoid siphon stenosis is stable since 2013. The left siphon is dominant owing to the PCA and ACA anatomy. Normal MCA and left ACA origins. Diminutive or absent right A1 segment. Anterior communicating artery and visualized ACA branches are within normal limits. Right MCA M1 segment and bifurcation are patent. There is mild to moderate irregularity and stenosis at the right MCA bifurcation, but no right MCA  branch occlusion is identified. The left MCA M1 segment is patent with mild irregularity and no stenosis. The left MCA bifurcation is patent without stenosis. No left MCA branch occlusion is identified. IMPRESSION: 1. Large posterior left MCA territory infarct with cytotoxic edema and trace petechial hemorrhage but no intracranial mass effect at this time. 2. No associated large vessel or left MCA branch occlusion. 3. Intracranial MRA is positive for moderate to severe posterior circulation atherosclerosis and stenosis (involving both distal vertebral arteries and the proximal basilar artery), with mild to moderate atherosclerosis and stenosis in the right anterior circulation (right supraclinoid ICA and right MCA bifurcation). Electronically Signed: By: Odessa Fleming M.D. On: 07/12/2016 16:22   Mr Brain Wo Contrast  Addendum Date: 07/12/2016   ADDENDUM REPORT: 07/12/2016 16:42 ADDENDUM: Study discussed by telephone with Dr. Delia Heady on 07/12/2016 at 1635 hours. Electronically Signed   By: Odessa Fleming M.D.   On: 07/12/2016 16:42   Result Date: 07/12/2016 CLINICAL DATA:  66 year old male with 1 week of altered mental status. Found inside his car wearing little clothing when temperatures were below freezing. Hyperglycemia. Recent fever. Initial  encounter. EXAM: MRI HEAD WITHOUT CONTRAST MRA HEAD WITHOUT CONTRAST TECHNIQUE: Multiplanar, multiecho pulse sequences of the brain and surrounding structures were obtained without intravenous contrast. Angiographic images of the head were obtained using MRA technique without contrast. COMPARISON:  Head CT without contrast 07/10/2016. Brain MRI and MRA 12/30/2011. FINDINGS: MRI HEAD FINDINGS Brain: Confluent restricted diffusion throughout the posterior left MCA territory a beginning at the mid insula and involving the operculum, posterior left frontal lobe and left parietal lobe. Cytotoxic edema with gyral swelling throughout the affected area. Trace petechial hemorrhage in the  posterior left temporal lobe. No malignant hemorrhagic transformation. No midline shift or significant intracranial mass effect at this time. No other restricted diffusion, no involvement of other vascular territories. No ventriculomegaly. Negative pituitary and cervicomedullary junction. Outside of the left MCA territory stable gray and white matter signal since 2013. No cortical encephalomalacia or chronic cerebral blood products. Vascular: Major intracranial vascular flow voids are stable compared to 2013. See below. Skull and upper cervical spine: Negative. Normal bone marrow signal. Sinuses/Orbits: Negative orbits soft tissues. Trace paranasal sinus mucosal thickening is stable. Other: Small volume retained secretions in the nasopharynx. Trace mastoid fluid. Negative scalp soft tissues. MRA HEAD FINDINGS Superior quality of today MRA versus the 2013 comparison. Diminutive and irregular distal vertebral arteries with antegrade flow mostly on the left. Diminutive and irregular proximal 2/3 of the basilar artery. Diminutive but more normal appearing distal basilar artery with patent SCA origins. Normal right PCA origin. Fetal type left PCA origin. Bilateral PCA branches are within normal limits. The right posterior communicating artery is diminutive or absent. Stable antegrade flow in both ICA siphons. Mild bilateral siphon irregularity. No left siphon stenosis. Mild chronic right supraclinoid siphon stenosis is stable since 2013. The left siphon is dominant owing to the PCA and ACA anatomy. Normal MCA and left ACA origins. Diminutive or absent right A1 segment. Anterior communicating artery and visualized ACA branches are within normal limits. Right MCA M1 segment and bifurcation are patent. There is mild to moderate irregularity and stenosis at the right MCA bifurcation, but no right MCA branch occlusion is identified. The left MCA M1 segment is patent with mild irregularity and no stenosis. The left MCA  bifurcation is patent without stenosis. No left MCA branch occlusion is identified. IMPRESSION: 1. Large posterior left MCA territory infarct with cytotoxic edema and trace petechial hemorrhage but no intracranial mass effect at this time. 2. No associated large vessel or left MCA branch occlusion. 3. Intracranial MRA is positive for moderate to severe posterior circulation atherosclerosis and stenosis (involving both distal vertebral arteries and the proximal basilar artery), with mild to moderate atherosclerosis and stenosis in the right anterior circulation (right supraclinoid ICA and right MCA bifurcation). Electronically Signed: By: Odessa FlemingH  Hall M.D. On: 07/12/2016 16:22   Dg Chest Portable 1 View  Result Date: 07/12/2016 CLINICAL DATA:  Encounter for central line placement EXAM: PORTABLE CHEST 1 VIEW COMPARISON:  1/10/8 FINDINGS: Endotracheal tube 3 cm from carina. NG tube extends the stomach. Interval placement LEFT central venous line with tip in the mid SVC. No pneumothorax. LEFT basilar atelectasis and effusion noted. IMPRESSION: Central venous line placed without complication. Electronically Signed   By: Genevive BiStewart  Edmunds M.D.   On: 07/12/2016 17:03    STUDIES:  CT head 1/8 > Motion degraded exam demonstrating no definite cerebral infarction or hemorrhage. Mild atrophy and small vessel disease, similar to priors. Asymmetric hypodensity of the LEFT mid pons, favored to represent artifact; if  signs and symptoms of brainstem infarction are present, recommend further assessment with MR imaging when the patient is better able to remain motionless.  CULTURES: Blood 1/8 > Urine 1/8 > neg  ANTIBIOTICS: CTX 1/10>>> Zosyn 1/8 >1/10 Vanco 1/8 >>  SIGNIFICANT EVENTS: 1/8 > ED for 8 day history of AMS 1/9 > Intubated for inability to protect airway  1/10 MRI shows left pos MCA cva  LINES/TUBES: ETT 1/9 >>  1/10 left ij>>>  ASSESSMENT / PLAN:  PULMONARY A: Inability to protect airway  secondary to mental status alteration PNA - question aspiration OSA not on CPAP COPD without acute exacerbation  P:   Wean attempt ps escalation if able, cpap 5 Scheduled duoneb, PRN albuterol Rt base atx improved on pcxr, repeat in am   CARDIOVASCULAR A:  Chronic diastolic CHF HTN Borderline BP P:  Telemetry monitoring Holding home lisinopril, carvedilol Consider restart clopidogrel ASA PRN hydralazine With cva avoid MAP less 65, bolus x 1  RENAL A:   AKI on CKD (Base Crt 1.9)  Brain edema P:   Avoid any free water Saline only Allow na to rise Chem in am   GASTROINTESTINAL A:   GERD P:   Pepcid daily NPO Start TF   HEMATOLOGIC A:   dvt prevention P:  Follow CBC Lovenox for VTE ppx- dc with crt clear ence less 30  INFECTIOUS A:   PNA suspected aspiration P:   Dc Acyclovir  Maintain ctx Dc vanc  ENDOCRINE A:   Hyperglycemia  DM HHNK > resolved P:   CBG monitoring and SSI Add Lantus 15 BID  Add TF coverage  NEUROLOGIC A:   Pontine CVA  CVA 2013 Seizure disorder New acute left mca post cva- Present on admission P:   RASS goal: 0 to -1 Neuro following  MRI reviewed Keep map 65 Avoid fevers Control glu Asa, start home plavix? Per neuro NO free water EEG pending  Home Keppra D/C due to Nephrotoxicity - changed to Depakote   FAMILY  - Updates: no family available  - Inter-disciplinary family meet or Palliative Care meeting due by:  1/16 Ccm time 30 min  Mcarthur Rossetti. Tyson Alias, MD, FACP Pgr: [phone] [facility] 07/13/2016 9:12 AM

## 2016-07-13 NOTE — Progress Notes (Addendum)
*  PRELIMINARY RESULTS* Vascular Ultrasound Carotid Duplex (Doppler) has been completed.  Preliminary findings: Carotid duplex attempted, limited exam due to IV dressing and patient positioning.  Findings consistent with a 60 - 79 percent stenosis involving the proximal right internal carotid artery. Images and Doppler evaluation could be underestimated due to suboptimal scanning conditions. Left carotid arteries not imaged due to IV dressing.  Ronald FischerCharlotte C Neila Mckee 07/13/2016, 2:09 PM

## 2016-07-13 NOTE — Progress Notes (Signed)
  Echocardiogram 2D Echocardiogram has been performed.  [name], [name] 07/13/2016, 12:47 PM

## 2016-07-14 ENCOUNTER — Inpatient Hospital Stay (HOSPITAL_COMMUNITY): Payer: Medicare PPO

## 2016-07-14 ENCOUNTER — Other Ambulatory Visit (HOSPITAL_COMMUNITY): Payer: Medicare Other

## 2016-07-14 DIAGNOSIS — I6330 Cerebral infarction due to thrombosis of unspecified cerebral artery: Secondary | ICD-10-CM

## 2016-07-14 DIAGNOSIS — J9600 Acute respiratory failure, unspecified whether with hypoxia or hypercapnia: Secondary | ICD-10-CM

## 2016-07-14 DIAGNOSIS — G936 Cerebral edema: Secondary | ICD-10-CM

## 2016-07-14 LAB — BASIC METABOLIC PANEL
Anion gap: 7 (ref 5–15)
Anion gap: 4 — ABNORMAL LOW (ref 5–15)
BUN: 36 mg/dL — ABNORMAL HIGH (ref 6–20)
BUN: 38 mg/dL — ABNORMAL HIGH (ref 6–20)
CALCIUM: 8.7 mg/dL — AB (ref 8.9–10.3)
CALCIUM: 8.9 mg/dL (ref 8.9–10.3)
CO2: 20 mmol/L — AB (ref 22–32)
CO2: 23 mmol/L (ref 22–32)
CREATININE: 2.45 mg/dL — AB (ref 0.61–1.24)
CREATININE: 2.36 mg/dL — AB (ref 0.61–1.24)
Chloride: 127 mmol/L — ABNORMAL HIGH (ref 101–111)
Chloride: 128 mmol/L — ABNORMAL HIGH (ref 101–111)
GFR calc non Af Amer: 26 mL/min — ABNORMAL LOW (ref >60)
GFR, EST AFRICAN AMERICAN: 30 mL/min — AB (ref >60)
GFR, EST AFRICAN AMERICAN: 32 mL/min — AB (ref >60)
GFR, EST NON AFRICAN AMERICAN: 27 mL/min — AB (ref >60)
GLUCOSE: 214 mg/dL — AB (ref 65–99)
GLUCOSE: 345 mg/dL — AB (ref 65–99)
Potassium: 3.9 mmol/L (ref 3.5–5.1)
Potassium: 4.4 mmol/L (ref 3.5–5.1)
Sodium: 154 mmol/L — ABNORMAL HIGH (ref 135–145)
Sodium: 155 mmol/L — ABNORMAL HIGH (ref 135–145)

## 2016-07-14 LAB — CBC WITH DIFFERENTIAL/PLATELET
Basophils Absolute: 0.1 10*3/uL (ref 0.0–0.1)
Basophils Relative: 0 %
EOS ABS: 0.4 10*3/uL (ref 0.0–0.7)
EOS PCT: 3 %
HCT: 33.5 % — ABNORMAL LOW (ref 39.0–52.0)
Hemoglobin: 11.3 g/dL — ABNORMAL LOW (ref 13.0–17.0)
LYMPHS ABS: 3 10*3/uL (ref 0.7–4.0)
Lymphocytes Relative: 24 %
MCH: 24.9 pg — AB (ref 26.0–34.0)
MCHC: 33.7 g/dL (ref 30.0–36.0)
MCV: 74 fL — ABNORMAL LOW (ref 78.0–100.0)
MONOS PCT: 12 %
Monocytes Absolute: 1.5 10*3/uL — ABNORMAL HIGH (ref 0.1–1.0)
Neutro Abs: 7.7 10*3/uL (ref 1.7–7.7)
Neutrophils Relative %: 61 %
PLATELETS: 266 10*3/uL (ref 150–400)
RBC: 4.53 MIL/uL (ref 4.22–5.81)
RDW: 16.8 % — ABNORMAL HIGH (ref 11.5–15.5)
WBC: 12.6 10*3/uL — AB (ref 4.0–10.5)

## 2016-07-14 LAB — GLUCOSE, CAPILLARY
GLUCOSE-CAPILLARY: 178 mg/dL — AB (ref 65–99)
GLUCOSE-CAPILLARY: 256 mg/dL — AB (ref 65–99)
GLUCOSE-CAPILLARY: 276 mg/dL — AB (ref 65–99)
Glucose-Capillary: 135 mg/dL — ABNORMAL HIGH (ref 65–99)
Glucose-Capillary: 250 mg/dL — ABNORMAL HIGH (ref 65–99)

## 2016-07-14 LAB — VALPROIC ACID LEVEL: Valproic Acid Lvl: 116 ug/mL — ABNORMAL HIGH (ref 50.0–100.0)

## 2016-07-14 LAB — PROCALCITONIN: Procalcitonin: 0.37 ng/mL

## 2016-07-14 MED ORDER — INSULIN ASPART 100 UNIT/ML ~~LOC~~ SOLN
6.0000 [IU] | SUBCUTANEOUS | Status: DC
  Administered 2016-07-15 – 2016-07-17 (×14): 6 [IU] via SUBCUTANEOUS

## 2016-07-14 MED ORDER — VALPROATE SODIUM 500 MG/5ML IV SOLN
750.0000 mg | Freq: Three times a day (TID) | INTRAVENOUS | Status: DC
  Administered 2016-07-14 – 2016-07-15 (×2): 750 mg via INTRAVENOUS
  Filled 2016-07-14 (×3): qty 7.5

## 2016-07-14 MED ORDER — VALPROATE SODIUM 500 MG/5ML IV SOLN
750.0000 mg | Freq: Four times a day (QID) | INTRAVENOUS | Status: DC
  Administered 2016-07-14: 750 mg via INTRAVENOUS
  Filled 2016-07-14 (×2): qty 7.5

## 2016-07-14 NOTE — Progress Notes (Signed)
eLink Physician-Brief Progress Note Patient Name: Ronald FordLouis Lievanos Jr. DOB: 05/12/1951 MRN: [id number]   Date of Service  07/14/2016  HPI/Events of Note  Blood glucose = 250.  eICU Interventions  Will increase Q 4 hour tube feeding coverage to 6 units per recommendation of Diabetes Coordinator.      Intervention Category Major Interventions: Hyperglycemia - active titration of insulin therapy  Lenell AntuSommer,Steven Eugene 07/14/2016, 8:49 PM

## 2016-07-14 NOTE — Progress Notes (Signed)
Inpatient Diabetes Program Recommendations  AACE/ADA: New Consensus Statement on Inpatient Glycemic Control (2015)  Target Ranges:  Prepandial:   less than 140 mg/dL      Peak postprandial:   less than 180 mg/dL (1-2 hours)      Critically ill patients:  140 - 180 mg/dL   Results for [name], Beth JR. (MRN [id number]) as of 07/14/2016 12:35  Ref. Range 07/13/2016 23:48 07/14/2016 04:02 07/14/2016 07:49 07/14/2016 11:51  Glucose-Capillary Latest Ref Range: 65 - 99 mg/dL 409206 (H) 811178 (H) 914135 (H) 256 (H)    Home DM Meds: None listed  Current Insulin Orders: Lantus 15 units BID      Novolog Moderate Correction Scale/ SSI (0-15 units) Q4 hours         Novolog 4 units Q4 hours     MD- Note Novolog 4 units Q4 hours (tube feed coverage) started yesterday AM.  Patient still having elevated glucose levels.  Please consider increasing Novolog tube feed coverage to: Novolog 6 units Q4 hours (hold if tube feeds held for any reason)     --Will follow patient during hospitalization--  Ambrose FinlandJeannine Johnston Gianne Shugars RN, MSN, CDE Diabetes Coordinator Inpatient Glycemic Control Team Team Pager: [phone] (8a-5p)

## 2016-07-14 NOTE — Progress Notes (Signed)
STROKE TEAM PROGRESS NOTE   HISTORY OF PRESENT ILLNESS (per record) [name]. is an 66 y.o. Mckee who presented with an 8 day history of AMS on 1/8. He had been found in his car with very little clothing on in temperatures that were significantly below freezing. H&P documents that family endorsed right sided weakness and inability to speak for about 2 days with increasing confusion over the past 24 hours. At baseline he is able to walk and talk normally. Physical exam was significant for right sided weakness in the upper and lower extremities. CT head was obtained and showed no acute bleed, but a possible pontine stroke on the left. This evening (1/9) his ability to protect his airway has become more of a concern and Dr. Molli Mckee was Mckee to intubate, noting on his exam right sided weakness. Neurology was Mckee to further evaluate prior to intubation. During this admission, in addition to right sided weakness, he has been unable to converse and participate in full neurological testing.   The patient has a complex PMHx including DM2, morbid obesity, OSA, COPD, CKD3, HTN, HLD, seizure disorder, DM2, BPH, chronic back pain with sciatica, chronic gout and prior CVA.   Of note, labs were significant for an extremely elevated glucose of 1,141. His Na was 120 and potassium of 6.4. Creatinine was 3.43. Initial lactic acid was 3.96. Ethanol, TSH and urine drug screen were all unremarkable. CXR showed bibasilar atelectasis consistent with a possible pneumonia. Because of the concern for HHS he was started on normal saline boluses as well as an insulin drip with subsequent resolution of the anion gap in his anion gap metabolic acidosis. He was also started on vancomycin and Zosyn for treatment of pneumonia given the concern for sepsis and also of the possibility of aspiration pneumonia given the history of 8 days of worsening altered mental status.   Patient was not considered for IV t-PA / IR secondary to  being outside of treatment window.   SUBJECTIVE (INTERVAL HISTORY) His wife is at the bedside.  He ha dtransient e[isode of twitchings this am. EEG y`day was normal. He is on depacon 750 mg q 8 hrly/ level is pending  Overall  his condition is  stable.  MRI shows left posterior division MCA embolic infarct  OBJECTIVE Temp:  [98.8 F (37.1 C)-102.2 F (39 C)] 98.8 F (37.1 C) (01/12 0753) Pulse Rate:  [69-115] 81 (01/12 0900) Cardiac Rhythm: Normal sinus rhythm (01/12 0741) Resp:  [16-21] 16 (01/12 0900) BP: (100-189)/(52-100) 182/100 (01/12 0900) SpO2:  [97 %-100 %] 99 % (01/12 0900) FiO2 (%):  [40 %] 40 % (01/12 0746) Weight:  [128 kg (282 lb 3 oz)] 128 kg (282 lb 3 oz) (01/12 0500)  CBC:  Recent Labs Lab 07/10/16 0820  07/13/16 0400 07/14/16 0430  WBC 17.7*  < > 11.8* 12.6*  NEUTROABS 15.1*  --   --  7.7  HGB 16.9  < > 11.1* 11.3*  HCT 46.7  < > 32.4* 33.5*  MCV 71.1*  < > 72.6* 74.0*  PLT 430*  < > 216 266  < > = values in this interval not displayed.  Basic Metabolic Panel:   Recent Labs Lab 07/13/16 0400 07/13/16 1619 07/14/16 0430  NA 147*  --  154*  K 4.1  --  3.9  CL 120*  --  127*  CO2 19*  --  20*  GLUCOSE 286*  --  214*  BUN 42*  --  36*  CREATININE 2.95*  --  2.45*  CALCIUM 8.5*  --  8.7*  MG 2.3 2.2  --   PHOS 3.2 3.4  --     Lipid Panel:     Component Value Date/Time   CHOL 110 07/13/2016 0400   TRIG 152 (H) 07/13/2016 0400   HDL 16 (L) 07/13/2016 0400   CHOLHDL 6.9 07/13/2016 0400   VLDL 30 07/13/2016 0400   LDLCALC 64 07/13/2016 0400   HgbA1c:  Lab Results  Component Value Date   HGBA1C 12.3 (H) 07/10/2016   Urine Drug Screen:     Component Value Date/Time   LABOPIA NONE DETECTED 07/10/2016 2239   COCAINSCRNUR NONE DETECTED 07/10/2016 2239   LABBENZ NONE DETECTED 07/10/2016 2239   AMPHETMU NONE DETECTED 07/10/2016 2239   THCU NONE DETECTED 07/10/2016 2239   LABBARB NONE DETECTED 07/10/2016 2239      IMAGING  Ct Head Wo  Contrast 07/10/2016 Motion degraded exam demonstrating no definite cerebral infarction or hemorrhage. Mild atrophy and small vessel disease, similar to priors. Asymmetric hypodensity of the LEFT mid pons, favored to represent artifact; if signs and symptoms of brainstem infarction are present, recommend further assessment with Ronald imaging when the patient is better able to remain motionless.   Ronald Brain 76Wo Contrast [name] Head Wo Contrast 07/12/2016 1. Large posterior left MCA territory infarct with cytotoxic edema and trace petechial hemorrhage but no intracranial mass effect at this time. 2. No associated large vessel or left MCA branch occlusion. 3. Intracranial MRA is positive for moderate to severe posterior circulation atherosclerosis and stenosis (involving both distal vertebral arteries and the proximal basilar artery), with mild to moderate atherosclerosis and stenosis in the right anterior circulation (right supraclinoid ICA and right MCA bifurcation). Electronically Signed: By: Ronald FlemingH  Hall M.D. On: 07/12/2016 16:22   EEG This sedated EEG is abnormal due to moderate low voltage diffuse slowing of the background.   PHYSICAL EXAM Obese middle aged african [place] Mckee who is intubated and sedated   . Afebrile. Head is nontraumatic. Neck is supple without bruit.    Cardiac exam no murmur or gallop. Lungs are clear to auscultation. Distal pulses are well felt. Neurological Exam ;  Awake restless. Intubated. Eyes are open but aphasic and does not follow commands consistently. Will grip with left hand only. Left gaze preference but can move the eyes past midline to the right with reflex eye movements. Fundi were not visualized. Pupils 4 mm reactive. Corneal reflexes are present. Tongue is midline. Motor system exam purposeful antigravity movements on left and will occasionally move right side spontaneously but hemiplegic. Minimal withdrawal response in the right side to painful stimuli. Right plantar not  elicitable left downgoing.   ASSESSMENT/PLAN Ronald. [name] with history of Type II DM, Stage III CKD, HTN, HLD, seizure disorder, BPH, COPD, CVA, chronic back pain with sciatica, and chronic gout presenting with 8 day history of AMS, not communicating x 2 days. He did not receive IV t-PA due to delay in arrival.    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 3. Intracranial MRA is positive for moderate to severe posterior circulation atherosclerosis and stenosis   Carotid Doppler  pending  2D  Echo Left ventricle: The cavity size was normal. Wall thickness was   normal. Systolic function was vigorous. The estimated ejection   fraction was in the range of 65% to 70%. Wall motion was normal;    there were no regional wall motion abnormalities  LDL 64 mg %  HgbA1c pending  Lovenox 40 mg sq daily for VTE prophylaxis Diet NPO time specified  clopidogrel 75 mg daily prior to admission, now on No antithrombotic. Add aspirin per tube for secondary stroke prevention  Ongoing aggressive stroke risk factor management  Therapy recommendations:  pending   Disposition:  pending   Acute respiratory Failure  Intubated   for respiratory failure  Seizure Disorder  Hx seizures  On Keppra PTA  No observed seizure activity  Check EEG - low voltage, slowing, no sz  Given renal failure, d/c Keppra and started depacon 750 q 8  Had some twitch for about 1 min during the night  Check depakote level....116. Will not increase depacon level  No need for repeat EEG. Will cancel  Hypotension Hx Hypertension  Variable with Low BP  Propofol may be contributing to low BP. discontinue  From stroke  standpoint, ok forPermissive hypertension (OK if < 220/120) but gradually normalize in 5-7 days  Long-term BP goal normotensive  Hyperlipidemia  Home meds:  lipitor 80  Resume statin when able in hospital  LDL pending, goal < 70  Continue statin at discharge  DKA Diabetes, uncontrolled Diabetic Neuropathy  Glucose levels elevated  HgbA1c pending , goal < 7.0  Uncontrolled  Diabetes RN following  Dysphagia  ? Secondary to stroke  Exacerbated following intubation  Start TF  Other Stroke Risk Factors  Advanced age  Obesity, Body mass index is 36.23 kg/m., recommend weight loss, diet and exercise as appropriate   Hx stroke/TIA  Details not available  Coronary artery disease  Obstructive sleep apnea, on CPAP at home  Other Active Problems  Acute on chronic Renal failure   hypercalcemia  Sepsis  PNA on abx  Elevated troponin  COPD  Chronic gout  Chronic back pain and sciatica  BPH  Simple partial seizures- on [facility] day # 4 I have personally examined this patient, reviewed notes, independently viewed imaging studies, participated in medical decision making and plan of care.ROS completed by me personally and pertinent positives fully documented  I have made any additions or clarifications directly to the above note. Plan check valproic acid level and continue depacon for seizures. No need to repeat EEG. Extubate as per PCCM. D.W wife at bedside and answered questions. This patient is critically ill and at significant risk of neurological worsening, death and care requires constant monitoring of vital signs, hemodynamics,respiratory and cardiac monitoring, extensive review of multiple databases, frequent neurological assessment, discussion with family, other specialists and medical decision making of high complexity.I have made any additions or clarifications directly to the above note.This critical care time does not reflect procedure time,  or teaching time or supervisory time of PA/NP/Med Resident etc but could involve care discussion time.  I spent 30 minutes of neurocritical care time  in the care of  this patient.     Delia Heady, MD Medical Director [facility] Stroke Center Pager: [phone] 07/14/2016 1:52 PM   To contact Stroke Continuity provider, please refer to [email or web address]. After hours, contact General Neurology

## 2016-07-14 NOTE — Progress Notes (Signed)
Patient failed SBT due to apneic, rr <5. RT will continue to monitor.

## 2016-07-14 NOTE — Plan of Care (Signed)
depacon level called to Gur[place], Stroke Team

## 2016-07-14 NOTE — Progress Notes (Signed)
Pt had two episodes of twitching with fixed gaze upon stimulation that lasted for less than 1 minute a piece. Pt w/ hx of seizures. CCM called and made aware. Episodes had passed at this time when Dr. Vassie LollAlva was able to camera in. Asked to page neuro and make aware. Dr. Gaspar SkeetersLinzid spoke w/ and made aware. Order received for EEG. EEG tech paged and this nurse was informed that since the pt was awake w/ spontaneous and purposeful movement he did not meet criteria for a PM stat EEG. Neuro paged and made aware. MD ok with EEG done in [place] [facility] [place]

## 2016-07-14 NOTE — Plan of Care (Signed)
Problem: Education: Goal: Knowledge of disease or condition will improve Outcome: Not Progressing Pt remains intubated w/ AMS Goal: Knowledge of secondary prevention will improve Outcome: Not Progressing See previous  Problem: Coping: Goal: Ability to verbalize positive feelings about self will improve Outcome: Not Progressing Pt remains intubated  Problem: Nutrition: Goal: Dietary intake will improve Outcome: Progressing TF started 07/12/2016

## 2016-07-14 NOTE — Progress Notes (Signed)
PULMONARY / CRITICAL CARE MEDICINE   Name: [name]. MRN: [id number] DOB: 07-03-1951    ADMISSION DATE:  07/10/2016 CONSULTATION DATE:  07/11/2016  REFERRING MD:  Holland Falling  CHIEF COMPLAINT:  AMS  Brief:   66 year old male with PMH as below, which is significant for COPD, DM, Hepatitis, HTN, Seizures, OSA, and cardiomyopathy. He was admitted 1/8 by FPTS for HHNK, PNA, and possible CVA after presenting with 8 days AMS and eventually was found sitting outside in the freezing temperature in his underwear. Since time of admission his mentation continued to worsen and he was placed on BiPAP.   SUBJECTIVE:  Currently on fentanyl, was tachypneic and tachycardic on PSV earlier this am Febrile last pm, repeat blood cx obtained. Note vanco stopped yesterday   VITAL SIGNS: BP (!) 154/78   Pulse 80   Temp 98.8 F (37.1 C) Comment (Src): cooling blanket  Resp 16   Ht 6\' 2"  (1.88 m)   Wt 128 kg (282 lb 3 oz)   SpO2 97%   BMI 36.23 kg/m   HEMODYNAMICS: CVP:  [8 mmHg-9 mmHg] 9 mmHg  VENTILATOR SETTINGS: Vent Mode: PRVC FiO2 (%):  [40 %] 40 % Set Rate:  [16 bmp] 16 bmp Vt Set:  [650 mL] 650 mL PEEP:  [5 cmH20] 5 cmH20 Plateau Pressure:  [18 cmH20-26 cmH20] 26 cmH20  INTAKE / OUTPUT: I/O last 3 completed shifts: In: 4881.5 [I.V.:2251.5; NG/GT:1580; IV Piggyback:1050] Out: 4311 [Urine:4310; Stool:1]  PHYSICAL EXAMINATION: General:  Obese man, ventilated and sedated,  Neuro: opened eyes and moved spont to voice and stim, did not follow commands for me but did on the R for RN on exam earlier.  HEENT: ETT in place, PERRL, Op clear Cardiovascular:  RRR, distant, no M heard Lungs: coarse BS bilaterally, decreased at bases, good cough Abdomen: obese, protuberant, benign, + BS Musculoskeletal:  No edema, deformity Skin: no rash, no sores reported.   LABS:  BMET  Recent Labs Lab 07/12/16 0610 07/13/16 0400 07/14/16 0430  NA 143 147* 154*  K 4.5 4.1 3.9  CL 115*  120* 127*  CO2 21* 19* 20*  BUN 42* 42* 36*  CREATININE 3.53* 2.95* 2.45*  GLUCOSE 296* 286* 214*    Electrolytes  Recent Labs Lab 07/12/16 0610  07/12/16 1638 07/13/16 0400 07/13/16 1619 07/14/16 0430  CALCIUM 8.9  --   --  8.5*  --  8.7*  MG  --   < > 2.4 2.3 2.2  --   PHOS  --   < > 3.2 3.2 3.4  --   < > = values in this interval not displayed.  CBC  Recent Labs Lab 07/12/16 0610 07/13/16 0400 07/14/16 0430  WBC 18.7* 11.8* 12.6*  HGB 13.0 11.1* 11.3*  HCT 37.7* 32.4* 33.5*  PLT 276 216 266    Coag's  Recent Labs Lab 07/10/16 0854  INR 1.19    Sepsis Markers  Recent Labs Lab 07/10/16 1440 07/10/16 1755 07/11/16 0425 07/12/16 0212 07/13/16 0400 07/14/16 0430  LATICACIDVEN 3.6* 2.9* 1.7  --   --   --   PROCALCITON  --   --   --  0.36 0.46 0.37    ABG  Recent Labs Lab 07/11/16 1142 07/11/16 2004 07/12/16 0030  PHART 7.288* 7.329* 7.365  PCO2ART 40.1 36.3 32.5  PO2ART 123.0* 100 196*    Liver Enzymes  Recent Labs Lab 07/10/16 0820  AST 49*  ALT 32  ALKPHOS 165*  BILITOT  1.5*  ALBUMIN 4.6    Cardiac Enzymes  Recent Labs Lab 07/10/16 2111 07/11/16 0137 07/11/16 0425  TROPONINI 0.19* 0.20* 0.20*    Glucose  Recent Labs Lab 07/13/16 1233 07/13/16 1616 07/13/16 1933 07/13/16 2348 07/14/16 0402 07/14/16 0749  GLUCAP 322* 249* 224* 206* 178* 135*    Imaging Dg Chest Port 1 View  Result Date: 07/14/2016 CLINICAL DATA:  Hypoxia EXAM: PORTABLE CHEST 1 VIEW COMPARISON:  July 12, 2016 FINDINGS: Endotracheal tube tip is 2.8 cm above the carina. Central catheter tip is at the junction of the left innominate vein and superior vena cava, stable. Nasogastric tube tip and side port are below the diaphragm. No pneumothorax. There is patchy airspace consolidation in the lung bases. There are small pleural effusions bilaterally. Heart is mildly enlarged with mild pulmonary venous hypertension. There is aortic atherosclerosis. No  adenopathy. No bone lesions. IMPRESSION: Tube and catheter positions as described without pneumothorax. Evidence of a degree of congestive heart failure. Question alveolar edema versus patchy pneumonia in the lung bases. Both entities may exist concurrently. Electronically Signed   By: Bretta Bang III M.D.   On: 07/14/2016 07:42    STUDIES:  CT head 1/8 > Motion degraded exam demonstrating no definite cerebral infarction or hemorrhage. Mild atrophy and small vessel disease, similar to priors. Asymmetric hypodensity of the LEFT mid pons, favored to represent artifact; if signs and symptoms of brainstem infarction are present, recommend further assessment with MR imaging when the patient is better able to remain motionless. MRI brain 1/10 >> large L MCA posterior CVA with associated edema, trace petechial hemorrhage, no mass effect; notable posterior circ stenosis, R anterior circ EEG 1/10 >> no active seizures, global cerebral dysfunction.   CULTURES: Blood 1/8 > 1 of 2 GPC >>  Urine 1/8 > negative Blood 1/11 >>   ANTIBIOTICS: CTX 1/10>>> Zosyn 1/8 >1/10 Vanco 1/8 >> 1/11  SIGNIFICANT EVENTS: 1/8 > ED for 8 day history of AMS 1/9 > Intubated for inability to protect airway  1/10 MRI shows left pos MCA cva  LINES/TUBES: ETT 1/9 >>  1/10 left ij>>>  ASSESSMENT / PLAN:  PULMONARY A: Inability to protect airway secondary to mental status alteration PNA - question aspiration OSA not on CPAP COPD without acute exacerbation  P:   Continue efforts at WUA and PSV. Goal extubate when MS and airway protection deemed adequate.  Scheduled duoneb BD's Follow CXR, pulm hygiene  CARDIOVASCULAR A:  Chronic diastolic CHF, some possible acute component based on CXR HTN Borderline BP P:  Telemetry monitoring Holding home lisinopril, carvedilol for now; restart when BP will tolerate Consider restart clopidogrel > would defer until OK with Neurology given hemorrhage on MRI Brain ASA  already ordered PRN hydralazine Follow BP, goal MAP > 65  RENAL A:   AKI on CKD (Base Crt 1.9)  Brain edema P:   No shift but at risk due to brain edema; minimize free water and allow Na to drift up, current 154 follow serial BMP  GASTROINTESTINAL A:   GERD P:   Pepcid daily NPO Continue TF's   HEMATOLOGIC A:   dvt prevention P:  Follow CBC SCD's; hold enoxaparin  INFECTIOUS A:   PNA, suspected aspiration P:   Off Acyclovir  Continue ceftriaxone, day # 3 (abx day # 5) Dc vanco and follow; note 1 of 2 BC GPC's  ENDOCRINE A:   Hyperglycemia  DM HHNK > resolved P:   CBG monitoring and SSI Continue Lantus  15 BID  Continue TF coverage 4u regular  NEUROLOGIC A:   Hx [place] CVA  Seizure disorder, no active seizures EEG 1/10 New acute left mca posterior cva P:   RASS goal: 0 to -1 Neuro following  Goal MAP > 65 Avoid fevers Control glu Asa ordered; deferring any other ant-plt or anticoagulation pending neuro approval Home Keppra D/C'd due to Nephrotoxicity - changed to Depakote, appears to be tolerating  FAMILY  - Updates: RB updated wife, sister and granddaughter at bedside  1/12  - Inter-disciplinary family meet or Palliative Care meeting due by:  1/16  Independent CC time 35 minutes  Levy Pupaobert Eliyas Suddreth, MD, PhD 07/14/2016, 11:05 AM [facility] [phone] or if no answer [phone]

## 2016-07-15 DIAGNOSIS — R509 Fever, unspecified: Secondary | ICD-10-CM

## 2016-07-15 DIAGNOSIS — R569 Unspecified convulsions: Secondary | ICD-10-CM

## 2016-07-15 DIAGNOSIS — E1311 Other specified diabetes mellitus with ketoacidosis with coma: Secondary | ICD-10-CM

## 2016-07-15 LAB — CBC
HCT: 35.4 % — ABNORMAL LOW (ref 39.0–52.0)
HEMOGLOBIN: 11.9 g/dL — AB (ref 13.0–17.0)
MCH: 25.1 pg — AB (ref 26.0–34.0)
MCHC: 33.6 g/dL (ref 30.0–36.0)
MCV: 74.7 fL — ABNORMAL LOW (ref 78.0–100.0)
Platelets: 284 10*3/uL (ref 150–400)
RBC: 4.74 MIL/uL (ref 4.22–5.81)
RDW: 17.1 % — ABNORMAL HIGH (ref 11.5–15.5)
WBC: 15.1 10*3/uL — ABNORMAL HIGH (ref 4.0–10.5)

## 2016-07-15 LAB — BASIC METABOLIC PANEL
ANION GAP: 8 (ref 5–15)
BUN: 40 mg/dL — ABNORMAL HIGH (ref 6–20)
CO2: 19 mmol/L — AB (ref 22–32)
Calcium: 9 mg/dL (ref 8.9–10.3)
Chloride: 127 mmol/L — ABNORMAL HIGH (ref 101–111)
Creatinine, Ser: 2.01 mg/dL — ABNORMAL HIGH (ref 0.61–1.24)
GFR calc Af Amer: 38 mL/min — ABNORMAL LOW (ref >60)
GFR calc non Af Amer: 33 mL/min — ABNORMAL LOW (ref >60)
GLUCOSE: 302 mg/dL — AB (ref 65–99)
POTASSIUM: 3.9 mmol/L (ref 3.5–5.1)
Sodium: 154 mmol/L — ABNORMAL HIGH (ref 135–145)

## 2016-07-15 LAB — GLUCOSE, CAPILLARY
GLUCOSE-CAPILLARY: 149 mg/dL — AB (ref 65–99)
GLUCOSE-CAPILLARY: 211 mg/dL — AB (ref 65–99)
GLUCOSE-CAPILLARY: 160 mg/dL — AB (ref 65–99)
GLUCOSE-CAPILLARY: 191 mg/dL — AB (ref 65–99)
Glucose-Capillary: 135 mg/dL — ABNORMAL HIGH (ref 65–99)
Glucose-Capillary: 167 mg/dL — ABNORMAL HIGH (ref 65–99)
Glucose-Capillary: 192 mg/dL — ABNORMAL HIGH (ref 65–99)
Glucose-Capillary: 210 mg/dL — ABNORMAL HIGH (ref 65–99)

## 2016-07-15 LAB — URINALYSIS, ROUTINE W REFLEX MICROSCOPIC
BILIRUBIN URINE: NEGATIVE
Glucose, UA: >=500 mg/dL — AB
Ketones, ur: NEGATIVE mg/dL
LEUKOCYTES UA: NEGATIVE
Nitrite: NEGATIVE
PROTEIN: 100 mg/dL — AB
SPECIFIC GRAVITY, URINE: 1.012 (ref 1.005–1.030)
SQUAMOUS EPITHELIAL / LPF: NONE SEEN
pH: 5 (ref 5.0–8.0)

## 2016-07-15 LAB — VALPROIC ACID LEVEL: Valproic Acid Lvl: 75 ug/mL (ref 50.0–100.0)

## 2016-07-15 LAB — CULTURE, BLOOD (ROUTINE X 2): Culture: NO GROWTH

## 2016-07-15 LAB — CREATININE, URINE, RANDOM: Creatinine, Urine: 97.7 mg/dL

## 2016-07-15 LAB — SODIUM, URINE, RANDOM: SODIUM UR: 42 mmol/L

## 2016-07-15 LAB — AMMONIA: AMMONIA: 29 umol/L (ref 9–35)

## 2016-07-15 MED ORDER — ATORVASTATIN CALCIUM 80 MG PO TABS
80.0000 mg | ORAL_TABLET | Freq: Every day | ORAL | Status: DC
  Administered 2016-07-15 – 2016-07-20 (×6): 80 mg via ORAL
  Filled 2016-07-15 (×6): qty 1

## 2016-07-15 MED ORDER — VALPROATE SODIUM 250 MG/5ML PO SOLN
750.0000 mg | Freq: Three times a day (TID) | ORAL | Status: DC
  Administered 2016-07-15 – 2016-07-20 (×15): 750 mg via ORAL
  Filled 2016-07-15 (×16): qty 15

## 2016-07-15 NOTE — Progress Notes (Signed)
Family Medicine Teaching Service Social Note  Patient name: Ronald FordLouis Beverley Jr. Medical record number: [id number] Date of birth: 10/13/1950 Age: 66 y.o. Gender: male  Primary Care Provider: Caryl AdaJazma Phelps, DO Code Status: Full  Patient remains intubated due to AMS and inability to protect airway. CCM primary and Neurology consulting for left MCA infarct. Suspected aspiration pneumonia. Now off insulin drip for HHNK.   Appreciate the wonderful care CCM and Neurology is providing. Plan to accept patient to FMTS once stable.   Casey BurkittHillary Moen Fitzgerald, MD 07/15/2016, 8:23 AM PGY-2, [place] [facility] FPTS Intern pager: [phone], text pages welcome

## 2016-07-15 NOTE — Progress Notes (Signed)
PULMONARY / CRITICAL CARE MEDICINE   Name: [name]. MRN: [id number] DOB: 1950-08-02    ADMISSION DATE:  07/10/2016 CONSULTATION DATE:  07/11/2016  REFERRING MD:  Holland Falling  CHIEF COMPLAINT:  AMS  Brief:   66 year old male with PMH as below, which is significant for COPD, DM, Hepatitis, HTN, Seizures, OSA, and cardiomyopathy. He was admitted 1/8 by FPTS for HHNK, PNA, and possible CVA after presenting with 8 days AMS and eventually was found sitting outside in the freezing temperature in his underwear. Since time of admission his mentation continued to worsen and he was placed on BiPAP.   SUBJECTIVE:  Weaning , not following commands.   VITAL SIGNS: BP (!) 118/54   Pulse (!) 101   Temp 99.1 F (37.3 C) (Rectal)   Resp 11   Ht 6\' 2"  (1.88 m)   Wt 131.7 kg (290 lb 5.5 oz) Comment: patient is on water cooling blanket  SpO2 100%   BMI 37.28 kg/m   HEMODYNAMICS:    VENTILATOR SETTINGS: Vent Mode: PSV;CPAP FiO2 (%):  [40 %] 40 % Set Rate:  [16 bmp] 16 bmp Vt Set:  [650 mL] 650 mL PEEP:  [5 cmH20] 5 cmH20 Pressure Support:  [10 cmH20-15 cmH20] 10 cmH20 Plateau Pressure:  [17 cmH20-25 cmH20] 17 cmH20  INTAKE / OUTPUT: I/O last 3 completed shifts: In: 4702.1 [I.V.:2412.1; NG/GT:1775; IV Piggyback:515] Out: 1191 [YNWGN:5621; Stool:1]  PHYSICAL EXAMINATION: General:  Obese man, ventilated and sedated,  Neuro: opened eyes and moved spont to voice and stim, did not follow commands for me but did on the R for RN on exam earlier.  HEENT: ETT in place, PERRL, Op clear Cardiovascular:  RRR, distant, no M heard Lungs: coarse BS bilaterally, decreased at bases, good cough Abdomen: obese, protuberant, benign, + BS Musculoskeletal:  No edema, deformity Skin: no rash, no sores reported.   LABS:  BMET  Recent Labs Lab 07/14/16 0430 07/14/16 1847 07/15/16 0316  NA 154* 155* 154*  K 3.9 4.4 3.9  CL 127* 128* 127*  CO2 20* 23 19*  BUN 36* 38* 40*  CREATININE  2.45* 2.36* 2.01*  GLUCOSE 214* 345* 302*    Electrolytes  Recent Labs Lab 07/12/16 1638 07/13/16 0400 07/13/16 1619 07/14/16 0430 07/14/16 1847 07/15/16 0316  CALCIUM  --  8.5*  --  8.7* 8.9 9.0  MG 2.4 2.3 2.2  --   --   --   PHOS 3.2 3.2 3.4  --   --   --     CBC  Recent Labs Lab 07/13/16 0400 07/14/16 0430 07/15/16 0316  WBC 11.8* 12.6* 15.1*  HGB 11.1* 11.3* 11.9*  HCT 32.4* 33.5* 35.4*  PLT 216 266 284    Coag's  Recent Labs Lab 07/10/16 0854  INR 1.19    Sepsis Markers  Recent Labs Lab 07/10/16 1440 07/10/16 1755 07/11/16 0425 07/12/16 0212 07/13/16 0400 07/14/16 0430  LATICACIDVEN 3.6* 2.9* 1.7  --   --   --   PROCALCITON  --   --   --  0.36 0.46 0.37    ABG  Recent Labs Lab 07/11/16 1142 07/11/16 2004 07/12/16 0030  PHART 7.288* 7.329* 7.365  PCO2ART 40.1 36.3 32.5  PO2ART 123.0* 100 196*    Liver Enzymes  Recent Labs Lab 07/10/16 0820  AST 49*  ALT 32  ALKPHOS 165*  BILITOT 1.5*  ALBUMIN 4.6    Cardiac Enzymes  Recent Labs Lab 07/10/16 2111 07/11/16 0137 07/11/16 0425  TROPONINI 0.19* 0.20* 0.20*    Glucose  Recent Labs Lab 07/14/16 1953 07/14/16 2351 07/15/16 0807 07/15/16 0840 07/15/16 1203 07/15/16 1227  GLUCAP 250* 192* 149* 211* 160* 191*    Imaging No results found.  STUDIES:  CT head 1/8 > Motion degraded exam demonstrating no definite cerebral infarction or hemorrhage. Mild atrophy and small vessel disease, similar to priors. Asymmetric hypodensity of the LEFT mid pons, favored to represent artifact; if signs and symptoms of brainstem infarction are present, recommend further assessment with MR imaging when the patient is better able to remain motionless. MRI brain 1/10 >> large L MCA posterior CVA with associated edema, trace petechial hemorrhage, no mass effect; notable posterior circ stenosis, R anterior circ EEG 1/10 >> no active seizures, global cerebral dysfunction.   CULTURES: Blood  1/8 > 1 of 2 GPC >> Mircrococcus luteus Urine 1/8 > negative Blood 1/11 >>   ANTIBIOTICS: CTX 1/10>>> Zosyn 1/8 >1/10 Vanco 1/8 >> 1/11  SIGNIFICANT EVENTS: 1/8 > ED for 8 day history of AMS 1/9 > Intubated for inability to protect airway  1/10 MRI shows left pos MCA cva  LINES/TUBES: ETT 1/9 >>  1/10 left ij>>>  ASSESSMENT / PLAN:  PULMONARY A: Inability to protect airway secondary to mental status alteration PNA - question aspiration OSA not on CPAP COPD without acute exacerbation  P:   Continue efforts at WUA and PSV. Goal extubate when MS and airway protection deemed adequate.  Scheduled duoneb BD's Follow CXR, pulm hygiene  CARDIOVASCULAR A:  Chronic diastolic CHF, some possible acute component based on CXR HTN Borderline BP P:  Telemetry monitoring Holding home lisinopril, carvedilol for now; restart when BP will tolerate Consider restart clopidogrel > would defer until OK with Neurology given hemorrhage on MRI Brain ASA already ordered PRN hydralazine Follow BP, goal MAP > 65  RENAL A:   AKI on CKD (Base Crt 1.9)  Brain edema P:   No shift but at risk due to brain edema; minimize free water and allow Na to drift up, current 154 follow serial BMP  GASTROINTESTINAL A:   GERD P:   Pepcid daily NPO Continue TF's   HEMATOLOGIC A:   dvt prevention P:  Follow CBC SCD's /Hep sq   INFECTIOUS A:   PNA, suspected aspiration P:   Off Acyclovir  Continue ceftriaxone, day # 4 (abx day # 6) Off vanco and follow; 1 BC w/ Micrococcus   ENDOCRINE A:   Hyperglycemia  DM HHNK > resolved P:   CBG monitoring and SSI Continue Lantus 15 BID  Continue TF coverage 4u regular  NEUROLOGIC A:   Hx [place] CVA  Seizure disorder, no active seizures EEG 1/10 New acute left mca posterior cva P:   RASS goal: 0 to -1 Neuro following  Goal MAP > 65 Avoid fevers Control glu Asa   Home Keppra D/C'd due to Nephrotoxicity - changed to Depakene    FAMILY  - Updates: RB updated wife, sister and granddaughter at bedside  1/12  - Inter-disciplinary family meet or Palliative Care meeting due by:  1/16  Tammy Parrett NP-C  [facility]  [phone]   07/15/2016, 1:22 PM

## 2016-07-15 NOTE — Progress Notes (Addendum)
STROKE TEAM PROGRESS NOTE   SUBJECTIVE (INTERVAL HISTORY) No family is at the bedside.  No more seizure like activity seen since yesterday. Pt still intubated on fentanyl. Eyes open, not following commands and agitated intermittently. Na still high at 154, glucose still high at 200s, on lantus. Afebrile overnight but on cooling blanket. On TF and NS IVF. Has excessive urine output, will check urine gravity.   OBJECTIVE Temp:  [97.7 F (36.5 C)-99.7 F (37.6 C)] 99.1 F (37.3 C) (01/13 0400) Pulse Rate:  [77-102] 77 (01/13 0600) Cardiac Rhythm: Normal sinus rhythm;Sinus tachycardia (01/13 0400) Resp:  [9-18] 16 (01/13 0600) BP: (96-187)/(46-100) 126/59 (01/13 0600) SpO2:  [95 %-100 %] 99 % (01/13 0600) FiO2 (%):  [40 %] 40 % (01/13 0400) Weight:  [131.7 kg (290 lb 5.5 oz)] 131.7 kg (290 lb 5.5 oz) (01/13 0500)  CBC:  Recent Labs Lab 07/10/16 0820  07/14/16 0430 07/15/16 0316  WBC 17.7*  < > 12.6* 15.1*  NEUTROABS 15.1*  --  7.7  --   HGB 16.9  < > 11.3* 11.9*  HCT 46.7  < > 33.5* 35.4*  MCV 71.1*  < > 74.0* 74.7*  PLT 430*  < > 266 284  < > = values in this interval not displayed.  Basic Metabolic Panel:   Recent Labs Lab 07/13/16 0400 07/13/16 1619  07/14/16 1847 07/15/16 0316  NA 147*  --   < > 155* 154*  K 4.1  --   < > 4.4 3.9  CL 120*  --   < > 128* 127*  CO2 19*  --   < > 23 19*  GLUCOSE 286*  --   < > 345* 302*  BUN 42*  --   < > 38* 40*  CREATININE 2.95*  --   < > 2.36* 2.01*  CALCIUM 8.5*  --   < > 8.9 9.0  MG 2.3 2.2  --   --   --   PHOS 3.2 3.4  --   --   --   < > = values in this interval not displayed.  Lipid Panel:     Component Value Date/Time   CHOL 110 07/13/2016 0400   TRIG 152 (H) 07/13/2016 0400   HDL 16 (L) 07/13/2016 0400   CHOLHDL 6.9 07/13/2016 0400   VLDL 30 07/13/2016 0400   LDLCALC 64 07/13/2016 0400   HgbA1c:  Lab Results  Component Value Date   HGBA1C 12.3 (H) 07/10/2016   Urine Drug Screen:     Component Value Date/Time    LABOPIA NONE DETECTED 07/10/2016 2239   COCAINSCRNUR NONE DETECTED 07/10/2016 2239   LABBENZ NONE DETECTED 07/10/2016 2239   AMPHETMU NONE DETECTED 07/10/2016 2239   THCU NONE DETECTED 07/10/2016 2239   LABBARB NONE DETECTED 07/10/2016 2239      IMAGING  I have personally reviewed the radiological images below and agree with the radiology interpretations.  Ct Head Wo Contrast 07/10/2016 Motion degraded exam demonstrating no definite cerebral infarction or hemorrhage. Mild atrophy and small vessel disease, similar to priors. Asymmetric hypodensity of the LEFT mid pons, favored to represent artifact; if signs and symptoms of brainstem infarction are present, recommend further assessment with MR imaging when the patient is better able to remain motionless.   Mr Brain 28 Contrast Mr Maxine Glenn Head Wo Contrast 07/12/2016 1. Large posterior left MCA territory infarct with cytotoxic edema and trace petechial hemorrhage but no intracranial mass effect at this time.  2. No associated large  vessel or left MCA branch occlusion.  3. Intracranial MRA is positive for moderate to severe posterior circulation atherosclerosis and stenosis (involving both distal vertebral arteries and the proximal basilar artery), with mild to moderate atherosclerosis and stenosis in the right anterior circulation (right supraclinoid ICA and right MCA bifurcation).   EEG This sedated EEG is abnormal due to moderate low voltage diffuse slowing of the background.  CUS - Carotid duplex attempted, limited exam due to IV dressing and patient positioning.  Findings consistent with a 60 - 79 percent stenosis involving the proximal right internal carotid artery. Images and Doppler evaluation could be underestimated due to suboptimal scanning conditions. Left carotid arteries not imaged due to IV dressing.  TTE - Left ventricle: The cavity size was normal. Wall thickness was   normal. Systolic function was vigorous. The estimated  ejection   fraction was in the range of 65% to 70%. Wall motion was normal;   there were no regional wall motion abnormalities. Doppler   parameters are consistent with abnormal left ventricular   relaxation (grade 1 diastolic dysfunction). - Aortic valve: Discrete hyperechoic noule seen on the right   coronary cusp is likely due to degenerative changes, but cannot   exclude small papillary fibroelastoma. Consider TEE.  LE venous Doppler pending  PHYSICAL EXAM  Obese middle aged african [place] male who is intubated and sedated on fetanyl. Afebrile. Head is nontraumatic. Neck is supple. Cardiac exam mild tachycardia, normal rhythm. Distal pulses are well felt. Neurological Exam  Intubated on sedation, eyes open, but not blinking and agitated with restless. Does not follow commands. Eyes middle position, not blinking to visual threat. Minimal doll's eyes elicited. PERRL, small pupil size, positive corneal and gag. Tongue is midline in mouth. Motor system exam purposeful antigravity movements on left UE. LLE minimal withdraw on pain. However, right hemiplegia on pain stimulation. B/l positive babinski, DTR diminished. Sensation, coordination and gait not tested.    ASSESSMENT/PLAN [name]. is a 66 y.o. male with history of uncontrolled Type II DM, Stage III CKD, HTN, HLD, seizure disorder, COPD, CVA, and chronic gout presenting with 8 day history of AMS, right sided weakness, and not communicating x 2 days. He did not receive IV t-PA due to delay in arrival.   Stroke:  Left MCA posterior division embolic infarct with cytotoxic edema secondary to unclear source  Resultant  Intubated, aphasia, R hemiparesis, R facial droop  Code Stroke CT concerning for L mid pontine infarct but MRI no pontine infarct  MRI  posterior division left MCA infarct  MRA right M2, BA, b/l VA stenosis/athero  Carotid Doppler right ICA 60-79% stenosis and left ICA not able to see well  Will do CTA neck  once Cre improved.   2D Echo EF 65% to 70%. But there is AV nodule - need TEE for further eval once stable  LDL 64   HgbA1c - 12.3 -> diabetic coordinator consulted  Heparin subq for VTE prophylaxis Diet NPO time specified, on TF @ 40  clopidogrel 75 mg daily and ASA prior to admission, now on aspirin per tube for secondary stroke prevention. May consider DAPT later if pt stable no more procedure planned.   Ongoing aggressive stroke risk factor management  Therapy recommendations:  pending   Disposition:  pending   Acute respiratory Failure  Intubated for respiratory failure  CCM on board  On CPAP now  Encephalopathy   Metabolic etiology  Related to DKA, fever, CHF, hypernatremia, leukocytosis, hyperammonemia  Treat underline disease  CCM on board  Seizure Disorder  Hx seizures  On Keppra PTA  No observed seizure activity  EEG - low voltage, slowing, no sz  Given renal failure, d/c Keppra and started depacon 750 q 8  depakote level 116 -> 75 (today)  Hyperammonemia  Ammonia level 30->70->29  Continue observe  Fever with leukocystosis  Currently afebrile  Still has cooling blanket  On rocephine  LP negative for meningitis  Culture so far NGTD  WBC 27.7->18.7->12.6->15.1  Hypernatremia from hyponatremia  Na 120-135-142-147-154-155-154  On NS @ 50 and TF @ 40  Excessive urine outpt  Check urine gravity, Na and Cre  CCM on board  DKA Diabetes, uncontrolled Diabetic Neuropathy  Glucose levels still elevated  HgbA1c 12.3 , goal < 7.0  Uncontrolled  Diabetes RN following  SSI  On lantus  May consider insulin drip if 3 consecutive glucose > 180  Hypotension Hx Hypertension  Variable with Low BP  Propofol changed to fentanyl  From stroke standpoint, gradually normalize in 5-7 days  Long-term BP goal normotensive  Hyperlipidemia  Home meds:  lipitor 80  Resume statin when able in hospital  LDL 64, goal <  70  Continue statin at discharge  Other Stroke Risk Factors  Advanced age  Obesity, Body mass index is 37.28 kg/m., recommend weight loss, diet and exercise as appropriate   Hx stroke/TIA - Details not available  Coronary artery disease  Obstructive sleep apnea, on CPAP at home  CHF - on admission CXR  Other Active Problems  Acute on chronic renal failure - 3.92-2.95-2.36-2.01  hypercalcemia  COPD  Chronic gout  Chronic back pain and sciatica  BPH  Hospital day # 5   This patient is critically ill due to left MCA infarct, intubation, fever, sepsis, DKA, encephalopathy, seizure and at significant risk of neurological worsening, death form recurrent stroke, hemorrhagic transformation, DKA, heart failure, status epilepticus. This patient's care requires constant monitoring of vital signs, hemodynamics, respiratory and cardiac monitoring, review of multiple databases, neurological assessment, discussion with family, other specialists and medical decision making of high complexity. I spent 40 minutes of neurocritical care time in the care of this patient.  Marvel PlanJindong Kolbe Delmonaco, MD PhD Stroke Neurology 07/15/2016 9:51 AM    To contact Stroke Continuity provider, please refer to [email or web address]. After hours, contact General Neurology

## 2016-07-16 ENCOUNTER — Inpatient Hospital Stay (HOSPITAL_COMMUNITY): Payer: Medicare PPO

## 2016-07-16 DIAGNOSIS — E785 Hyperlipidemia, unspecified: Secondary | ICD-10-CM

## 2016-07-16 DIAGNOSIS — M7989 Other specified soft tissue disorders: Secondary | ICD-10-CM

## 2016-07-16 DIAGNOSIS — I63412 Cerebral infarction due to embolism of left middle cerebral artery: Secondary | ICD-10-CM

## 2016-07-16 DIAGNOSIS — E1159 Type 2 diabetes mellitus with other circulatory complications: Secondary | ICD-10-CM

## 2016-07-16 DIAGNOSIS — Z794 Long term (current) use of insulin: Secondary | ICD-10-CM

## 2016-07-16 LAB — BASIC METABOLIC PANEL
Anion gap: 10 (ref 5–15)
BUN: 37 mg/dL — ABNORMAL HIGH (ref 6–20)
BUN: 40 mg/dL — ABNORMAL HIGH (ref 6–20)
CHLORIDE: 129 mmol/L — AB (ref 101–111)
CO2: 21 mmol/L — AB (ref 22–32)
CO2: 22 mmol/L (ref 22–32)
CREATININE: 1.94 mg/dL — AB (ref 0.61–1.24)
Calcium: 8.9 mg/dL (ref 8.9–10.3)
Calcium: 8.9 mg/dL (ref 8.9–10.3)
Creatinine, Ser: 2.03 mg/dL — ABNORMAL HIGH (ref 0.61–1.24)
GFR calc non Af Amer: 35 mL/min — ABNORMAL LOW (ref >60)
GFR calc non Af Amer: 33 mL/min — ABNORMAL LOW (ref >60)
GFR, EST AFRICAN AMERICAN: 40 mL/min — AB (ref >60)
GFR, EST AFRICAN AMERICAN: 38 mL/min — AB (ref >60)
GLUCOSE: 145 mg/dL — AB (ref 65–99)
Glucose, Bld: 165 mg/dL — ABNORMAL HIGH (ref 65–99)
POTASSIUM: 4.1 mmol/L (ref 3.5–5.1)
Potassium: 3.8 mmol/L (ref 3.5–5.1)
SODIUM: 161 mmol/L — AB (ref 135–145)
Sodium: 160 mmol/L — ABNORMAL HIGH (ref 135–145)

## 2016-07-16 LAB — GLUCOSE, CAPILLARY
GLUCOSE-CAPILLARY: 192 mg/dL — AB (ref 65–99)
GLUCOSE-CAPILLARY: 139 mg/dL — AB (ref 65–99)
Glucose-Capillary: 127 mg/dL — ABNORMAL HIGH (ref 65–99)
Glucose-Capillary: 130 mg/dL — ABNORMAL HIGH (ref 65–99)
Glucose-Capillary: 167 mg/dL — ABNORMAL HIGH (ref 65–99)
Glucose-Capillary: 328 mg/dL — ABNORMAL HIGH (ref 65–99)

## 2016-07-16 LAB — VAS US CAROTID
Left CCA dist dias: -42 cm/s
Left CCA dist sys: -175 cm/s
Left CCA prox dias: 29 cm/s
Left CCA prox sys: 177 cm/s
RCCADSYS: -122 cm/s
RCCAPDIAS: 27 cm/s
RIGHT CCA MID DIAS: 29 cm/s
RIGHT ECA DIAS: -15 cm/s
RIGHT VERTEBRAL DIAS: -17 cm/s
Right CCA prox sys: 224 cm/s

## 2016-07-16 LAB — CBC
HCT: 33 % — ABNORMAL LOW (ref 39.0–52.0)
HEMOGLOBIN: 11 g/dL — AB (ref 13.0–17.0)
MCH: 25.6 pg — AB (ref 26.0–34.0)
MCHC: 33.3 g/dL (ref 30.0–36.0)
MCV: 76.7 fL — AB (ref 78.0–100.0)
PLATELETS: 274 10*3/uL (ref 150–400)
RBC: 4.3 MIL/uL (ref 4.22–5.81)
RDW: 17.8 % — ABNORMAL HIGH (ref 11.5–15.5)
WBC: 11.7 10*3/uL — ABNORMAL HIGH (ref 4.0–10.5)

## 2016-07-16 LAB — VALPROIC ACID LEVEL: Valproic Acid Lvl: 74 ug/mL (ref 50.0–100.0)

## 2016-07-16 MED ORDER — FREE WATER
200.0000 mL | Status: DC
  Administered 2016-07-16 – 2016-07-19 (×16): 200 mL

## 2016-07-16 MED ORDER — FREE WATER
150.0000 mL | Freq: Three times a day (TID) | Status: DC
  Administered 2016-07-16: 150 mL

## 2016-07-16 NOTE — Progress Notes (Signed)
STROKE TEAM PROGRESS NOTE   SUBJECTIVE (INTERVAL HISTORY) Wife and daughter are at the bedside.  No more seizure like activit. Pt still intubated on fentanyl. Eyes open spontaneously but not following commands and agitated intermittently. Na still high at 160, glucose better controlled with SSI and lantus. Afebrile overnight.   OBJECTIVE Temp:  [98.3 F (36.8 C)-98.8 F (37.1 C)] 98.3 F (36.8 C) (01/14 0430) Pulse Rate:  [74-104] 82 (01/14 0800) Cardiac Rhythm: Normal sinus rhythm (01/13 2200) Resp:  [3-31] 16 (01/14 0800) BP: (97-149)/(46-83) 135/74 (01/14 0800) SpO2:  [98 %-100 %] 100 % (01/14 0832) FiO2 (%):  [40 %] 40 % (01/14 0832) Weight:  [125.5 kg (276 lb 10.8 oz)] 125.5 kg (276 lb 10.8 oz) (01/14 0400)  CBC:  Recent Labs Lab 07/10/16 0820  07/14/16 0430 07/15/16 0316 07/16/16 0430  WBC 17.7*  < > 12.6* 15.1* 11.7*  NEUTROABS 15.1*  --  7.7  --   --   HGB 16.9  < > 11.3* 11.9* 11.0*  HCT 46.7  < > 33.5* 35.4* 33.0*  MCV 71.1*  < > 74.0* 74.7* 76.7*  PLT 430*  < > 266 284 274  < > = values in this interval not displayed.  Basic Metabolic Panel:   Recent Labs Lab 07/13/16 0400 07/13/16 1619  07/15/16 0316 07/16/16 0430  NA 147*  --   < > 154* 160*  K 4.1  --   < > 3.9 3.8  CL 120*  --   < > 127* 129*  CO2 19*  --   < > 19* 21*  GLUCOSE 286*  --   < > 302* 145*  BUN 42*  --   < > 40* 37*  CREATININE 2.95*  --   < > 2.01* 1.94*  CALCIUM 8.5*  --   < > 9.0 8.9  MG 2.3 2.2  --   --   --   PHOS 3.2 3.4  --   --   --   < > = values in this interval not displayed.  Lipid Panel:     Component Value Date/Time   CHOL 110 07/13/2016 0400   TRIG 152 (H) 07/13/2016 0400   HDL 16 (L) 07/13/2016 0400   CHOLHDL 6.9 07/13/2016 0400   VLDL 30 07/13/2016 0400   LDLCALC 64 07/13/2016 0400   HgbA1c:  Lab Results  Component Value Date   HGBA1C 12.3 (H) 07/10/2016   Urine Drug Screen:     Component Value Date/Time   LABOPIA NONE DETECTED 07/10/2016 2239    COCAINSCRNUR NONE DETECTED 07/10/2016 2239   LABBENZ NONE DETECTED 07/10/2016 2239   AMPHETMU NONE DETECTED 07/10/2016 2239   THCU NONE DETECTED 07/10/2016 2239   LABBARB NONE DETECTED 07/10/2016 2239      IMAGING  I have personally reviewed the radiological images below and agree with the radiology interpretations.  Ct Head Wo Contrast 07/10/2016 Motion degraded exam demonstrating no definite cerebral infarction or hemorrhage. Mild atrophy and small vessel disease, similar to priors. Asymmetric hypodensity of the LEFT mid pons, favored to represent artifact; if signs and symptoms of brainstem infarction are present, recommend further assessment with MR imaging when the patient is better able to remain motionless.   Mr Brain 61 Contrast Mr Maxine Glenn Head Wo Contrast 07/12/2016 1. Large posterior left MCA territory infarct with cytotoxic edema and trace petechial hemorrhage but no intracranial mass effect at this time.  2. No associated large vessel or left MCA branch occlusion.  3.  Intracranial MRA is positive for moderate to severe posterior circulation atherosclerosis and stenosis (involving both distal vertebral arteries and the proximal basilar artery), with mild to moderate atherosclerosis and stenosis in the right anterior circulation (right supraclinoid ICA and right MCA bifurcation).   EEG This sedated EEG is abnormal due to moderate low voltage diffuse slowing of the background.  CUS - Carotid duplex attempted, limited exam due to IV dressing and patient positioning.  Findings consistent with a 60 - 79 percent stenosis involving the proximal right internal carotid artery. Images and Doppler evaluation could be underestimated due to suboptimal scanning conditions. Left carotid arteries not imaged due to IV dressing.  TTE - Left ventricle: The cavity size was normal. Wall thickness was   normal. Systolic function was vigorous. The estimated ejection   fraction was in the range of 65% to  70%. Wall motion was normal;   there were no regional wall motion abnormalities. Doppler   parameters are consistent with abnormal left ventricular   relaxation (grade 1 diastolic dysfunction). - Aortic valve: Discrete hyperechoic nodule seen on the right   coronary cusp is likely due to degenerative changes, but cannot   exclude small papillary fibroelastoma. Consider TEE.  LE and UE venous Doppler pending   PHYSICAL EXAM  Obese middle aged african [place] male who is intubated and sedated on fetanyl. Afebrile. Head is nontraumatic. Neck is supple. Cardiac exam mild tachycardia, normal rhythm. Distal pulses are well felt. Neurological Exam  Intubated on sedation, eyes open, but not blinking and agitated with restless. Does not follow commands. Eyes right gaze position, not blinking to visual threat. Minimal doll's eyes elicited. Pinpoint pupil size with sluggish reaction to light, positive corneal and gag. Tongue is midline in mouth. Motor system exam purposeful antigravity movements on left UE. LLE mild withdraw on pain 2/5. However, right hemiplegia on pain stimulation. B/l positive babinski, DTR diminished. Sensation, coordination and gait not tested.    ASSESSMENT/PLAN Mr. Chan Sheahan. is a 66 y.o. male with history of uncontrolled Type II DM, Stage III CKD, HTN, HLD, seizure disorder, COPD, CVA, and chronic gout presenting with 8 day history of AMS, right sided weakness, and not communicating x 2 days. He did not receive IV t-PA due to delay in arrival.   Stroke:  Left MCA posterior division embolic infarct with cytotoxic edema secondary to unclear source  Resultant  Intubated, aphasia, R hemiparesis, R facial droop  Code Stroke CT concerning for L mid pontine infarct but MRI no pontine infarct  MRI  posterior division left MCA infarct  MRA right M2, BA, b/l VA stenosis/athero  Carotid Doppler - right ICA 60-79% stenosis and left ICA not able to see well  Will do CTA neck  once Cre improved.   2D Echo EF 65% to 70%. But there is AV nodule - need TEE for further eval once stable  LDL 64   HgbA1c - 12.3 -> diabetic coordinator consulted  Heparin subq for VTE prophylaxis Diet NPO time specified, on TF @ 40  clopidogrel 75 mg daily and ASA prior to admission, now on aspirin per tube for secondary stroke prevention. May consider DAPT later if pt stable no more procedure planned.   Ongoing aggressive stroke risk factor management  Therapy recommendations:  pending   Disposition:  pending   Acute respiratory Failure  Intubated for respiratory failure  CCM on board  On CPAP now  Encephalopathy   Metabolic etiology  Related to DKA, fever, CHF, hypernatremia,  leukocytosis, hyperammonemia  Treat underlying disease  CCM on board  Seizure Disorder  Hx seizures  On Keppra PTA  No observed seizure activity  EEG - low voltage, slowing, no sz  Given renal failure, d/c Keppra and started depacon 750 q 8  Depakote level 116 -> 75 -> 74  Hyperammonemia  Ammonia level 30->70->29  Continue observe  Fever with leukocystosis  Currently afebrile  Still has cooling blanket  Rocephine - started 1/10 2018  LP negative for meningitis  Culture so far NGTD  WBC 27.7->18.7->12.6->15.1 -> 11.7  Hypernatremia from hyponatremia  Na 120-135-142-147-154-155-154 -> 160  On TF @ 40  CCM on board  May consider free water  DKA Diabetes, uncontrolled Diabetic Neuropathy  Glucose levels still elevated  HgbA1c 12.3 , goal < 7.0  Uncontrolled  Diabetes RN following  SSI  On lantus  Hypotension Hx Hypertension  Variable with Low BP  Propofol changed to fentanyl  From stroke standpoint, gradually normalize in 5-7 days  Long-term BP goal normotensive  Hyperlipidemia  Home meds:  lipitor 80  Resume statin when able in hospital  LDL 64, goal < 70  Continue statin at discharge  Other Stroke Risk Factors  Advanced  age  Obesity, Body mass index is 35.52 kg/m., recommend weight loss, diet and exercise as appropriate   Hx stroke/TIA - Details not available  Coronary artery disease  Obstructive sleep apnea, on CPAP at home  CHF - on admission CXR  Other Active Problems  Acute on chronic renal failure - 3.92-2.95-2.36-2.01 -> 1.94  hypercalcemia  COPD  Chronic gout  Chronic back pain and sciatica  BPH  Hospital day # 6   This patient is critically ill due to left MCA infarct, intubation, fever, sepsis, DKA, encephalopathy, seizure and at significant risk of neurological worsening, death form recurrent stroke, hemorrhagic transformation, DKA, heart failure, status epilepticus. This patient's care requires constant monitoring of vital signs, hemodynamics, respiratory and cardiac monitoring, review of multiple databases, neurological assessment, discussion with family, other specialists and medical decision making of high complexity. I spent 40 minutes of neurocritical care time in the care of this patient.  Marvel PlanJindong Ellieana Dolecki, MD PhD Stroke Neurology 07/16/2016 11:25 AM    To contact Stroke Continuity provider, please refer to [email or web address]. After hours, contact General Neurology

## 2016-07-16 NOTE — Progress Notes (Signed)
PULMONARY / CRITICAL CARE MEDICINE   Name: [name]. MRN: [id number] DOB: December 02, 1950    ADMISSION DATE:  07/10/2016 CONSULTATION DATE:  07/11/2016  REFERRING MD:  Holland Falling  CHIEF COMPLAINT:  AMS  Brief:   65 year old male with PMH as below, which is significant for COPD, DM, Hepatitis, HTN, Seizures, OSA, and cardiomyopathy. He was admitted 1/8 by FPTS for HHNK, PNA, and possible CVA after presenting with 8 days AMS and eventually was found sitting outside in the freezing temperature in his underwear. Since time of admission his mentation continued to worsen and he was placed on BiPAP.   SUBJECTIVE:  Weaned well 1/13 most of day. Failed wean this am, increased wob and agitation   Na tr up .  Afebrile on cooling blanket  BS trending down   VITAL SIGNS: BP 135/74   Pulse 82   Temp 98.3 F (36.8 C) (Oral)   Resp 16   Ht 6\' 2"  (1.88 m)   Wt 125.5 kg (276 lb 10.8 oz)   SpO2 100%   BMI 35.52 kg/m   HEMODYNAMICS:    VENTILATOR SETTINGS: Vent Mode: PSV;CPAP FiO2 (%):  [40 %] 40 % Set Rate:  [16 bmp] 16 bmp Vt Set:  [650 mL] 650 mL PEEP:  [5 cmH20] 5 cmH20 Pressure Support:  [10 cmH20] 10 cmH20 Plateau Pressure:  [17 cmH20-22 cmH20] 22 cmH20  INTAKE / OUTPUT: I/O last 3 completed shifts: In: 4971.9 [I.V.:2346.9; NG/GT:2260; IV Piggyback:365] Out: 4690 [Urine:4290; Stool:400]  PHYSICAL EXAMINATION: General:  Obese man, ventilated and sedated,  Neuro: opens eyes to voice , not following commands.  ,intermittent agitation  HEENT: ETT in place, pupils sluggish Cardiovascular:  RRR, distant, no M heard Lungs: coarse BS bilaterally, decreased at bases, good cough Abdomen: obese, protuberant,  + BS Musculoskeletal:  No edema, deformity Skin: no rash, no sores reported.   LABS:  BMET  Recent Labs Lab 07/14/16 1847 07/15/16 0316 07/16/16 0430  NA 155* 154* 160*  K 4.4 3.9 3.8  CL 128* 127* 129*  CO2 23 19* 21*  BUN 38* 40* 37*  CREATININE 2.36* 2.01*  1.94*  GLUCOSE 345* 302* 145*    Electrolytes  Recent Labs Lab 07/12/16 1638 07/13/16 0400 07/13/16 1619  07/14/16 1847 07/15/16 0316 07/16/16 0430  CALCIUM  --  8.5*  --   < > 8.9 9.0 8.9  MG 2.4 2.3 2.2  --   --   --   --   PHOS 3.2 3.2 3.4  --   --   --   --   < > = values in this interval not displayed.  CBC  Recent Labs Lab 07/14/16 0430 07/15/16 0316 07/16/16 0430  WBC 12.6* 15.1* 11.7*  HGB 11.3* 11.9* 11.0*  HCT 33.5* 35.4* 33.0*  PLT 266 284 274    Coag's  Recent Labs Lab 07/10/16 0854  INR 1.19    Sepsis Markers  Recent Labs Lab 07/10/16 1440 07/10/16 1755 07/11/16 0425 07/12/16 0212 07/13/16 0400 07/14/16 0430  LATICACIDVEN 3.6* 2.9* 1.7  --   --   --   PROCALCITON  --   --   --  0.36 0.46 0.37    ABG  Recent Labs Lab 07/11/16 1142 07/11/16 2004 07/12/16 0030  PHART 7.288* 7.329* 7.365  PCO2ART 40.1 36.3 32.5  PO2ART 123.0* 100 196*    Liver Enzymes  Recent Labs Lab 07/10/16 0820  AST 49*  ALT 32  ALKPHOS 165*  BILITOT 1.5*  ALBUMIN 4.6    Cardiac Enzymes  Recent Labs Lab 07/10/16 2111 07/11/16 0137 07/11/16 0425  TROPONINI 0.19* 0.20* 0.20*    Glucose  Recent Labs Lab 07/15/16 1203 07/15/16 1227 07/15/16 1607 07/15/16 1939 07/15/16 2312 07/16/16 0430  GLUCAP 160* 191* 135* 167* 210* 130*    Imaging Dg Chest Port 1 View  Result Date: 07/16/2016 CLINICAL DATA:  Respiratory failure. EXAM: PORTABLE CHEST 1 VIEW COMPARISON:  07/14/2016. FINDINGS: Endotracheal tube in satisfactory position. Nasogastric tube extending into the stomach. Left jugular catheter tip in the proximal superior vena cava. Stable enlarged cardiac silhouette and left basilar airspace opacity. Resolved airspace opacity at the right lung base. Unremarkable bones. IMPRESSION: 1. Stable left basilar pneumonia or patchy atelectasis. 2. Resolved right basilar atelectasis. Electronically Signed   By: Beckie Salts M.D.   On: 07/16/2016 07:30     STUDIES:  CT head 1/8 > Motion degraded exam demonstrating no definite cerebral infarction or hemorrhage. Mild atrophy and small vessel disease, similar to priors. Asymmetric hypodensity of the LEFT mid pons, favored to represent artifact; if signs and symptoms of brainstem infarction are present, recommend further assessment with MR imaging when the patient is better able to remain motionless. MRI brain 1/10 >> large L MCA posterior CVA with associated edema, trace petechial hemorrhage, no mass effect; notable posterior circ stenosis, R anterior circ EEG 1/10 >> no active seizures, global cerebral dysfunction.   CULTURES: Blood 1/8 > 1 of 2 GPC >> Mircrococcus luteus Urine 1/8 > negative Blood 1/11 >>   ANTIBIOTICS: CTX 1/10>>> Zosyn 1/8 >1/10 Vanco 1/8 >> 1/11  SIGNIFICANT EVENTS: 1/8 > ED for 8 day history of AMS 1/9 > Intubated for inability to protect airway  1/10 MRI shows left pos MCA cva  LINES/TUBES: ETT 1/9 >>  1/10 left ij>>>  ASSESSMENT / PLAN:  PULMONARY A: Inability to protect airway secondary to mental status alteration PNA - question aspiration OSA not on CPAP COPD without acute exacerbation 1/14 CXR resolved right atx/pna, persistent LLL /?effusion  P:   Continue efforts at WUA and PSV. Goal extubate when MS and airway protection deemed adequate.  Scheduled duoneb BD's Follow CXR, pulm hygiene  CARDIOVASCULAR A:  Chronic diastolic CHF,   possible acute component   HTN-b/p borderline  1/14 +11L I/O since admission    P:  Telemetry monitoring Holding home lisinopril, carvedilol for now; restart when BP will tolerate Consider restart clopidogrel > would defer until OK with Neurology given hemorrhage on MRI Brain ASA already ordered PRN hydralazine Follow BP, goal MAP > 65 D/c NS IVF (Na tr up /+ I/O bal   RENAL A:   AKI on CKD-Stage III  (Base Crt 1.9)  Brain edema Hypernatremia Na tr up 154>155>154>160  P:   D/c IV NS .  Check bmet in  am   GASTROINTESTINAL A:   GERD Hx of Hep C  Hyperammonemia -tr down 70>20 P:   Pepcid daily NPO Continue TF's  Check LFT in am   HEMATOLOGIC A:   dvt prevention P:  Follow CBC SCD's /Hep sq   INFECTIOUS A:   PNA, suspected aspiration P:   Off Acyclovir  Continue ceftriaxone, day # 5 (abx day # 7) Off vanco and follow; 1 BC w/ Micrococcus  Follow cx data  ENDOCRINE A:   Hyperglycemia  DM HHNK > resolved 1/14 BS tr down  P:   CBG monitoring and SSI Continue Lantus 15 BID  Continue TF coverage 4u regular  NEUROLOGIC A:   Hx Pontine CVA  Seizure disorder, no active seizures EEG 1/10 New acute left mca posterior cva Hyperamonemia >Ammonia tr down 70>20 P:   RASS goal: 0 to -1 Neuro following  Goal MAP > 65 Avoid fevers Control glu Asa   Home Keppra D/C'd due to Nephrotoxicity - changed to Depakene   FAMILY  - Updates: RB updated wife, sister and granddaughter at bedside  1/12  - Inter-disciplinary family meet or Palliative Care meeting due by:  1/16  Manjot Beumer NP-C  [facility]  [phone]   07/16/2016, 8:52 AM

## 2016-07-16 NOTE — Progress Notes (Signed)
**  Preliminary report by tech**  Bilateral lower extremity venous duplex complete. There is no evidence of deep or superficial vein thrombosis involving the right and left lower extremities. All visualized vessels appear patent and compressible. There is no evidence of Baker's cysts bilaterally.   Right upper extremity venous duplex complete. There is no obvious evidence of deep or superficial vein thrombosis involving the right upper extremity. All clearly visualized vessels appear patent and compressible.   07/16/16 3:47 PM Olen CordialGreg Katarzyna Wolven RVT

## 2016-07-16 NOTE — Treatment Plan (Signed)
Spoke with Dr Roda ShuttersXu.  He states that pt may resume Plavix if no invasive procedures are planned for pt.

## 2016-07-16 NOTE — Progress Notes (Signed)
CRITICAL VALUE ALERT  Critical value received:  Na of 161, chloride >130  Date of notification:  07/16/2016  Time of notification:  2030  Critical value read back:  yes  Nurse who received alert:  Delanna AhmadiJenifer Myalynn Lingle RN  MD notified (1st page):  elink Renae Fickle(Paul)  Time of first page:  2030 called elink

## 2016-07-16 NOTE — Progress Notes (Signed)
eLink Physician-Brief Progress Note Patient Name: Ronald FordLouis Uhlir Jr. DOB: 08-20-1950 MRN: [id number]   Date of Service  07/16/2016  HPI/Events of Note  Hypernatremia - Na+ = 161.  eICU Interventions  Will order: 1. Increase Free Water via tube to 200 mL Q 4 hours.      Intervention Category Major Interventions: Electrolyte abnormality - evaluation and management  Sommer,Steven Eugene 07/16/2016, 8:42 PM

## 2016-07-17 ENCOUNTER — Inpatient Hospital Stay (HOSPITAL_COMMUNITY): Payer: Medicare PPO

## 2016-07-17 DIAGNOSIS — E7220 Disorder of urea cycle metabolism, unspecified: Secondary | ICD-10-CM

## 2016-07-17 DIAGNOSIS — I6340 Cerebral infarction due to embolism of unspecified cerebral artery: Secondary | ICD-10-CM

## 2016-07-17 DIAGNOSIS — G9340 Encephalopathy, unspecified: Secondary | ICD-10-CM

## 2016-07-17 DIAGNOSIS — R7309 Other abnormal glucose: Secondary | ICD-10-CM

## 2016-07-17 DIAGNOSIS — E1310 Other specified diabetes mellitus with ketoacidosis without coma: Secondary | ICD-10-CM

## 2016-07-17 DIAGNOSIS — E1110 Type 2 diabetes mellitus with ketoacidosis without coma: Secondary | ICD-10-CM

## 2016-07-17 DIAGNOSIS — E870 Hyperosmolality and hypernatremia: Secondary | ICD-10-CM

## 2016-07-17 DIAGNOSIS — J9601 Acute respiratory failure with hypoxia: Secondary | ICD-10-CM

## 2016-07-17 LAB — COMPREHENSIVE METABOLIC PANEL
ALT: 20 U/L (ref 17–63)
AST: 31 U/L (ref 15–41)
Albumin: 2.1 g/dL — ABNORMAL LOW (ref 3.5–5.0)
Alkaline Phosphatase: 53 U/L (ref 38–126)
BUN: 41 mg/dL — AB (ref 6–20)
CO2: 22 mmol/L (ref 22–32)
CREATININE: 1.98 mg/dL — AB (ref 0.61–1.24)
Calcium: 8.9 mg/dL (ref 8.9–10.3)
GFR calc Af Amer: 39 mL/min — ABNORMAL LOW (ref >60)
GFR calc non Af Amer: 34 mL/min — ABNORMAL LOW (ref >60)
Glucose, Bld: 136 mg/dL — ABNORMAL HIGH (ref 65–99)
Potassium: 4 mmol/L (ref 3.5–5.1)
Sodium: 163 mmol/L (ref 135–145)
Total Bilirubin: 0.3 mg/dL (ref 0.3–1.2)
Total Protein: 5.7 g/dL — ABNORMAL LOW (ref 6.5–8.1)

## 2016-07-17 LAB — VALPROIC ACID LEVEL: Valproic Acid Lvl: 74 ug/mL (ref 50.0–100.0)

## 2016-07-17 LAB — BASIC METABOLIC PANEL
Anion gap: 8 (ref 5–15)
Anion gap: 8 (ref 5–15)
BUN: 40 mg/dL — ABNORMAL HIGH (ref 6–20)
BUN: 41 mg/dL — ABNORMAL HIGH (ref 6–20)
BUN: 42 mg/dL — ABNORMAL HIGH (ref 6–20)
BUN: 41 mg/dL — ABNORMAL HIGH (ref 6–20)
CALCIUM: 8.5 mg/dL — AB (ref 8.9–10.3)
CHLORIDE: 129 mmol/L — AB (ref 101–111)
CO2: 21 mmol/L — ABNORMAL LOW (ref 22–32)
CO2: 22 mmol/L (ref 22–32)
CO2: 21 mmol/L — ABNORMAL LOW (ref 22–32)
CO2: 21 mmol/L — ABNORMAL LOW (ref 22–32)
CREATININE: 2.02 mg/dL — AB (ref 0.61–1.24)
CREATININE: 2.09 mg/dL — AB (ref 0.61–1.24)
CREATININE: 2.13 mg/dL — AB (ref 0.61–1.24)
CREATININE: 2.15 mg/dL — AB (ref 0.61–1.24)
Calcium: 8.6 mg/dL — ABNORMAL LOW (ref 8.9–10.3)
Calcium: 8.8 mg/dL — ABNORMAL LOW (ref 8.9–10.3)
Calcium: 8.6 mg/dL — ABNORMAL LOW (ref 8.9–10.3)
Chloride: 127 mmol/L — ABNORMAL HIGH (ref 101–111)
GFR calc Af Amer: 36 mL/min — ABNORMAL LOW (ref >60)
GFR, EST AFRICAN AMERICAN: 38 mL/min — AB (ref >60)
GFR, EST AFRICAN AMERICAN: 37 mL/min — AB (ref >60)
GFR, EST AFRICAN AMERICAN: 35 mL/min — AB (ref >60)
GFR, EST NON AFRICAN AMERICAN: 33 mL/min — AB (ref >60)
GFR, EST NON AFRICAN AMERICAN: 32 mL/min — AB (ref >60)
GFR, EST NON AFRICAN AMERICAN: 31 mL/min — AB (ref >60)
GFR, EST NON AFRICAN AMERICAN: 31 mL/min — AB (ref >60)
Glucose, Bld: 172 mg/dL — ABNORMAL HIGH (ref 65–99)
Glucose, Bld: 155 mg/dL — ABNORMAL HIGH (ref 65–99)
Glucose, Bld: 218 mg/dL — ABNORMAL HIGH (ref 65–99)
Glucose, Bld: 241 mg/dL — ABNORMAL HIGH (ref 65–99)
POTASSIUM: 3.7 mmol/L (ref 3.5–5.1)
Potassium: 3.9 mmol/L (ref 3.5–5.1)
Potassium: 3.9 mmol/L (ref 3.5–5.1)
Potassium: 4 mmol/L (ref 3.5–5.1)
SODIUM: 158 mmol/L — AB (ref 135–145)
SODIUM: 160 mmol/L — AB (ref 135–145)
SODIUM: 158 mmol/L — AB (ref 135–145)
SODIUM: 156 mmol/L — AB (ref 135–145)

## 2016-07-17 LAB — CBC
HEMATOCRIT: 31.9 % — AB (ref 39.0–52.0)
HEMOGLOBIN: 10.4 g/dL — AB (ref 13.0–17.0)
MCH: 25.1 pg — AB (ref 26.0–34.0)
MCHC: 32.6 g/dL (ref 30.0–36.0)
MCV: 77.1 fL — ABNORMAL LOW (ref 78.0–100.0)
Platelets: 294 10*3/uL (ref 150–400)
RBC: 4.14 MIL/uL — AB (ref 4.22–5.81)
RDW: 18.1 % — ABNORMAL HIGH (ref 11.5–15.5)
WBC: 9.4 10*3/uL (ref 4.0–10.5)

## 2016-07-17 LAB — GLUCOSE, CAPILLARY
GLUCOSE-CAPILLARY: 122 mg/dL — AB (ref 65–99)
GLUCOSE-CAPILLARY: 280 mg/dL — AB (ref 65–99)
GLUCOSE-CAPILLARY: 166 mg/dL — AB (ref 65–99)
Glucose-Capillary: 157 mg/dL — ABNORMAL HIGH (ref 65–99)
Glucose-Capillary: 120 mg/dL — ABNORMAL HIGH (ref 65–99)
Glucose-Capillary: 189 mg/dL — ABNORMAL HIGH (ref 65–99)

## 2016-07-17 MED ORDER — DEXTROSE 5 % IV SOLN
INTRAVENOUS | Status: AC
  Administered 2016-07-17: 06:00:00 via INTRAVENOUS

## 2016-07-17 MED ORDER — DEXTROSE 5 % IV SOLN
2.0000 g | INTRAVENOUS | Status: DC
  Administered 2016-07-18 – 2016-07-20 (×3): 2 g via INTRAVENOUS
  Filled 2016-07-17 (×3): qty 2

## 2016-07-17 MED ORDER — DEXMEDETOMIDINE HCL IN NACL 200 MCG/50ML IV SOLN
0.4000 ug/kg/h | INTRAVENOUS | Status: DC
  Administered 2016-07-17: 0.4 ug/kg/h via INTRAVENOUS
  Filled 2016-07-17: qty 50

## 2016-07-17 MED ORDER — FAMOTIDINE 20 MG PO TABS
20.0000 mg | ORAL_TABLET | Freq: Every day | ORAL | Status: DC
  Administered 2016-07-17 – 2016-07-21 (×5): 20 mg
  Filled 2016-07-17 (×5): qty 1

## 2016-07-17 MED ORDER — MIDAZOLAM HCL 2 MG/2ML IJ SOLN
2.0000 mg | Freq: Once | INTRAMUSCULAR | Status: AC
Start: 2016-07-17 — End: 2016-07-17
  Administered 2016-07-17: 2 mg via INTRAVENOUS
  Filled 2016-07-17: qty 2

## 2016-07-17 NOTE — Progress Notes (Signed)
Agitation; currently restrained: Getting out of bed : versed X1 per Dr Molli KnockYacoub

## 2016-07-17 NOTE — Progress Notes (Signed)
CRITICAL VALUE ALERT  Critical value received:  Chloride >130  Date of notification:  07/17/2016  Time of notification:  1200  Critical value read back:Yes.    Nurse who received alert:  Binnie KandHyui Ksor, RN  MD notified (1st page):  Staci AcostaJ. Nestor, MD  Time of first page:  1215  Responding MD:  Staci AcostaJ. Nestor, MD  Time MD responded:  [phone]

## 2016-07-17 NOTE — Progress Notes (Signed)
CRITICAL VALUE ALERT  Critical value received:  Chloride >130  Date of notification:  07/17/16  Time of notification:  1610  Critical value read back:Yes.    Nurse who received alert:  Jeremy Johannara Parker  MD notified (1st page):  Glade NurseE Link MD; message left with RN  Time of first page:  1622  MD notified (2nd page):  Time of second page:  Responding MD:  S. Arsenio LoaderSommer, MD  Time MD responded:  [phone]

## 2016-07-17 NOTE — Progress Notes (Signed)
MD paged concerning critical lab value for chloride and sodium level.  Orders received to D/C D5 drip. MD also made aware of pt's BP trending downward since initiation of Precedex.  Precedex stopped. No new orders received in regards to BP. Will continue to monitor pt closely.

## 2016-07-17 NOTE — Progress Notes (Signed)
eLink Physician-Brief Progress Note Patient Name: Cristal FordLouis Grieshaber Jr. DOB: Feb 04, 1951 MRN: [id number]   Date of Service  07/17/2016  HPI/Events of Note  Na continues to rise  eICU Interventions  D5W at 50 ml/hr added for 24 hours only.     Intervention Category Major Interventions: Other:  Taurean Ju 07/17/2016, 5:44 AM

## 2016-07-17 NOTE — Progress Notes (Signed)
CRITICAL VALUE ALERT  Critical value received:  NA+163, CL 130  Date of notification:  07/17/2016  Time of notification:  0540  Critical value read back:Yes.    Nurse who received alert:  Gweneth DimitriMavis , RN  MD notified (1st page):  ELINK/md/ yacoub   Time of first page:  0540  MD notified (2nd page):  Time of second page:  Responding MD:  Dr. Molli KnockYacoub  Time MD responded:  [phone]

## 2016-07-17 NOTE — Progress Notes (Signed)
Nutrition Follow Up  DOCUMENTATION CODES:   Obesity unspecified  INTERVENTION:    Continue Vital High Protein at goal rate of 40 ml/hr with Prostat liquid protein 60 ml TID  Total TF regimen providing 1618 kcals, 174 gm protein, 806 ml free water daily  NUTRITION DIAGNOSIS:   Inadequate oral intake related to inability to eat as evidenced by NPO status, ongoing  GOAL:   Provide needs based on ASPEN/SCCM guidelines, met  MONITOR:   TF tolerance, Vent status, Labs, Weight trends, Skin, I & O's  ASSESSMENT:   66 y.o. male who presented with an 8 day history of AMS on 1/8. He had been found in his car with very little clothing on in temperatures that were significantly below freezing.  PMHx including DM2, morbid obesity, OSA, COPD, CKD3, HTN, HLD, seizure disorder, BPH, chronic back pain with sciatica, chronic gout and prior CVA.  CT head was obtained and showed no acute bleed, but a possible pontine stroke on the left.   Patient is currently intubated on ventilator support Temp (24hrs), Avg:98.8 F (37.1 C), Min:98.6 F (37 C), Max:99.2 F (37.3 C)  Propofol discontinued  Pt unable to protect airway due to altered mentation. MRI revealed L MCA infarct with cytotoxic edema. Vital High Protein formula infusing a goal rate of 40 ml/hr via OGT; Prostat 60 ml TID. Noted possible plan for trach and PEG tube. CBG's 122-280-120.  Diet Order:  Diet NPO time specified  Skin:  Reviewed, no issues  Last BM:  1/10  Height:   Ht Readings from Last 1 Encounters:  07/10/16 6' 2"  (1.88 m)    Weight:   Wt Readings from Last 1 Encounters:  07/17/16 278 lb 10.6 oz (126.4 kg)    Ideal Body Weight:  86.36 kg  BMI:  Body mass index is 35.78 kg/m.  Estimated Nutritional Needs:   Kcal:  1395-1775  Protein:  >/= 173 grams  Fluid:  Per MD  EDUCATION NEEDS:   No education needs identified at this time  Arthur Holms, RD, LDN Pager #: [phone] After-Hours Pager #:  [phone]

## 2016-07-17 NOTE — Progress Notes (Signed)
eLink Physician-Brief Progress Note Patient Name: Ronald FordLouis Herrin Jr. DOB: 08/24/1950 MRN: [id number]   Date of Service  07/17/2016  HPI/Events of Note  Na+ = 160.   eICU Interventions  Will increase the D5W IV infusion to 75 mL/hour.      Intervention Category Major Interventions: Electrolyte abnormality - evaluation and management  [name],[name] 07/17/2016, 4:33 PM

## 2016-07-17 NOTE — Progress Notes (Signed)
PULMONARY / CRITICAL CARE MEDICINE   Name: [name]. MRN: [id number] DOB: [birth year]    ADMISSION DATE:  07/10/2016 CONSULTATION DATE:  07/11/2016  REFERRING MD:  Perrin Maltese - Chambliss  CHIEF COMPLAINT:  AMS  HPI:  66 y.o. male with PMH as below, which is significant for COPD, DM, Hepatitis, HTN, Seizures, OSA, and cardiomyopathy. He was admitted 1/8 by FPTS for HHNK, PNA, and possible CVA after presenting with 8 days AMS and eventually was found sitting outside in the freezing temperature in his underwear. Since time of admission his mentation continued to worsen and he was placed on BiPAP.   SUBJECTIVE: Patient started on D5 overnight due to persistently rising sodium. No acute events overnight. Patient still not following commands per nurse at bedside.  REVIEW OF SYSTEMS: Unable to obtain given altered mental status and intubation.   VITAL SIGNS: BP 107/61 (BP Location: Right Arm)   Pulse 83   Temp 98.6 F (37 C) (Oral)   Resp 16   Ht 6\' 2"  (1.88 m)   Wt 278 lb 10.6 oz (126.4 kg)   SpO2 100%   BMI 35.78 kg/m   HEMODYNAMICS:    VENTILATOR SETTINGS: Vent Mode: PRVC FiO2 (%):  [40 %] 40 % Set Rate:  [16 bmp] 16 bmp Vt Set:  [650 mL] 650 mL PEEP:  [5 cmH20] 5 cmH20 Pressure Support:  [10 cmH20] 10 cmH20 Plateau Pressure:  [13 cmH20-22 cmH20] 22 cmH20  INTAKE / OUTPUT: I/O last 3 completed shifts: In: 4535 [I.V.:1755; NG/GT:2630; IV Piggyback:150] Out: 4590 [Urine:3400; Emesis/NG output:490; Stool:700]  PHYSICAL EXAMINATION: General:  No acute distress. Wife at bedside. Obese male. Integument:  Warm & dry. No rash on exposed skin. Extremities:  No cyanosis or clubbing.  HEENT:  Moist mucus membranes.No scleral icterus. Endotracheal tube in place. Cardiovascular:  Regular rate. Normal S1 & vascular. No appreciable JVD given body habitus.  Pulmonary:  Good aeration & clear to auscultation bilaterally. Symmetric chest wall rise on ventilator. Abdomen: Soft. Normal  bowel sounds. Protuberant. Musculoskeletal:  Normal bulk. No joint deformity or effusion appreciated. Neurological: Right gaze. Spontaneously moving left arm and left leg. Not following commands or attending to voice.  LABS:  BMET  Recent Labs Lab 07/16/16 0430 07/16/16 1946 07/17/16 0451  NA 160* 161* 163*  K 3.8 4.1 4.0  CL 129* >130* >130*  CO2 21* 22 22  BUN 37* 40* 41*  CREATININE 1.94* 2.03* 1.98*  GLUCOSE 145* 165* 136*    Electrolytes  Recent Labs Lab 07/12/16 1638 07/13/16 0400 07/13/16 1619  07/16/16 0430 07/16/16 1946 07/17/16 0451  CALCIUM  --  8.5*  --   < > 8.9 8.9 8.9  MG 2.4 2.3 2.2  --   --   --   --   PHOS 3.2 3.2 3.4  --   --   --   --   < > = values in this interval not displayed.  CBC  Recent Labs Lab 07/15/16 0316 07/16/16 0430 07/17/16 0451  WBC 15.1* 11.7* 9.4  HGB 11.9* 11.0* 10.4*  HCT 35.4* 33.0* 31.9*  PLT 284 274 294    Coag's  Recent Labs Lab 07/10/16 0854  INR 1.19    Sepsis Markers  Recent Labs Lab 07/10/16 1440 07/10/16 1755 07/11/16 0425 07/12/16 0212 07/13/16 0400 07/14/16 0430  LATICACIDVEN 3.6* 2.9* 1.7  --   --   --   PROCALCITON  --   --   --  0.36 0.46 0.37  ABG  Recent Labs Lab 07/11/16 1142 07/11/16 2004 07/12/16 0030  PHART 7.288* 7.329* 7.365  PCO2ART 40.1 36.3 32.5  PO2ART 123.0* 100 196*    Liver Enzymes  Recent Labs Lab 07/10/16 0820 07/17/16 0451  AST 49* 31  ALT 32 20  ALKPHOS 165* 53  BILITOT 1.5* 0.3  ALBUMIN 4.6 2.1*    Cardiac Enzymes  Recent Labs Lab 07/10/16 2111 07/11/16 0137 07/11/16 0425  TROPONINI 0.19* 0.20* 0.20*    Glucose  Recent Labs Lab 07/16/16 1204 07/16/16 1553 07/16/16 1940 07/16/16 2344 07/17/16 0135 07/17/16 0355  GLUCAP 192* 167* 139* 328* 157* 122*    Imaging No results found.  STUDIES:  CT Head 1/8: Motion degraded exam demonstrating no definite cerebral infarction or hemorrhage. Mild atrophy and small vessel disease,  similar to priors. Asymmetric hypodensity of the LEFT mid pons, favored to represent artifact; if signs and symptoms of brainstem infarction are present, recommend further assessment with MR imaging when the patient is better able to remain motionless. MRI Brain 1/10: large L MCA posterior CVA with associated edema, trace petechial hemorrhage, no mass effect; notable posterior circ stenosis, R anterior circ EEG 1/10: no active seizures, global cerebral dysfunction.   MICROBIOLOGY: MRSA PCR 1/8:  Negative  Blood Ctx x2 1/8:  1/2 Bottles Mircrococcus luteus Urine 1/8:  Insignificant growth Blood Ctx x2 1/11 >>   ANTIBIOTICS: Azithromycin 1/8 - 1/9 Acyclovir 1/10 - 1/11 Zosyn 1/8 - 1/10 Vanco 1/8 - 1/11 Rocephin 1/8 >>  SIGNIFICANT EVENTS: 1/08 - ED for 8 day history of AMS 1/09 - Intubated for inability to protect airway  1/10 - MRI shows left pos MCA cva  LINES/TUBES: OETT 8.0 1/9 >>  L IJ CVL 1/10 >> OGT 1/9 >> Foley 1/9 >>  ASSESSMENT / PLAN:  PULMONARY A: Acute Respiratory Failure - Unable to protect airway due to altered mentation. Possible Aspiration Pneumonia - LLL. OSA - Not currently on CPAP. H/O COPD - No signs of exacerbation.   P:   Continuing PS wean & SBT as mental status allows Duoneb q6hr Intermittent ABG & Portable CXR Holding home Advair Likely will need Tracheostomy  NEUROLOGIC A:   L MCA CVA - New this admission. Seizure Disorder - None seen on EEG 1/10. Acute Encephalopathy - Likely multifactorial & possible component of delirium. Ammonia 70 but trending down. H/O Pontine CVA Sedation on Ventilator  P:   RASS goal: 0 to -1 Fentanyl gtt & IV prn Starting Precedex drip Neurology Following AEDs:  Depakene (changed from home Keppra due to nephrotoxicity) ASA 325mg  daily  CARDIOVASCULAR A:  Chronic Diastolic CHF - Questionable acute component.  Grossly positive fluid balance since admission. Essential Hypertension - BP labile.   P:   Continuous telemetry monitoring Vitals per unit protocol Continuing Lipitor & ASA Holding home Lisinopril & Coreg Hydralazine IV prn Holding off on Plavix until ok with Neurology given hemorrhage on brain MRI  RENAL A:   Acute on Chronic Renal Failure Stage 3 - Baseline Creatinine 1.9. Resolved acute issue.   Hypernatremia - Worsening. H/O BPH - On Flomax.  P:   Trending electrolytes q4hr Continuing D5W @ 50cc/hr Monitoring UOP Replacing electrolytes as indicated  GASTROINTESTINAL A:   GERD H/O Hepatitis C Viral Infection  P:   NPO Pepcid VT daily Continuing Tube Feedings Plan for PEG if requires a tracheostomy  HEMATOLOGIC A:   Anemia - Mild. No signs of active bleeding.  P:  Trending cell counts daily w/ CBC SCDs  Heparin Martin q8hr  INFECTIOUS A:   Suspected Aspiration Pneumonia Possible Bacteremia - Likely contamination w/ repeat cultures pending.  P:   Empiric Rocephin Day #8 (total abx) Awaiting finalization of blood cultures  ENDOCRINE A:   H/O DM - Glucose stable. HHNK - Resolved.  P:   Accu-Checks q4hr SSI per Moderate Aglorithm Discontinuing scheduled Novolog Lantus 15u [place] BID  FAMILY  - Updates: Updated at bedside 1/15 by Dr. Jamison NeighborNestor.  - Inter-disciplinary family meet or Palliative Care meeting due by:  1/16  TODAY'S SUMMARY:  66 y.o. male with known history of multiple medical problems including COPD, seizures, and sleep apnea presenting with HHNK and altered mentation. Found to have a left MCA CVA. Neurological status is the main barrier to extubation as he is unable to protect his airway. His acute renal failure has significant improved. Glucose appears well-controlled at this time with his underlying diabetes. Discussed the high probability of tracheostomy with patient's wife at bedside. Attempting to start Precedex and wean fentanyl to help with mentation and case delirium is contributing.  I have personally spent a total of 39 minutes of  critical care time today caring for the patient and reviewing the patient's electronic medical record.  Donna ChristenJennings E. Jamison NeighborNestor, M.D. [facility] Pager:  [phone] After 3pm or if no response, call [phone] 6:26 AM 07/17/16

## 2016-07-17 NOTE — Progress Notes (Signed)
STROKE TEAM PROGRESS NOTE   SUBJECTIVE (INTERVAL HISTORY) Wife is at the bedside. Pt condition unchanged. Spontaneous opening eyes, not following commands, intermittent agitation, needs restraint on the left. No more seizure like activity. Na still high at 163, still hyperglycemia. Afebrile overnight.   OBJECTIVE Temp:  [98.6 F (37 C)-99.2 F (37.3 C)] 98.8 F (37.1 C) (01/15 1238) Pulse Rate:  [69-112] 76 (01/15 1245) Cardiac Rhythm: Sinus tachycardia (01/15 1100) Resp:  [13-23] 16 (01/15 1245) BP: (83-180)/(54-98) 97/65 (01/15 1245) SpO2:  [95 %-100 %] 100 % (01/15 1245) FiO2 (%):  [40 %] 40 % (01/15 1140) Weight:  [278 lb 10.6 oz (126.4 kg)] 278 lb 10.6 oz (126.4 kg) (01/15 0500)  CBC:  Recent Labs Lab 07/14/16 0430  07/16/16 0430 07/17/16 0451  WBC 12.6*  < > 11.7* 9.4  NEUTROABS 7.7  --   --   --   HGB 11.3*  < > 11.0* 10.4*  HCT 33.5*  < > 33.0* 31.9*  MCV 74.0*  < > 76.7* 77.1*  PLT 266  < > 274 294  < > = values in this interval not displayed.  Basic Metabolic Panel:   Recent Labs Lab 07/13/16 0400 07/13/16 1619  07/17/16 0451 07/17/16 1120  NA 147*  --   < > 163* 158*  K 4.1  --   < > 4.0 3.7  CL 120*  --   < > >130* >130*  CO2 19*  --   < > 22 21*  GLUCOSE 286*  --   < > 136* 172*  BUN 42*  --   < > 41* 40*  CREATININE 2.95*  --   < > 1.98* 2.02*  CALCIUM 8.5*  --   < > 8.9 8.6*  MG 2.3 2.2  --   --   --   PHOS 3.2 3.4  --   --   --   < > = values in this interval not displayed.  Lipid Panel:     Component Value Date/Time   CHOL 110 07/13/2016 0400   TRIG 152 (H) 07/13/2016 0400   HDL 16 (L) 07/13/2016 0400   CHOLHDL 6.9 07/13/2016 0400   VLDL 30 07/13/2016 0400   LDLCALC 64 07/13/2016 0400   HgbA1c:  Lab Results  Component Value Date   HGBA1C 12.3 (H) 07/10/2016   Urine Drug Screen:     Component Value Date/Time   LABOPIA NONE DETECTED 07/10/2016 2239   COCAINSCRNUR NONE DETECTED 07/10/2016 2239   LABBENZ NONE DETECTED 07/10/2016  2239   AMPHETMU NONE DETECTED 07/10/2016 2239   THCU NONE DETECTED 07/10/2016 2239   LABBARB NONE DETECTED 07/10/2016 2239      IMAGING  I have personally reviewed the radiological images below and agree with the radiology interpretations.  Ct Head Wo Contrast 07/10/2016 Motion degraded exam demonstrating no definite cerebral infarction or hemorrhage. Mild atrophy and small vessel disease, similar to priors. Asymmetric hypodensity of the LEFT mid pons, favored to represent artifact; if signs and symptoms of brainstem infarction are present, recommend further assessment with MR imaging when the patient is better able to remain motionless.   Mr Brain 39 Contrast Mr Maxine Glenn Head Wo Contrast 07/12/2016 1. Large posterior left MCA territory infarct with cytotoxic edema and trace petechial hemorrhage but no intracranial mass effect at this time.  2. No associated large vessel or left MCA branch occlusion.  3. Intracranial MRA is positive for moderate to severe posterior circulation atherosclerosis and stenosis (involving both distal  vertebral arteries and the proximal basilar artery), with mild to moderate atherosclerosis and stenosis in the right anterior circulation (right supraclinoid ICA and right MCA bifurcation).   EEG This sedated EEG is abnormal due to moderate low voltage diffuse slowing of the background.  CUS - Carotid duplex attempted, limited exam due to IV dressing and patient positioning.  Findings consistent with a 60 - 79 percent stenosis involving the proximal right internal carotid artery. Images and Doppler evaluation could be underestimated due to suboptimal scanning conditions. Left carotid arteries not imaged due to IV dressing.  TTE - Left ventricle: The cavity size was normal. Wall thickness was   normal. Systolic function was vigorous. The estimated ejection   fraction was in the range of 65% to 70%. Wall motion was normal;   there were no regional wall motion abnormalities.  Doppler   parameters are consistent with abnormal left ventricular   relaxation (grade 1 diastolic dysfunction). - Aortic valve: Discrete hyperechoic nodule seen on the right   coronary cusp is likely due to degenerative changes, but cannot   exclude small papillary fibroelastoma. Consider TEE.  Bilateral lower extremity venous duplex There is no evidence of deep or superficial vein thrombosis involving the right and left lower extremities. All visualized vessels appear patent and compressible. There is no evidence of Baker's cysts bilaterally.  Right upper extremity venous duplex There is no obvious evidence of deep or superficial vein thrombosis involving the right upper extremity. All clearly visualized vessels appear patent and compressible.    PHYSICAL EXAM  Obese middle aged african [place] male who is intubated and sedated on fetanyl. Afebrile. Head is nontraumatic. Neck is supple. Cardiac exam mild tachycardia, normal rhythm. Distal pulses are well felt. Neurological Exam  Intubated on sedation, eyes open, but not blinking and agitated with restless. Does not follow commands. Eyes right gaze position, not blinking to visual threat. Minimal doll's eyes elicited. Pinpoint pupil size with sluggish reaction to light, positive corneal and gag. Tongue is midline in mouth. Motor system exam purposeful antigravity movements on left UE. LLE mild withdraw on pain 2/5. However, right hemiplegia on pain stimulation. B/l positive babinski, DTR diminished. Sensation, coordination and gait not tested.    ASSESSMENT/PLAN Mr. Ronald FordLouis Hillis Jr. is a 66 y.o. male with history of uncontrolled Type II DM, Stage III CKD, HTN, HLD, seizure disorder, COPD, CVA, and chronic gout presenting with 8 day history of AMS, right sided weakness, and not communicating x 2 days. He did not receive IV t-PA due to delay in arrival.   Stroke:  Left MCA posterior division embolic infarct with cytotoxic edema secondary to  unclear source  Resultant  Intubated, aphasia, R hemiparesis, R facial droop  Code Stroke CT concerning for L mid pontine infarct but MRI no pontine infarct  MRI  posterior division left MCA infarct  MRA right M2, BA, b/l VA stenosis/athero  Carotid Doppler - right ICA 60-79% stenosis and left ICA not able to see well  Will do CTA neck once Cre improved.   2D Echo EF 65% to 70%. But there is AV nodule - need TEE for further eval once stable  LE and right UE venous doppler - no DVT  LDL 64   HgbA1c - 12.3 -> diabetic coordinator consulted  Heparin subq for VTE prophylaxis Diet NPO time specified, on TF @ 40  clopidogrel 75 mg daily and ASA prior to admission, now on aspirin per tube for secondary stroke prevention. May consider DAPT later  if pt stable and no more procedure planned.   Ongoing aggressive stroke risk factor management  Therapy recommendations:  pending   Disposition:  pending   Acute respiratory Failure  Intubated for respiratory failure  CCM on board  On CPAP now  Encephalopathy   Metabolic etiology  Related to DKA, fever, CHF, hypernatremia, leukocytosis, hyperammonemia  Treat underlying disease  CCM on board  Seizure Disorder  Hx seizures  On Keppra PTA  No observed seizure activity  EEG - low voltage, slowing, no sz  Given renal failure, d/c Keppra and started depacon 750 q 8  Depakote level 116 -> 75 -> 74  Hyperammonemia  Ammonia level 30->70->29  Continue observe  Fever with leukocystosis  Currently afebrile  Still has cooling blanket  Rocephine - started 1/10 2018  LP negative for meningitis  Culture so far NGTD  WBC 27.7->18.7->12.6->15.1 -> 11.7->9.4  Hypernatremia from hyponatremia  Na 120-135-142-147-154-155-154 -> 160-> 163  On TF @ 40  On free water and D5  CCM on board  DKA Diabetes, uncontrolled Diabetic Neuropathy  Glucose levels still elevated  HgbA1c 12.3 , goal <  7.0  Uncontrolled  Diabetes RN following  SSI  On lantus  Hypotension Hx Hypertension  Variable with Low BP  Propofol changed to fentanyl  From stroke standpoint, gradually normalize in 5-7 days  Long-term BP goal normotensive  Hyperlipidemia  Home meds:  lipitor 80  Resume statin when able in hospital  LDL 64, goal < 70  Continue statin at discharge  Other Stroke Risk Factors  Advanced age  Obesity, Body mass index is 35.78 kg/m., recommend weight loss, diet and exercise as appropriate   Hx stroke/TIA - Details not available  Coronary artery disease  Obstructive sleep apnea, on CPAP at home  CHF - on admission CXR  Other Active Problems  Acute on chronic renal failure - 3.92-2.95-2.36-2.01 -> 1.94->2.02  hypercalcemia  COPD  Chronic gout  Chronic back pain and sciatica  BPH  Hospital day # 7   This patient is critically ill due to left MCA infarct, intubation, fever, sepsis, DKA, encephalopathy, seizure and at significant risk of neurological worsening, death form recurrent stroke, hemorrhagic transformation, DKA, heart failure, status epilepticus. This patient's care requires constant monitoring of vital signs, hemodynamics, respiratory and cardiac monitoring, review of multiple databases, neurological assessment, discussion with family, other specialists and medical decision making of high complexity. I spent 40 minutes of neurocritical care time in the care of this patient.  Marvel Plan, MD PhD Stroke Neurology 07/17/2016 1:09 PM    To contact Stroke Continuity provider, please refer to [email or web address]. After hours, contact General Neurology

## 2016-07-18 ENCOUNTER — Inpatient Hospital Stay (HOSPITAL_COMMUNITY): Payer: Medicare PPO

## 2016-07-18 LAB — CULTURE, BLOOD (ROUTINE X 2)
CULTURE: NO GROWTH
Culture: NO GROWTH

## 2016-07-18 LAB — CBC WITH DIFFERENTIAL/PLATELET
Basophils Absolute: 0 10*3/uL (ref 0.0–0.1)
Basophils Relative: 0 %
EOS PCT: 5 %
Eosinophils Absolute: 0.5 10*3/uL (ref 0.0–0.7)
HEMATOCRIT: 32 % — AB (ref 39.0–52.0)
Hemoglobin: 10.4 g/dL — ABNORMAL LOW (ref 13.0–17.0)
LYMPHS ABS: 2.1 10*3/uL (ref 0.7–4.0)
LYMPHS PCT: 21 %
MCH: 25.1 pg — AB (ref 26.0–34.0)
MCHC: 32.5 g/dL (ref 30.0–36.0)
MCV: 77.1 fL — AB (ref 78.0–100.0)
Monocytes Absolute: 1.1 10*3/uL — ABNORMAL HIGH (ref 0.1–1.0)
Monocytes Relative: 11 %
NEUTROS ABS: 6.2 10*3/uL (ref 1.7–7.7)
Neutrophils Relative %: 63 %
PLATELETS: 277 10*3/uL (ref 150–400)
RBC: 4.15 MIL/uL — AB (ref 4.22–5.81)
RDW: 18.9 % — ABNORMAL HIGH (ref 11.5–15.5)
WBC: 10 10*3/uL (ref 4.0–10.5)

## 2016-07-18 LAB — BASIC METABOLIC PANEL
ANION GAP: 12 (ref 5–15)
ANION GAP: 5 (ref 5–15)
ANION GAP: 8 (ref 5–15)
Anion gap: 8 (ref 5–15)
Anion gap: 10 (ref 5–15)
BUN: 42 mg/dL — ABNORMAL HIGH (ref 6–20)
BUN: 42 mg/dL — ABNORMAL HIGH (ref 6–20)
BUN: 38 mg/dL — ABNORMAL HIGH (ref 6–20)
BUN: 38 mg/dL — ABNORMAL HIGH (ref 6–20)
BUN: 40 mg/dL — ABNORMAL HIGH (ref 6–20)
CALCIUM: 8.7 mg/dL — AB (ref 8.9–10.3)
CALCIUM: 8.9 mg/dL (ref 8.9–10.3)
CHLORIDE: 127 mmol/L — AB (ref 101–111)
CHLORIDE: 124 mmol/L — AB (ref 101–111)
CHLORIDE: 129 mmol/L — AB (ref 101–111)
CHLORIDE: 124 mmol/L — AB (ref 101–111)
CO2: 21 mmol/L — AB (ref 22–32)
CO2: 22 mmol/L (ref 22–32)
CO2: 22 mmol/L (ref 22–32)
CO2: 21 mmol/L — AB (ref 22–32)
CO2: 21 mmol/L — ABNORMAL LOW (ref 22–32)
CREATININE: 2.24 mg/dL — AB (ref 0.61–1.24)
CREATININE: 2.15 mg/dL — AB (ref 0.61–1.24)
Calcium: 8.7 mg/dL — ABNORMAL LOW (ref 8.9–10.3)
Calcium: 8.7 mg/dL — ABNORMAL LOW (ref 8.9–10.3)
Calcium: 8.6 mg/dL — ABNORMAL LOW (ref 8.9–10.3)
Chloride: 125 mmol/L — ABNORMAL HIGH (ref 101–111)
Creatinine, Ser: 2.21 mg/dL — ABNORMAL HIGH (ref 0.61–1.24)
Creatinine, Ser: 2.19 mg/dL — ABNORMAL HIGH (ref 0.61–1.24)
Creatinine, Ser: 2.15 mg/dL — ABNORMAL HIGH (ref 0.61–1.24)
GFR calc non Af Amer: 29 mL/min — ABNORMAL LOW (ref >60)
GFR calc non Af Amer: 30 mL/min — ABNORMAL LOW (ref >60)
GFR calc non Af Amer: 30 mL/min — ABNORMAL LOW (ref >60)
GFR calc non Af Amer: 31 mL/min — ABNORMAL LOW (ref >60)
GFR, EST AFRICAN AMERICAN: 34 mL/min — AB (ref >60)
GFR, EST AFRICAN AMERICAN: 34 mL/min — AB (ref >60)
GFR, EST AFRICAN AMERICAN: 35 mL/min — AB (ref >60)
GFR, EST AFRICAN AMERICAN: 35 mL/min — AB (ref >60)
GFR, EST AFRICAN AMERICAN: 35 mL/min — AB (ref >60)
GFR, EST NON AFRICAN AMERICAN: 31 mL/min — AB (ref >60)
Glucose, Bld: 207 mg/dL — ABNORMAL HIGH (ref 65–99)
Glucose, Bld: 179 mg/dL — ABNORMAL HIGH (ref 65–99)
Glucose, Bld: 256 mg/dL — ABNORMAL HIGH (ref 65–99)
Glucose, Bld: 252 mg/dL — ABNORMAL HIGH (ref 65–99)
Glucose, Bld: 250 mg/dL — ABNORMAL HIGH (ref 65–99)
POTASSIUM: 3.9 mmol/L (ref 3.5–5.1)
POTASSIUM: 3.7 mmol/L (ref 3.5–5.1)
POTASSIUM: 4 mmol/L (ref 3.5–5.1)
Potassium: 3.8 mmol/L (ref 3.5–5.1)
Potassium: 3.9 mmol/L (ref 3.5–5.1)
SODIUM: 158 mmol/L — AB (ref 135–145)
SODIUM: 156 mmol/L — AB (ref 135–145)
SODIUM: 153 mmol/L — AB (ref 135–145)
SODIUM: 156 mmol/L — AB (ref 135–145)
Sodium: 156 mmol/L — ABNORMAL HIGH (ref 135–145)

## 2016-07-18 LAB — GLUCOSE, CAPILLARY
GLUCOSE-CAPILLARY: 223 mg/dL — AB (ref 65–99)
GLUCOSE-CAPILLARY: 224 mg/dL — AB (ref 65–99)
Glucose-Capillary: 187 mg/dL — ABNORMAL HIGH (ref 65–99)
Glucose-Capillary: 210 mg/dL — ABNORMAL HIGH (ref 65–99)
Glucose-Capillary: 212 mg/dL — ABNORMAL HIGH (ref 65–99)
Glucose-Capillary: 220 mg/dL — ABNORMAL HIGH (ref 65–99)

## 2016-07-18 LAB — PHOSPHORUS: Phosphorus: 3.3 mg/dL (ref 2.5–4.6)

## 2016-07-18 LAB — VALPROIC ACID LEVEL: Valproic Acid Lvl: 77 ug/mL (ref 50.0–100.0)

## 2016-07-18 LAB — MAGNESIUM: Magnesium: 2.2 mg/dL (ref 1.7–2.4)

## 2016-07-18 MED ORDER — DEXTROSE 5 % IV SOLN
INTRAVENOUS | Status: DC
  Administered 2016-07-19: via INTRAVENOUS

## 2016-07-18 NOTE — Procedures (Signed)
ELECTROENCEPHALOGRAM REPORT  Date of Study: 07/18/2016  Patient's Name: [name]. MRN: [id number] Date of Birth: [birth year]  Referring Provider: Rosalin Hawking, MD  Clinical History: This is a 66 year old man with altered mental status, unchanged status.   Medications: albuterol (PROVENTIL) (2.5 MG/3ML) 0.083% nebulizer solution 2.5 mg  aspirin tablet 325 mg  atorvastatin (LIPITOR) tablet 80 mg  cefTRIAXone (ROCEPHIN) 2 g in dextrose 5 % 50 mL IVPB  chlorhexidine gluconate (MEDLINE KIT) (PERIDEX) 0.12 % solution 15 mL  dexmedetomidine (PRECEDEX) 200 MCG/50ML (4 mcg/mL) infusion  famotidine (PEPCID) tablet 20 mg   fentaNYL (SUBLIMAZE) 2,500 mcg in sodium chloride 0.9 % 250 mL (10 mcg/mL) infusion  hydrALAZINE (APRESOLINE) injection 10-40 mg  insulin aspart (novoLOG) injection 0-15 Units  Insulin glargine (LANTUS) injection 15 Units  ipratropium-albuterol (DUONEB) 0.5-2.5 (3) MG/3ML nebulizer solution 3 mL  latanoprost (XALATAN) 0.005 % ophthalmic solution 1 drop  Valproate Sodium (DEPAKENE) solution 750 mg       Technical Summary: A multichannel digital EEG recording measured by the international 10-20 system with electrodes applied with paste and impedances below 5000 ohms performed as portable with EKG monitoring in an intubated patient who is on Fentanyl. Hyperventilation and photic stimulation were not performed.  The digital EEG was referentially recorded, reformatted, and digitally filtered in a variety of bipolar and referential montages for optimal display.   Description: The patient is intubated and not following commands during the recording.  There is no clear posterior dominant rhythm. The background consists of a large amount of diffuse low voltage theta and delta slowing with beta activity seen at times. Normal sleep architecture is not seen. With stimulation, there is an increase in faster frequencies and movement/muscle artifact. Hyperventilation and photic  stimulation were not performed.  There were no epileptiform discharges or electrographic seizures seen.    EKG lead was unremarkable.  Impression: This sedated EEG is abnormal due to moderate low voltage diffuse slowing of the background.  Clinical Correlation of the above findings indicates diffuse cerebral dysfunction that is non-specific in etiology and can be seen with hypoxic/ischemic injury, toxic/metabolic encephalopathies, or medication effect from Fentanyl.  The absence of epileptiform discharges does not rule out a clinical diagnosis of epilepsy.  If indicated, consider repeat EEG off of sedation.  Clinical correlation is advised.  Shavonne Ambroise R. Tomi Likens, DO

## 2016-07-18 NOTE — Progress Notes (Signed)
Pt transported to CT with RT assistance. VSS throughout transport.

## 2016-07-18 NOTE — Progress Notes (Signed)
RT assisted in pt transport on vent to CT and back to 2S06. Vitals remained stable throughout.

## 2016-07-18 NOTE — Progress Notes (Signed)
EEG completed, results pending. 

## 2016-07-18 NOTE — Progress Notes (Signed)
STROKE TEAM PROGRESS NOTE   SUBJECTIVE (INTERVAL HISTORY) Wife is at the bedside. Pt condition largely unchanged. Spontaneous opening eyes, not following commands, intermittent agitation, left arm and leg not as active as yesterday. No seizure like activity. Na still high at 158, afebrile overnight.   OBJECTIVE Temp:  [98.6 F (37 C)-100.2 F (37.9 C)] 99.8 F (37.7 C) (01/16 0321) Pulse Rate:  [69-124] 118 (01/16 0800) Cardiac Rhythm: Sinus tachycardia (01/16 0800) Resp:  [14-20] 16 (01/16 0800) BP: (83-173)/(54-92) 173/81 (01/16 0800) SpO2:  [96 %-100 %] 99 % (01/16 0800) FiO2 (%):  [40 %] 40 % (01/16 0743) Weight:  [278 lb 3.5 oz (126.2 kg)] 278 lb 3.5 oz (126.2 kg) (01/16 0317)  CBC:  Recent Labs Lab 07/14/16 0430  07/17/16 0451 07/18/16 0239  WBC 12.6*  < > 9.4 10.0  NEUTROABS 7.7  --   --  6.2  HGB 11.3*  < > 10.4* 10.4*  HCT 33.5*  < > 31.9* 32.0*  MCV 74.0*  < > 77.1* 77.1*  PLT 266  < > 294 277  < > = values in this interval not displayed.  Basic Metabolic Panel:   Recent Labs Lab 07/13/16 1619  07/18/16 0239 07/18/16 0700  NA  --   < > 156* 158*  K  --   < > 3.9 3.7  CL  --   < > 127* 124*  CO2  --   < > 21* 22  GLUCOSE  --   < > 207* 179*  BUN  --   < > 42* 42*  CREATININE  --   < > 2.24* 2.21*  CALCIUM  --   < > 8.7* 8.7*  MG 2.2  --  2.2  --   PHOS 3.4  --  3.3  --   < > = values in this interval not displayed.  Lipid Panel:     Component Value Date/Time   CHOL 110 07/13/2016 0400   TRIG 152 (H) 07/13/2016 0400   HDL 16 (L) 07/13/2016 0400   CHOLHDL 6.9 07/13/2016 0400   VLDL 30 07/13/2016 0400   LDLCALC 64 07/13/2016 0400   HgbA1c:  Lab Results  Component Value Date   HGBA1C 12.3 (H) 07/10/2016   Urine Drug Screen:     Component Value Date/Time   LABOPIA NONE DETECTED 07/10/2016 2239   COCAINSCRNUR NONE DETECTED 07/10/2016 2239   LABBENZ NONE DETECTED 07/10/2016 2239   AMPHETMU NONE DETECTED 07/10/2016 2239   THCU NONE DETECTED  07/10/2016 2239   LABBARB NONE DETECTED 07/10/2016 2239      IMAGING  I have personally reviewed the radiological images below and agree with the radiology interpretations.  Ct Head Wo Contrast 07/10/2016 Motion degraded exam demonstrating no definite cerebral infarction or hemorrhage. Mild atrophy and small vessel disease, similar to priors. Asymmetric hypodensity of the LEFT mid pons, favored to represent artifact; if signs and symptoms of brainstem infarction are present, recommend further assessment with MR imaging when the patient is better able to remain motionless.   Mr Brain 38 Contrast Mr Maxine Glenn Head Wo Contrast 07/12/2016 1. Large posterior left MCA territory infarct with cytotoxic edema and trace petechial hemorrhage but no intracranial mass effect at this time.  2. No associated large vessel or left MCA branch occlusion.  3. Intracranial MRA is positive for moderate to severe posterior circulation atherosclerosis and stenosis (involving both distal vertebral arteries and the proximal basilar artery), with mild to moderate atherosclerosis and stenosis in the  right anterior circulation (right supraclinoid ICA and right MCA bifurcation).   EEG This sedated EEG is abnormal due to moderate low voltage diffuse slowing of the background.  CUS - Carotid duplex attempted, limited exam due to IV dressing and patient positioning.  Findings consistent with a 60 - 79 percent stenosis involving the proximal right internal carotid artery. Images and Doppler evaluation could be underestimated due to suboptimal scanning conditions. Left carotid arteries not imaged due to IV dressing.  TTE - Left ventricle: The cavity size was normal. Wall thickness was   normal. Systolic function was vigorous. The estimated ejection   fraction was in the range of 65% to 70%. Wall motion was normal;   there were no regional wall motion abnormalities. Doppler   parameters are consistent with abnormal left  ventricular   relaxation (grade 1 diastolic dysfunction). - Aortic valve: Discrete hyperechoic nodule seen on the right   coronary cusp is likely due to degenerative changes, but cannot   exclude small papillary fibroelastoma. Consider TEE.  Bilateral lower extremity venous duplex There is no evidence of deep or superficial vein thrombosis involving the right and left lower extremities. All visualized vessels appear patent and compressible. There is no evidence of Baker's cysts bilaterally.  Right upper extremity venous duplex There is no obvious evidence of deep or superficial vein thrombosis involving the right upper extremity. All clearly visualized vessels appear patent and compressible.   CT head pending  EEG pending   PHYSICAL EXAM  Obese middle aged african [place] male who is intubated and sedated on fetanyl. Afebrile. Head is nontraumatic. Neck is supple. Cardiac exam mild tachycardia, normal rhythm. Distal pulses are well felt. Neurological Exam  Intubated on sedation, eyes open, but not blinking and agitated with restless. Does not follow commands. Eyes middle position, not blinking to visual threat. Minimal doll's eyes elicited. Pinpoint pupil size with sluggish reaction to light, positive corneal and gag. Tongue is midline in mouth. Motor system exam LUE and LLE mild withdraw on pain 2/5. However, right hemiplegia on pain stimulation. B/l positive babinski, DTR diminished. Sensation, coordination and gait not tested.    ASSESSMENT/PLAN Mr. Ronald FordLouis Fleites Jr. is a 66 y.o. male with history of uncontrolled Type II DM, Stage III CKD, HTN, HLD, seizure disorder, COPD, CVA, and chronic gout presenting with 8 day history of AMS, right sided weakness, and not communicating x 2 days. He did not receive IV t-PA due to delay in arrival.   Stroke:  Left MCA posterior division embolic infarct with cytotoxic edema secondary to unclear source  Resultant  Intubated, aphasia, R hemiparesis, R  facial droop  Code Stroke CT concerning for L mid pontine infarct but MRI no pontine infarct  MRI  posterior division left MCA infarct  MRA right M2, BA, b/l VA stenosis/athero  Carotid Doppler - right ICA 60-79% stenosis and left ICA not able to see well  CT repeat pending  Will do CTA neck once Cre improved.   2D Echo EF 65% to 70%. But there is AV nodule - need TEE for further eval once stable  LE and right UE venous doppler - no DVT  LDL 64   HgbA1c - 12.3  Heparin subq for VTE prophylaxis Diet NPO time specified, on TF @ 40  clopidogrel 75 mg daily and ASA prior to admission, now on aspirin per tube for secondary stroke prevention. May consider DAPT later if pt stable and no more procedure planned.   Ongoing aggressive stroke risk  factor management  Therapy recommendations:  pending   Disposition:  pending   Acute respiratory Failure  Intubated for respiratory failure  CCM on board  On CPAP now  Encephalopathy   Metabolic etiology  Related to DKA, fever, CHF, AKI, hypernatremia, leukocytosis, hyperammonemia  Treat underlying disease  CCM on board  Seizure Disorder  Hx seizures  On Keppra PTA  No observed seizure activity  EEG - low voltage, slowing, no sz  Given renal failure, d/c Keppra and started depacon 750 q 8  Depakote level 116 -> 75 -> 74->77  EEG repeat pending  Hyperammonemia  Ammonia level 30->70->29  Continue observe  Fever with leukocystosis  Currently afebrile  Still has cooling blanket  Rocephine - started 1/10 2018  LP negative for meningitis  Culture so far NGTD  WBC 27.7->18.7->12.6->15.1 -> 11.7->9.4->10.0  Hypernatremia from hyponatremia  Na 120-135-142-147-154-155-154 -> 160-> 163->158  On TF @ 40  On free water  CCM on board  DKA Diabetes, uncontrolled Diabetic Neuropathy  Glucose levels still elevated  HgbA1c 12.3 , goal < 7.0  Uncontrolled  Diabetes RN following  SSI  On  lantus  Hypotension Hx Hypertension  Variable with Low BP  Propofol changed to fentanyl  From stroke standpoint, gradually normalize in 5-7 days  Long-term BP goal normotensive  AKI on CKD  Cre 3.92-2.95-2.36-2.01 -> 1.94->2.24->2.21  Likely due to water deficit  On free water  Hyperlipidemia  Home meds:  lipitor 80  Resume statin when able in hospital  LDL 64, goal < 70  Continue statin at discharge  Other Stroke Risk Factors  Advanced age  Obesity, Body mass index is 35.72 kg/m., recommend weight loss, diet and exercise as appropriate   Hx stroke/TIA - Details not available  Coronary artery disease  Obstructive sleep apnea, on CPAP at home  CHF - on admission CXR  Other Active Problems  hypercalcemia  COPD  Chronic gout  Chronic back pain and sciatica  BPH  Hospital day # 8   This patient is critically ill due to left MCA infarct, intubation, fever, sepsis, DKA, encephalopathy, seizure and at significant risk of neurological worsening, death form recurrent stroke, hemorrhagic transformation, DKA, heart failure, status epilepticus. This patient's care requires constant monitoring of vital signs, hemodynamics, respiratory and cardiac monitoring, review of multiple databases, neurological assessment, discussion with family, other specialists and medical decision making of high complexity. I spent 35 minutes of neurocritical care time in the care of this patient.  Marvel Plan, MD PhD Stroke Neurology 07/18/2016 9:30 AM    To contact Stroke Continuity provider, please refer to [email or web address]. After hours, contact General Neurology

## 2016-07-18 NOTE — Progress Notes (Signed)
PULMONARY / CRITICAL CARE MEDICINE   Name: [name]. MRN: [id number] DOB: 1951/05/07    ADMISSION DATE:  07/10/2016 CONSULTATION DATE:  07/11/2016  REFERRING MD:  Perrin Maltese - Chambliss  CHIEF COMPLAINT:  AMS  HPI:  66 y.o. male with PMH as below, which is significant for COPD, DM, Hepatitis, HTN, Seizures, OSA, and cardiomyopathy. He was admitted 1/8 by FPTS for HHNK, PNA, and possible CVA after presenting with 8 days AMS and eventually was found sitting outside in the freezing temperature in his underwear. Since time of admission his mentation continued to worsen and he was placed on BiPAP.   SUBJECTIVE: Patient's sodium progressively increased overnight. Dextrose infusion was ultimately restarted and rate was increased to 75 mL per hour. No other acute events overnight. Patient didn't tolerate Precedex yesterday. Increased fentanyl seems to cause apnea.   REVIEW OF SYSTEMS: Unable to obtain given altered mental status and intubation.   VITAL SIGNS: BP (!) 173/81   Pulse (!) 118   Temp 99.3 F (37.4 C)   Resp 16   Ht 6\' 2"  (1.88 m)   Wt 278 lb 3.5 oz (126.2 kg)   SpO2 99%   BMI 35.72 kg/m   HEMODYNAMICS:    VENTILATOR SETTINGS: Vent Mode: CPAP;PSV FiO2 (%):  [40 %] 40 % Set Rate:  [16 bmp] 16 bmp Vt Set:  [650 mL] 650 mL PEEP:  [5 cmH20] 5 cmH20 Pressure Support:  [10 cmH20] 10 cmH20 Plateau Pressure:  [14 cmH20-23 cmH20] 14 cmH20  INTAKE / OUTPUT: I/O last 3 completed shifts: In: 4435.3 [I.V.:1835.3; Other:70; NG/GT:2330; IV Piggyback:200] Out: 3925 [Urine:3250; Stool:675]  PHYSICAL EXAMINATION: General. Morbidly obese male. No distress. Wife at bedside. Integument: Warm and dry. No rash on exposed skin. HEENT: Endotracheal tube in place. No scleral icterus. Moist mucous membranes. Cardiovascular: Regular rate and rhythm. Unable to appreciate JVD given body habitus. No edema appreciated. Pulmonary: Clear bilaterally to auscultation. Symmetric chest rise on  ventilator. Abdomen: Soft. Protuberant. Normal bowel sounds. Neurological: Continues to have rightward gaze. Not following commands. Does open eyes to voice. Again spontaneously moving left arm and leg.  LABS:  BMET  Recent Labs Lab 07/17/16 2300 07/18/16 0239 07/18/16 0700  NA 156* 156* 158*  K 4.0 3.9 3.7  CL 127* 127* 124*  CO2 21* 21* 22  BUN 41* 42* 42*  CREATININE 2.15* 2.24* 2.21*  GLUCOSE 241* 207* 179*    Electrolytes  Recent Labs Lab 07/13/16 0400 07/13/16 1619  07/17/16 2300 07/18/16 0239 07/18/16 0700  CALCIUM 8.5*  --   < > 8.5* 8.7* 8.7*  MG 2.3 2.2  --   --  2.2  --   PHOS 3.2 3.4  --   --  3.3  --   < > = values in this interval not displayed.  CBC  Recent Labs Lab 07/16/16 0430 07/17/16 0451 07/18/16 0239  WBC 11.7* 9.4 10.0  HGB 11.0* 10.4* 10.4*  HCT 33.0* 31.9* 32.0*  PLT 274 294 277    Coag's No results for input(s): APTT, INR in the last 168 hours.  Sepsis Markers  Recent Labs Lab 07/12/16 0212 07/13/16 0400 07/14/16 0430  PROCALCITON 0.36 0.46 0.37    ABG  Recent Labs Lab 07/11/16 1142 07/11/16 2004 07/12/16 0030  PHART 7.288* 7.329* 7.365  PCO2ART 40.1 36.3 32.5  PO2ART 123.0* 100 196*    Liver Enzymes  Recent Labs Lab 07/17/16 0451  AST 31  ALT 20  ALKPHOS 53  BILITOT 0.3  ALBUMIN 2.1*    Cardiac Enzymes No results for input(s): TROPONINI, PROBNP in the last 168 hours.  Glucose  Recent Labs Lab 07/17/16 0814 07/17/16 1237 07/17/16 1629 07/17/16 2033 07/18/16 0017 07/18/16 0837  GLUCAP 280* 120* 189* 166* 223* 187*    Imaging No results found.  STUDIES:  CT Head 1/8: Motion degraded exam demonstrating no definite cerebral infarction or hemorrhage. Mild atrophy and small vessel disease, similar to priors. Asymmetric hypodensity of the LEFT mid pons, favored to represent artifact; if signs and symptoms of brainstem infarction are present, recommend further assessment with MR imaging when  the patient is better able to remain motionless. MRI Brain 1/10: large L MCA posterior CVA with associated edema, trace petechial hemorrhage, no mass effect; notable posterior circ stenosis, R anterior circ EEG 1/10: no active seizures, global cerebral dysfunction.   MICROBIOLOGY: MRSA PCR 1/8:  Negative  Blood Ctx x2 1/8:  1/2 Bottles Mircrococcus luteus Urine 1/8:  Insignificant growth Blood Ctx x2 1/11 >>   ANTIBIOTICS: Azithromycin 1/8 - 1/9 Acyclovir 1/10 - 1/11 Zosyn 1/8 - 1/10 Vanco 1/8 - 1/11 Rocephin 1/8 >>  SIGNIFICANT EVENTS: 1/08 - ED for 8 day history of AMS 1/09 - Intubated for inability to protect airway  1/10 - MRI shows left pos MCA cva  LINES/TUBES: OETT 8.0 1/9 >>  L IJ CVL 1/10 >> OGT 1/9 >> Foley 1/9 >>  ASSESSMENT / PLAN:  PULMONARY A: Acute Respiratory Failure - Unable to protect airway due to altered mentation. Possible Aspiration Pneumonia - LLL. OSA - Not currently on CPAP. H/O COPD - No signs of exacerbation.   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 Seizure Disorder - None seen on EEG 1/10. Acute Encephalopathy - Likely multifactorial & possible component of delirium. Ammonia 70 but trending down. H/O [place] CVA Sedation on Ventilator  P:   RASS goal: 0 to -1 Weaning Fentanyl gtt & IV prn Continuing Precedex gtt Neurology Following AEDs:  Depakene (changed from home Keppra due to nephrotoxicity) ASA 325mg  daily Repeat CT Head pending  CARDIOVASCULAR A:  Chronic Diastolic CHF - Questionable acute component.  Grossly positive fluid balance since admission. Essential Hypertension - BP labile.   P:  Continuous telemetry monitoring Vitals per unit protocol Continuing Lipitor & ASA Holding home Lisinopril & Coreg Hydralazine IV prn Holding off on Plavix until  ok with Neurology given hemorrhage on brain MRI  RENAL A:   Acute on Chronic Renal Failure Stage 3 - Baseline Creatinine 1.9. Resolved acute issue.   Hypernatremia - Worsening. H/O BPH - On Flomax.  P:   Trending electrolytes q4hr Continuing D5W @ [employer] 200cc VT q4hr Monitoring UOP Replacing electrolytes as indicated  GASTROINTESTINAL A:   GERD H/O Hepatitis C Viral Infection  P:   NPO Pepcid VT daily Continuing Tube Feedings Plan for PEG if requires a tracheostomy  HEMATOLOGIC A:   Anemia - Mild. No signs of active bleeding.  P:  Trending cell counts daily w/ CBC SCDs Heparin Stone q8hr  INFECTIOUS A:   Suspected Aspiration Pneumonia Possible Bacteremia - Likely contamination w/ repeat cultures pending.  P:   Empiric Rocephin Day #9 (total abx) Awaiting finalization of blood cultures  ENDOCRINE A:   H/O DM - Glucose controlled. Hgb A1c 12.3.  HHNK - Resolved.  P:   Accu-Checks q4hr SSI per Moderate Aglorithm Discontinuing scheduled Novolog Lantus 15u [place] BID  FAMILY  - Updates: Updated at bedside 1/16 by Dr. Jamison NeighborNestor.  - Inter-disciplinary family meet or Palliative Care meeting due by:  1/16  TODAY'S SUMMARY:  66 y.o. male with known history of multiple medical problems including COPD, seizures, and sleep apnea presenting with HHNK and altered mentation. Patient now with left MCA CVA. Mental status is the main barrier extubation. With increased fentanyl he seems to be having apneic spells but without it he remains significantly agitated. Unable to tolerate Precedex. Believe tracheostomy will help the patient weaning further and will be necessary given his duration of intubation. I discussed this at length with his wife at bedside today.  I have personally spent a total of 32 minutes of critical care time today caring for the patient, updating the patient's wife at bedside as well as discussing tracheostomy, and reviewing the patient's  electronic medical record.  Donna ChristenJennings E. Jamison NeighborNestor, M.D. [facility] & [facility] Pager:  [phone] After 3pm or if no response, call [phone] 11:06 AM 07/18/16

## 2016-07-19 ENCOUNTER — Inpatient Hospital Stay (HOSPITAL_COMMUNITY): Payer: Medicare PPO

## 2016-07-19 LAB — CBC WITH DIFFERENTIAL/PLATELET
BASOS PCT: 0 %
Basophils Absolute: 0 10*3/uL (ref 0.0–0.1)
EOS ABS: 0.6 10*3/uL (ref 0.0–0.7)
Eosinophils Relative: 5 %
HCT: 30.8 % — ABNORMAL LOW (ref 39.0–52.0)
Hemoglobin: 10 g/dL — ABNORMAL LOW (ref 13.0–17.0)
Lymphocytes Relative: 22 %
Lymphs Abs: 2.4 10*3/uL (ref 0.7–4.0)
MCH: 25.1 pg — ABNORMAL LOW (ref 26.0–34.0)
MCHC: 32.5 g/dL (ref 30.0–36.0)
MCV: 77.4 fL — ABNORMAL LOW (ref 78.0–100.0)
MONO ABS: 1.4 10*3/uL — AB (ref 0.1–1.0)
MONOS PCT: 13 %
NEUTROS ABS: 6.8 10*3/uL (ref 1.7–7.7)
NEUTROS PCT: 60 %
PLATELETS: 288 10*3/uL (ref 150–400)
RBC: 3.98 MIL/uL — ABNORMAL LOW (ref 4.22–5.81)
RDW: 19 % — AB (ref 11.5–15.5)
WBC: 11.3 10*3/uL — ABNORMAL HIGH (ref 4.0–10.5)

## 2016-07-19 LAB — BASIC METABOLIC PANEL
ANION GAP: 3 — AB (ref 5–15)
Anion gap: 7 (ref 5–15)
Anion gap: 6 (ref 5–15)
Anion gap: 9 (ref 5–15)
BUN: 40 mg/dL — ABNORMAL HIGH (ref 6–20)
BUN: 38 mg/dL — ABNORMAL HIGH (ref 6–20)
BUN: 39 mg/dL — AB (ref 6–20)
BUN: 45 mg/dL — AB (ref 6–20)
CALCIUM: 8.6 mg/dL — AB (ref 8.9–10.3)
CALCIUM: 8.7 mg/dL — AB (ref 8.9–10.3)
CALCIUM: 8.7 mg/dL — AB (ref 8.9–10.3)
CO2: 22 mmol/L (ref 22–32)
CO2: 22 mmol/L (ref 22–32)
CO2: 23 mmol/L (ref 22–32)
CO2: 24 mmol/L (ref 22–32)
CREATININE: 2.28 mg/dL — AB (ref 0.61–1.24)
CREATININE: 2.1 mg/dL — AB (ref 0.61–1.24)
Calcium: 8.7 mg/dL — ABNORMAL LOW (ref 8.9–10.3)
Chloride: 128 mmol/L — ABNORMAL HIGH (ref 101–111)
Chloride: 125 mmol/L — ABNORMAL HIGH (ref 101–111)
Chloride: 123 mmol/L — ABNORMAL HIGH (ref 101–111)
Chloride: 118 mmol/L — ABNORMAL HIGH (ref 101–111)
Creatinine, Ser: 2.15 mg/dL — ABNORMAL HIGH (ref 0.61–1.24)
Creatinine, Ser: 2.21 mg/dL — ABNORMAL HIGH (ref 0.61–1.24)
GFR calc Af Amer: 35 mL/min — ABNORMAL LOW (ref >60)
GFR calc Af Amer: 34 mL/min — ABNORMAL LOW (ref >60)
GFR calc Af Amer: 33 mL/min — ABNORMAL LOW (ref >60)
GFR calc Af Amer: 36 mL/min — ABNORMAL LOW (ref >60)
GFR calc non Af Amer: 30 mL/min — ABNORMAL LOW (ref >60)
GFR, EST NON AFRICAN AMERICAN: 31 mL/min — AB (ref >60)
GFR, EST NON AFRICAN AMERICAN: 28 mL/min — AB (ref >60)
GFR, EST NON AFRICAN AMERICAN: 31 mL/min — AB (ref >60)
GLUCOSE: 218 mg/dL — AB (ref 65–99)
GLUCOSE: 323 mg/dL — AB (ref 65–99)
Glucose, Bld: 265 mg/dL — ABNORMAL HIGH (ref 65–99)
Glucose, Bld: 342 mg/dL — ABNORMAL HIGH (ref 65–99)
POTASSIUM: 3.8 mmol/L (ref 3.5–5.1)
POTASSIUM: 4 mmol/L (ref 3.5–5.1)
Potassium: 3.8 mmol/L (ref 3.5–5.1)
Potassium: 3.6 mmol/L (ref 3.5–5.1)
SODIUM: 153 mmol/L — AB (ref 135–145)
SODIUM: 152 mmol/L — AB (ref 135–145)
Sodium: 154 mmol/L — ABNORMAL HIGH (ref 135–145)
Sodium: 151 mmol/L — ABNORMAL HIGH (ref 135–145)

## 2016-07-19 LAB — GLUCOSE, CAPILLARY
GLUCOSE-CAPILLARY: 266 mg/dL — AB (ref 65–99)
GLUCOSE-CAPILLARY: 354 mg/dL — AB (ref 65–99)
Glucose-Capillary: 209 mg/dL — ABNORMAL HIGH (ref 65–99)
Glucose-Capillary: 264 mg/dL — ABNORMAL HIGH (ref 65–99)
Glucose-Capillary: 287 mg/dL — ABNORMAL HIGH (ref 65–99)
Glucose-Capillary: 326 mg/dL — ABNORMAL HIGH (ref 65–99)

## 2016-07-19 LAB — VALPROIC ACID LEVEL: VALPROIC ACID LVL: 64 ug/mL (ref 50.0–100.0)

## 2016-07-19 LAB — PHOSPHORUS: PHOSPHORUS: 3.1 mg/dL (ref 2.5–4.6)

## 2016-07-19 LAB — MAGNESIUM: MAGNESIUM: 2 mg/dL (ref 1.7–2.4)

## 2016-07-19 MED ORDER — SODIUM CHLORIDE 0.9% FLUSH
10.0000 mL | Freq: Two times a day (BID) | INTRAVENOUS | Status: DC
  Administered 2016-07-19 – 2016-07-23 (×6): 10 mL

## 2016-07-19 MED ORDER — FUROSEMIDE 10 MG/ML IJ SOLN
40.0000 mg | Freq: Four times a day (QID) | INTRAMUSCULAR | Status: AC
  Administered 2016-07-19 (×3): 40 mg via INTRAVENOUS
  Filled 2016-07-19 (×3): qty 4

## 2016-07-19 MED ORDER — SODIUM CHLORIDE 0.9% FLUSH: 10.0000 mL | INTRAVENOUS | Status: DC | PRN

## 2016-07-19 MED ORDER — SODIUM CHLORIDE 0.45 % IV SOLN: INTRAVENOUS | Status: DC

## 2016-07-19 MED ORDER — DEXTROSE 5 % IV SOLN
INTRAVENOUS | Status: DC
  Administered 2016-07-19 – 2016-07-20 (×2): via INTRAVENOUS

## 2016-07-19 MED ORDER — VALPROATE SODIUM 500 MG/5ML IV SOLN
750.0000 mg | Freq: Once | INTRAVENOUS | Status: AC
  Administered 2016-07-19: 750 mg via INTRAVENOUS
  Filled 2016-07-19: qty 7.5

## 2016-07-19 MED ORDER — VALPROATE SODIUM 500 MG/5ML IV SOLN: 750.0000 mg | Freq: Once | INTRAVENOUS | Status: DC

## 2016-07-19 MED ORDER — INSULIN GLARGINE 100 UNIT/ML ~~LOC~~ SOLN
20.0000 [IU] | Freq: Two times a day (BID) | SUBCUTANEOUS | Status: DC
  Administered 2016-07-19 – 2016-07-20 (×2): 20 [IU] via SUBCUTANEOUS
  Filled 2016-07-19 (×2): qty 0.2

## 2016-07-19 MED ORDER — FREE WATER
300.0000 mL | Status: DC
  Administered 2016-07-19 – 2016-07-21 (×12): 300 mL

## 2016-07-19 NOTE — Progress Notes (Signed)
Inpatient Diabetes Program Recommendations  AACE/ADA: New Consensus Statement on Inpatient Glycemic Control (2015)  Target Ranges:  Prepandial:   less than 140 mg/dL      Peak postprandial:   less than 180 mg/dL (1-2 hours)      Critically ill patients:  140 - 180 mg/dL   Lab Results  Component Value Date   GLUCAP 264 (H) 07/19/2016   HGBA1C 12.3 (H) 07/10/2016    Review of Glycemic Control:  Results for [name], Ronald JR. (MRN [id number]) as of 07/19/2016 09:26  Ref. Range 07/18/2016 00:17 07/18/2016 08:37 07/18/2016 12:15 07/18/2016 16:08 07/18/2016 19:49 07/18/2016 23:50 07/19/2016 04:00 07/19/2016 08:27  Glucose-Capillary Latest Ref Range: 65 - 99 mg/dL 086223 (H) 578187 (H) 469210 (H) 212 (H) 224 (H) 220 (H) 209 (H) 264 (H)   Diabetes history: Type diabetes Outpatient Diabetes medications: Glucotrol 2.5 mg bid Current orders for Inpatient glycemic control:  Novolog moderate q 4 hours, Lantus 15 units bid Inpatient Diabetes Program Recommendations:    Please consider adding Novolog tube feed coverage 3 units q 4 hours-hold if tube feeds held.  Thanks, Beryl MeagerJenny Kassim Guertin, RN, BC-ADM Inpatient Diabetes Coordinator Pager [phone] (8a-5p)

## 2016-07-19 NOTE — Progress Notes (Signed)
PULMONARY / CRITICAL CARE MEDICINE   Name: Ronald FordLouis Nishida Jr. MRN: [id number] DOB: [birth year]    ADMISSION DATE:  07/10/2016 CONSULTATION DATE:  07/11/2016  REFERRING MD:  Perrin MalteseFPTS - Chambliss  CHIEF COMPLAINT:  AMS  HPI:  66 y.o. male with PMH as below, which is significant for COPD, DM, Hepatitis, HTN, Seizures, OSA, and cardiomyopathy. He was admitted 1/8 by FPTS for HHNK, PNA, and possible CVA after presenting with 8 days AMS and eventually was found sitting outside in the freezing temperature in his underwear. Since time of admission his mentation continued to worsen and he was placed on BiPAP.   SUBJECTIVE: No events overnight, worsening glucose and Na  VITAL SIGNS: BP (!) 141/91   Pulse (!) 118   Temp 100 F (37.8 C) (Oral)   Resp 16   Ht 6\' 2"  (1.88 m)   Wt 133.4 kg (294 lb 1.5 oz)   SpO2 96%   BMI 37.76 kg/m   HEMODYNAMICS:    VENTILATOR SETTINGS: Vent Mode: PSV;CPAP FiO2 (%):  [40 %] 40 % Set Rate:  [16 bmp] 16 bmp Vt Set:  [650 mL] 650 mL PEEP:  [5 cmH20] 5 cmH20 Pressure Support:  [5 cmH20] 5 cmH20 Plateau Pressure:  [19 cmH20-23 cmH20] 21 cmH20  INTAKE / OUTPUT: I/O last 3 completed shifts: In: 734875 [I.V.:3215; NG/GT:1660] Out: 4365 [Urine:4040; Stool:325]  PHYSICAL EXAMINATION: General. Morbidly obese male. No distress. Wife at bedside. HEENT: [place]/AT, PERRL, EOM-I and MMM Cardiovascular: Regular rate and rhythm. Unable to appreciate JVD given body habitus. No edema appreciated. Pulmonary: Coarse BS diffusely Abdomen: Soft. Protuberant. Normal bowel sounds. Neurological: Continues to have rightward gaze. Not following commands. Does open eyes to voice. Again spontaneously moving left arm and leg. Ext: -edema and -tenderness  LABS:  BMET  Recent Labs Lab 07/18/16 1900 07/18/16 2230 07/19/16 0540  NA 156* 153* 154*  K 3.9 3.8 3.8  CL 125* 128* 125*  CO2 21* 22 22  BUN 40* 40* 38*  CREATININE 2.15* 2.15* 2.21*  GLUCOSE 250* 218* 265*     Electrolytes  Recent Labs Lab 07/13/16 1619  07/18/16 0239  07/18/16 1900 07/18/16 2230 07/19/16 0540  CALCIUM  --   < > 8.7*  < > 8.9 8.6* 8.7*  MG 2.2  --  2.2  --   --   --  2.0  PHOS 3.4  --  3.3  --   --   --  3.1  < > = values in this interval not displayed.  CBC  Recent Labs Lab 07/17/16 0451 07/18/16 0239 07/19/16 0540  WBC 9.4 10.0 11.3*  HGB 10.4* 10.4* 10.0*  HCT 31.9* 32.0* 30.8*  PLT 294 277 288   Coag's No results for input(s): APTT, INR in the last 168 hours.  Sepsis Markers  Recent Labs Lab 07/13/16 0400 07/14/16 0430  PROCALCITON 0.46 0.37   ABG No results for input(s): PHART, PCO2ART, PO2ART in the last 168 hours.  Liver Enzymes  Recent Labs Lab 07/17/16 0451  AST 31  ALT 20  ALKPHOS 53  BILITOT 0.3  ALBUMIN 2.1*   Cardiac Enzymes No results for input(s): TROPONINI, PROBNP in the last 168 hours.  Glucose  Recent Labs Lab 07/18/16 1215 07/18/16 1608 07/18/16 1949 07/18/16 2350 07/19/16 0400 07/19/16 0827  GLUCAP 210* 212* 224* 220* 209* 264*   Imaging Ct Head Wo Contrast  Result Date: 07/18/2016 CLINICAL DATA:  Follow-up left MCA stroke.  Right-sided weakness. EXAM: CT HEAD WITHOUT CONTRAST TECHNIQUE: Contiguous  axial images were obtained from the base of the skull through the vertex without intravenous contrast. COMPARISON:  Brain MRI 07/12/2016 FINDINGS: Brain: Cytotoxic edema is again seen corresponding to the large posterior left MCA division infarct described on MRI. There is regional sulcal effacement but no midline shift. There is no evidence of significant infarct extension in the interim. Minimal petechial hemorrhage is again seen without space-occupying parenchymal hematoma. There is no evidence of new infarct, mass, or extra-axial fluid collection. Ventricles are unchanged in size. Vascular: Mild calcified atherosclerosis at the skullbase. No hyperdense vessel. Skull: No fracture or focal osseous lesion.  Sinuses/Orbits: Mild scattered mucosal thickening in the paranasal sinuses. Moderate mastoid effusions. Unremarkable orbits. Other: None. IMPRESSION: 1. Large subacute posterior left MCA infarct with minimal petechial hemorrhage. No evidence of infarct extension from the prior MRI. 2. No evidence of new intracranial abnormality. Electronically Signed   By: Sebastian Ache M.D.   On: 07/18/2016 11:25   STUDIES:  CT Head 1/8: Motion degraded exam demonstrating no definite cerebral infarction or hemorrhage. Mild atrophy and small vessel disease, similar to priors. Asymmetric hypodensity of the LEFT mid pons, favored to represent artifact; if signs and symptoms of brainstem infarction are present, recommend further assessment with MR imaging when the patient is better able to remain motionless. MRI Brain 1/10: large L MCA posterior CVA with associated edema, trace petechial hemorrhage, no mass effect; notable posterior circ stenosis, R anterior circ EEG 1/10: no active seizures, global cerebral dysfunction.   MICROBIOLOGY: MRSA PCR 1/8:  Negative  Blood Ctx x2 1/8:  1/2 Bottles Mircrococcus luteus Urine 1/8:  Insignificant growth Blood Ctx x2 1/11 >>   ANTIBIOTICS: Azithromycin 1/8 - 1/9 Acyclovir 1/10 - 1/11 Zosyn 1/8 - 1/10 Vanco 1/8 - 1/11 Rocephin 1/8 >>  SIGNIFICANT EVENTS: 1/08 - ED for 8 day history of AMS 1/09 - Intubated for inability to protect airway  1/10 - MRI shows left pos MCA cva  LINES/TUBES: OETT 8.0 1/9 >>  L IJ CVL 1/10 >> OGT 1/9 >> Foley 1/9 >>  ASSESSMENT/PLAN [name]. is a 66 y.o. male with history of uncontrolled Type II DM, Stage III CKD, HTN, HLD, seizure disorder, COPD, CVA, and chronic gout presenting with 8 day history of AMS, right sided weakness, and not communicating x 2 days. He did not receive IV t-PA due to delay in arrival.   Stroke:  Left MCA posterior division embolic infarct with cytotoxic edema secondary to unclear source  Intubated,  aphasia, R hemiparesis, R facial droop, may require trach  MRI  posterior division left MCA infarct  MRA right M2, BA, b/l VA stenosis/athero  Carotid Doppler - right ICA 60-79% stenosis and left ICA not able to see well  CT repeat 07/18/16 left MCA infarct unchanged  Will do CTA neck once Cre improved.   TEE ordered for ?endocarditis  2D Echo EF 65% to 70%  LE and right UE venous doppler - no DVT  LDL 64   HgbA1c - 12.3  Heparin subq for VTE prophylaxis  Diet NPO time specified, on TF @ 40  Clopidogrel 75 mg daily and ASA prior to admission, now on aspirin per tube for secondary stroke prevention. May consider DAPT later if pt stable and no more procedure planned.   Ongoing aggressive stroke risk factor management  Therapy recommendations:  pending   Disposition:  pending   Acute respiratory Failure  Intubated for respiratory failure  CCM on board  On CPAP now  Encephalopathy   Metabolic etiology  Related to DKA, fever, CHF, AKI, hypernatremia, leukocytosis, hyperammonemia  Treat underlying disease  CCM on board  D5W for hypernatremia  Seizure Disorder  Hx seizures  On Keppra PTA  No observed seizure activity  EEG - low voltage, slowing, no sz  Given renal failure, d/c Keppra and started depacon 750 q 8  Depakote level 116 -> 75 -> 74->77->64   Give depakote load 750mg  one time  EEG repeat low voltage diffuse slowing, no seizure  Hyperammonemia  Ammonia level 30->70->29  Continue observe  Ammonia level in am  D5W at 100 ml/hr  Lasix 40 mg IV q6 x3 doses for volume negative  Fever with leukocystosis  Currently afebrile  Still has cooling blanket  Rocephine - started 1/10 2018  LP negative for meningitis  Culture so far NGTD  WBC 27.7->18.7->12.6->15.1 -> 11.7->9.4->10.0->11.3  Repeat CXR today  Hypernatremia from hyponatremia  On TF @ 40 and 1/2NS  Free water to 300 Q4  DKA Diabetes,  uncontrolled Diabetic Neuropathy  Glucose levels still elevated  HgbA1c 12.3 , goal < 7.0  Uncontrolled  Diabetes RN following  SSI  On lantus 15 u bid, increase to 20  Hypotension Hx Hypertension  Variable with Low BP  Propofol changed to fentanyl  Long-term BP goal normotensive  AKI on CKD  Likely due to water deficit  Continue IVF and TF as above  Increased free water to 300 Q4  Hyperlipidemia  Home meds:  lipitor 80  Resume statin when able in hospital  LDL 64, goal < 70  Continue statin at discharge  Other Stroke Risk Factors  Advanced age  Obesity, Body mass index is 37.76 kg/m., recommend weight loss, diet and exercise as appropriate   Hx stroke/TIA - Details not available  Coronary artery disease  Obstructive sleep apnea, on CPAP at home  CHF - on admission CXR  Other Active Problems  hypercalcemia  COPD  Chronic gout  Chronic back pain and sciatica  BPH   The patient is critically ill with multiple organ systems failure and requires high complexity decision making for assessment and support, frequent evaluation and titration of therapies, application of advanced monitoring technologies and extensive interpretation of multiple databases.   Critical Care Time devoted to patient care services described in this note is  35  Minutes. This time reflects time of care of this signee Dr Koren Bound. This critical care time does not reflect procedure time, or teaching time or supervisory time of PA/NP/Med student/Med Resident etc but could involve care discussion time.  Alyson Reedy, M.D. [facility] Pulmonary/Critical Care Medicine. Pager: [phone]. After hours pager: [phone].

## 2016-07-19 NOTE — Progress Notes (Signed)
STROKE TEAM PROGRESS NOTE   SUBJECTIVE (INTERVAL HISTORY) No family is at the bedside. Pt condition unchanged. Spontaneous opening eyes, not following commands, intermittent agitation, left arm and leg purposeful movement on pain. Na still high at 154, afebrile overnight. On D5 but glucose were high. Will change to 1/2 NS and increase free water. Cre trending up.   OBJECTIVE Temp:  [99.3 F (37.4 C)-100 F (37.8 C)] 100 F (37.8 C) (01/17 0700) Pulse Rate:  [77-127] 118 (01/17 0902) Cardiac Rhythm: Normal sinus rhythm;Sinus tachycardia (01/17 0800) Resp:  [9-22] 16 (01/17 0902) BP: (92-152)/(52-91) 141/91 (01/17 0902) SpO2:  [95 %-100 %] 96 % (01/17 0902) FiO2 (%):  [40 %] 40 % (01/17 0856) Weight:  [294 lb 1.5 oz (133.4 kg)] 294 lb 1.5 oz (133.4 kg) (01/17 0500)  CBC:   Recent Labs Lab 07/18/16 0239 07/19/16 0540  WBC 10.0 11.3*  NEUTROABS 6.2 6.8  HGB 10.4* 10.0*  HCT 32.0* 30.8*  MCV 77.1* 77.4*  PLT 277 288    Basic Metabolic Panel:   Recent Labs Lab 07/18/16 0239  07/18/16 2230 07/19/16 0540  NA 156*  < > 153* 154*  K 3.9  < > 3.8 3.8  CL 127*  < > 128* 125*  CO2 21*  < > 22 22  GLUCOSE 207*  < > 218* 265*  BUN 42*  < > 40* 38*  CREATININE 2.24*  < > 2.15* 2.21*  CALCIUM 8.7*  < > 8.6* 8.7*  MG 2.2  --   --  2.0  PHOS 3.3  --   --  3.1  < > = values in this interval not displayed.  Lipid Panel:     Component Value Date/Time   CHOL 110 07/13/2016 0400   TRIG 152 (H) 07/13/2016 0400   HDL 16 (L) 07/13/2016 0400   CHOLHDL 6.9 07/13/2016 0400   VLDL 30 07/13/2016 0400   LDLCALC 64 07/13/2016 0400   HgbA1c:  Lab Results  Component Value Date   HGBA1C 12.3 (H) 07/10/2016   Urine Drug Screen:     Component Value Date/Time   LABOPIA NONE DETECTED 07/10/2016 2239   COCAINSCRNUR NONE DETECTED 07/10/2016 2239   LABBENZ NONE DETECTED 07/10/2016 2239   AMPHETMU NONE DETECTED 07/10/2016 2239   THCU NONE DETECTED 07/10/2016 2239   LABBARB NONE DETECTED  07/10/2016 2239      IMAGING  I have personally reviewed the radiological images below and agree with the radiology interpretations.  Ct Head Wo Contrast 07/10/2016 Motion degraded exam demonstrating no definite cerebral infarction or hemorrhage. Mild atrophy and small vessel disease, similar to priors. Asymmetric hypodensity of the LEFT mid pons, favored to represent artifact; if signs and symptoms of brainstem infarction are present, recommend further assessment with MR imaging when the patient is better able to remain motionless.   Mr Brain 76 Contrast Mr Maxine Glenn Head Wo Contrast 07/12/2016 1. Large posterior left MCA territory infarct with cytotoxic edema and trace petechial hemorrhage but no intracranial mass effect at this time.  2. No associated large vessel or left MCA branch occlusion.  3. Intracranial MRA is positive for moderate to severe posterior circulation atherosclerosis and stenosis (involving both distal vertebral arteries and the proximal basilar artery), with mild to moderate atherosclerosis and stenosis in the right anterior circulation (right supraclinoid ICA and right MCA bifurcation).   EEG This sedated EEG is abnormal due to moderate low voltage diffuse slowing of the background.  CUS - Carotid duplex attempted, limited exam due  to IV dressing and patient positioning.  Findings consistent with a 60 - 79 percent stenosis involving the proximal right internal carotid artery. Images and Doppler evaluation could be underestimated due to suboptimal scanning conditions. Left carotid arteries not imaged due to IV dressing.  TTE - Left ventricle: The cavity size was normal. Wall thickness was   normal. Systolic function was vigorous. The estimated ejection   fraction was in the range of 65% to 70%. Wall motion was normal;   there were no regional wall motion abnormalities. Doppler   parameters are consistent with abnormal left ventricular   relaxation (grade 1 diastolic  dysfunction). - Aortic valve: Discrete hyperechoic nodule seen on the right   coronary cusp is likely due to degenerative changes, but cannot   exclude small papillary fibroelastoma. Consider TEE.  Bilateral lower extremity venous duplex There is no evidence of deep or superficial vein thrombosis involving the right and left lower extremities. All visualized vessels appear patent and compressible. There is no evidence of Baker's cysts bilaterally.  Right upper extremity venous duplex There is no obvious evidence of deep or superficial vein thrombosis involving the right upper extremity. All clearly visualized vessels appear patent and compressible.   Ct Head Wo Contrast 07/18/2016 IMPRESSION: 1. Large subacute posterior left MCA infarct with minimal petechial hemorrhage. No evidence of infarct extension from the prior MRI. 2. No evidence of new intracranial abnormality.   EEG 07/18/16 Impression: This sedatedEEG is abnormal due to moderate low voltagediffuse slowing of the background. Clinical Correlation of the above findings indicates diffuse cerebral dysfunction that is non-specific in etiology and can be seen with hypoxic/ischemic injury, toxic/metabolic encephalopathies, or medication effect from Fentanyl.The absence of epileptiform discharges does not rule out a clinical diagnosis of epilepsy. If indicated, consider repeat EEG off of sedation.  Clinical correlation is advised.   PHYSICAL EXAM  Obese middle aged african [place] male who is intubated and sedated on fetanyl. Afebrile. Head is nontraumatic. Neck is supple. Cardiac exam mild tachycardia, normal rhythm. Distal pulses are well felt. Neurological Exam  Intubated on sedation, eyes open, but not blinking and agitated with restless. Does not follow commands. Eyes middle position, not blinking to visual threat. Minimal doll's eyes elicited. Pinpoint pupil size with sluggish reaction to light, positive corneal and gag. Tongue  is midline in mouth. Motor system exam LUE and LLE mild withdraw on pain 2/5. However, right hemiplegia on pain stimulation. B/l positive babinski, DTR diminished. Sensation, coordination and gait not tested.    ASSESSMENT/PLAN [name] is a 66 y.o. male with history of uncontrolled Type II DM, Stage III CKD, HTN, HLD, seizure disorder, COPD, CVA, and chronic gout presenting with 8 day history of AMS, right sided weakness, and not communicating x 2 days. He did not receive IV t-PA due to delay in arrival.   Stroke:  Left MCA posterior division embolic infarct with cytotoxic edema secondary to unclear source  Resultant  Intubated, aphasia, R hemiparesis, R facial droop  Code Stroke CT concerning for L mid pontine infarct but MRI no pontine infarct  MRI  posterior division left MCA infarct  MRA right M2, BA, b/l VA stenosis/athero  Carotid Doppler - right ICA 60-79% stenosis and left ICA not able to see well  CT repeat 07/18/16 left MCA infarct unchanged  Will do CTA neck once Cre improved.   2D Echo EF 65% to 70%. But there is AV nodule - need TEE for further eval once stable  LE  and right UE venous doppler - no DVT  LDL 64   HgbA1c - 12.3  Heparin subq for VTE prophylaxis Diet NPO time specified, on TF @ 40  clopidogrel 75 mg daily and ASA prior to admission, now on aspirin per tube for secondary stroke prevention. May consider DAPT later if pt stable and no more procedure planned.   Ongoing aggressive stroke risk factor management  Therapy recommendations:  pending   Disposition:  pending   Acute respiratory Failure  Intubated for respiratory failure  CCM on board  On CPAP now  Encephalopathy   Metabolic etiology  Related to DKA, fever, CHF, AKI, hypernatremia, leukocytosis, hyperammonemia  Treat underlying disease  CCM on board  Seizure Disorder  Hx seizures  On Keppra PTA  No observed seizure activity  EEG - low voltage, slowing, no  sz  Given renal failure, d/c Keppra and started depacon 750 q 8  Depakote level 116 -> 75 -> 74->77->64   Give depakote load 750mg  one time  EEG repeat low voltage diffuse slowing, no seizure  Hyperammonemia  Ammonia level 30->70->29  Continue observe  Ammonia level in am  Fever with leukocystosis  Currently afebrile  Still has cooling blanket  Rocephine - started 1/10 2018  LP negative for meningitis  Culture so far NGTD  WBC 27.7->18.7->12.6->15.1 -> 11.7->9.4->10.0->11.3  Repeat CXR today  Hypernatremia from hyponatremia  Na 120-135-142-147-154-155-154 -> 160-> 163->158->154  On TF @ 40 and 1/2NS  increase free water to 300 Q4  CCM on board  DKA Diabetes, uncontrolled Diabetic Neuropathy  Glucose levels still elevated  HgbA1c 12.3 , goal < 7.0  Uncontrolled  Diabetes RN following  SSI  On lantus 15u bid  Change D5 to 1/2 NS  Hypotension Hx Hypertension  Variable with Low BP  Propofol changed to fentanyl Long-term BP goal normotensive  AKI on CKD  Cre 3.92-2.95-2.36-2.01 -> 1.94->2.24->2.21->2.21  Likely due to water deficit  Continue IVF and TF  increase free water to 300 Q4  Hyperlipidemia  Home meds:  lipitor 80  Resume statin when able in hospital  LDL 64, goal < 70  Continue statin at discharge  Other Stroke Risk Factors  Advanced age  Obesity, Body mass index is 37.76 kg/m., recommend weight loss, diet and exercise as appropriate   Hx stroke/TIA - Details not available  Coronary artery disease  Obstructive sleep apnea, on CPAP at home  CHF - on admission CXR  Other Active Problems  hypercalcemia  COPD  Chronic gout  Chronic back pain and sciatica  BPH  Hospital day # 9   This patient is critically ill due to left MCA infarct, intubation, fever, sepsis, DKA, encephalopathy, seizure and at significant risk of neurological worsening, death form recurrent stroke, hemorrhagic transformation, DKA,  heart failure, status epilepticus. This patient's care requires constant monitoring of vital signs, hemodynamics, respiratory and cardiac monitoring, review of multiple databases, neurological assessment, discussion with family, other specialists and medical decision making of high complexity. I spent 45 minutes of neurocritical care time in the care of this patient.  Pt neuro unchanged, CT repeat no acute abnormalities. EEG no seizure. Still has high glucose, Na and Cre. Will increase fee water, change D5 to 1/2NS, one loading dose of depakote and check CXR. Monitoring ammonia and depakote level.  Marvel PlanJindong Remie Mathison, MD PhD Stroke Neurology 07/19/2016 9:56 AM    To contact Stroke Continuity provider, please refer to [email or web address]. After hours, contact General Neurology

## 2016-07-19 NOTE — Procedures (Deleted)
Intubation Procedure Note [name] [id number] 1951/02/27  Procedure: Intubation Indications: Airway protection and maintenance  Procedure Details Consent: Risks of procedure as well as the alternatives and risks of each were explained to the (patient/caregiver).  Consent for procedure obtained. Time Out: Verified patient identification, verified procedure, site/side was marked, verified correct patient position, special equipment/implants available, medications/allergies/relevent history reviewed, required imaging and test results available.  Performed  Maximum sterile technique was used including gloves, hand hygiene and mask.  MAC    Evaluation Hemodynamic Status: BP stable throughout; O2 sats: stable throughout Patient's Current Condition: stable Complications: No apparent complications Patient did tolerate procedure well. Chest X-ray ordered to verify placement.  CXR: pending.   [name] 07/19/2016

## 2016-07-20 ENCOUNTER — Inpatient Hospital Stay (HOSPITAL_COMMUNITY): Payer: Medicare PPO

## 2016-07-20 DIAGNOSIS — E7220 Disorder of urea cycle metabolism, unspecified: Secondary | ICD-10-CM

## 2016-07-20 LAB — POCT I-STAT 3, ART BLOOD GAS (G3+)
ACID-BASE DEFICIT: 2 mmol/L (ref 0.0–2.0)
BICARBONATE: 23.9 mmol/L (ref 20.0–28.0)
O2 Saturation: 96 %
PO2 ART: 86 mmHg (ref 83.0–108.0)
TCO2: 25 mmol/L (ref 0–100)
pCO2 arterial: 43.1 mmHg (ref 32.0–48.0)
pH, Arterial: 7.353 (ref 7.350–7.450)

## 2016-07-20 LAB — BASIC METABOLIC PANEL
ANION GAP: 8 (ref 5–15)
ANION GAP: 7 (ref 5–15)
BUN: 46 mg/dL — ABNORMAL HIGH (ref 6–20)
BUN: 47 mg/dL — ABNORMAL HIGH (ref 6–20)
CALCIUM: 8.7 mg/dL — AB (ref 8.9–10.3)
CALCIUM: 8.5 mg/dL — AB (ref 8.9–10.3)
CO2: 26 mmol/L (ref 22–32)
CO2: 24 mmol/L (ref 22–32)
CREATININE: 2.34 mg/dL — AB (ref 0.61–1.24)
Chloride: 118 mmol/L — ABNORMAL HIGH (ref 101–111)
Chloride: 120 mmol/L — ABNORMAL HIGH (ref 101–111)
Creatinine, Ser: 2.31 mg/dL — ABNORMAL HIGH (ref 0.61–1.24)
GFR, EST AFRICAN AMERICAN: 32 mL/min — AB (ref >60)
GFR, EST AFRICAN AMERICAN: 32 mL/min — AB (ref >60)
GFR, EST NON AFRICAN AMERICAN: 28 mL/min — AB (ref >60)
GFR, EST NON AFRICAN AMERICAN: 28 mL/min — AB (ref >60)
Glucose, Bld: 318 mg/dL — ABNORMAL HIGH (ref 65–99)
Glucose, Bld: 235 mg/dL — ABNORMAL HIGH (ref 65–99)
Potassium: 3.4 mmol/L — ABNORMAL LOW (ref 3.5–5.1)
Potassium: 3.4 mmol/L — ABNORMAL LOW (ref 3.5–5.1)
SODIUM: 152 mmol/L — AB (ref 135–145)
SODIUM: 151 mmol/L — AB (ref 135–145)

## 2016-07-20 LAB — GLUCOSE, CAPILLARY
GLUCOSE-CAPILLARY: 303 mg/dL — AB (ref 65–99)
GLUCOSE-CAPILLARY: 269 mg/dL — AB (ref 65–99)
GLUCOSE-CAPILLARY: 174 mg/dL — AB (ref 65–99)
GLUCOSE-CAPILLARY: 183 mg/dL — AB (ref 65–99)
GLUCOSE-CAPILLARY: 137 mg/dL — AB (ref 65–99)
GLUCOSE-CAPILLARY: 162 mg/dL — AB (ref 65–99)
Glucose-Capillary: 165 mg/dL — ABNORMAL HIGH (ref 65–99)
Glucose-Capillary: 173 mg/dL — ABNORMAL HIGH (ref 65–99)
Glucose-Capillary: 182 mg/dL — ABNORMAL HIGH (ref 65–99)
Glucose-Capillary: 183 mg/dL — ABNORMAL HIGH (ref 65–99)
Glucose-Capillary: 203 mg/dL — ABNORMAL HIGH (ref 65–99)
Glucose-Capillary: 224 mg/dL — ABNORMAL HIGH (ref 65–99)
Glucose-Capillary: 300 mg/dL — ABNORMAL HIGH (ref 65–99)
Glucose-Capillary: 315 mg/dL — ABNORMAL HIGH (ref 65–99)
Glucose-Capillary: 336 mg/dL — ABNORMAL HIGH (ref 65–99)

## 2016-07-20 LAB — CBC WITH DIFFERENTIAL/PLATELET
BASOS ABS: 0.1 10*3/uL (ref 0.0–0.1)
Basophils Relative: 1 %
EOS ABS: 0.5 10*3/uL (ref 0.0–0.7)
EOS PCT: 4 %
HCT: 33.8 % — ABNORMAL LOW (ref 39.0–52.0)
HEMOGLOBIN: 10.9 g/dL — AB (ref 13.0–17.0)
LYMPHS ABS: 2.3 10*3/uL (ref 0.7–4.0)
Lymphocytes Relative: 18 %
MCH: 24.8 pg — AB (ref 26.0–34.0)
MCHC: 32.2 g/dL (ref 30.0–36.0)
MCV: 76.8 fL — ABNORMAL LOW (ref 78.0–100.0)
Monocytes Absolute: 1.7 10*3/uL — ABNORMAL HIGH (ref 0.1–1.0)
Monocytes Relative: 13 %
NEUTROS PCT: 64 %
Neutro Abs: 8.1 10*3/uL — ABNORMAL HIGH (ref 1.7–7.7)
PLATELETS: 319 10*3/uL (ref 150–400)
RBC: 4.4 MIL/uL (ref 4.22–5.81)
RDW: 18.5 % — ABNORMAL HIGH (ref 11.5–15.5)
WBC: 12.6 10*3/uL — AB (ref 4.0–10.5)

## 2016-07-20 LAB — MAGNESIUM: Magnesium: 1.9 mg/dL (ref 1.7–2.4)

## 2016-07-20 LAB — PHOSPHORUS: PHOSPHORUS: 3.3 mg/dL (ref 2.5–4.6)

## 2016-07-20 LAB — VALPROIC ACID LEVEL: VALPROIC ACID LVL: 74 ug/mL (ref 50.0–100.0)

## 2016-07-20 MED ORDER — ALTEPLASE 2 MG IJ SOLR
2.0000 mg | Freq: Once | INTRAMUSCULAR | Status: AC
  Administered 2016-07-20: 2 mg

## 2016-07-20 MED ORDER — INSULIN GLARGINE 100 UNIT/ML ~~LOC~~ SOLN: 25.0000 [IU] | Freq: Two times a day (BID) | SUBCUTANEOUS | Status: DC

## 2016-07-20 MED ORDER — POTASSIUM CHLORIDE 20 MEQ/15ML (10%) PO SOLN
40.0000 meq | Freq: Once | ORAL | Status: AC
  Administered 2016-07-20: 40 meq
  Filled 2016-07-20: qty 30

## 2016-07-20 MED ORDER — SODIUM CHLORIDE 0.9 % IV SOLN
INTRAVENOUS | Status: DC
  Administered 2016-07-20: 15.3 [IU]/h via INTRAVENOUS
  Filled 2016-07-20: qty 2.5

## 2016-07-20 MED ORDER — SODIUM CHLORIDE 0.9 % IV SOLN
INTRAVENOUS | Status: DC
  Administered 2016-07-20: 12:00:00 via INTRAVENOUS
  Administered 2016-07-23: 10 mL/h via INTRAVENOUS

## 2016-07-20 MED ORDER — VALPROATE SODIUM 250 MG/5ML PO SOLN
750.0000 mg | Freq: Three times a day (TID) | ORAL | Status: DC
  Administered 2016-07-20 – 2016-07-21 (×3): 750 mg
  Filled 2016-07-20 (×9): qty 15

## 2016-07-20 NOTE — Progress Notes (Signed)
STROKE TEAM PROGRESS NOTE   SUBJECTIVE (INTERVAL HISTORY) No family is at the bedside. Pt condition unchanged. Spontaneous opening eyes, not following commands, intermittent agitation. Na still high at 152, afebrile overnight. On D5 but glucose were high. Recommend insulin drip.   OBJECTIVE Temp:  [96.7 F (35.9 C)-100.6 F (38.1 C)] 99 F (37.2 C) (01/18 0811) Pulse Rate:  [80-117] 103 (01/18 0900) Cardiac Rhythm: Normal sinus rhythm;Sinus tachycardia (01/18 0800) Resp:  [11-22] 22 (01/18 0900) BP: (93-180)/(56-81) 100/67 (01/18 0900) SpO2:  [94 %-98 %] 94 % (01/18 0900) FiO2 (%):  [40 %] 40 % (01/18 0800) Weight:  [274 lb 0.5 oz (124.3 kg)] 274 lb 0.5 oz (124.3 kg) (01/18 0400)  CBC:   Recent Labs Lab 07/19/16 0540 07/20/16 0500  WBC 11.3* 12.6*  NEUTROABS 6.8 8.1*  HGB 10.0* 10.9*  HCT 30.8* 33.8*  MCV 77.4* 76.8*  PLT 288 319    Basic Metabolic Panel:   Recent Labs Lab 07/19/16 0540  07/19/16 2300 07/20/16 0500  NA 154*  < > 151* 152*  K 3.8  < > 3.6 3.4*  CL 125*  < > 118* 118*  CO2 22  < > 24 26  GLUCOSE 265*  < > 323* 318*  BUN 38*  < > 45* 46*  CREATININE 2.21*  < > 2.10* 2.34*  CALCIUM 8.7*  < > 8.7* 8.7*  MG 2.0  --   --  1.9  PHOS 3.1  --   --  3.3  < > = values in this interval not displayed.  Lipid Panel:     Component Value Date/Time   CHOL 110 07/13/2016 0400   TRIG 152 (H) 07/13/2016 0400   HDL 16 (L) 07/13/2016 0400   CHOLHDL 6.9 07/13/2016 0400   VLDL 30 07/13/2016 0400   LDLCALC 64 07/13/2016 0400   HgbA1c:  Lab Results  Component Value Date   HGBA1C 12.3 (H) 07/10/2016   Urine Drug Screen:     Component Value Date/Time   LABOPIA NONE DETECTED 07/10/2016 2239   COCAINSCRNUR NONE DETECTED 07/10/2016 2239   LABBENZ NONE DETECTED 07/10/2016 2239   AMPHETMU NONE DETECTED 07/10/2016 2239   THCU NONE DETECTED 07/10/2016 2239   LABBARB NONE DETECTED 07/10/2016 2239      IMAGING  I have personally reviewed the radiological  images below and agree with the radiology interpretations.  Ct Head Wo Contrast 07/10/2016 Motion degraded exam demonstrating no definite cerebral infarction or hemorrhage. Mild atrophy and small vessel disease, similar to priors. Asymmetric hypodensity of the LEFT mid pons, favored to represent artifact; if signs and symptoms of brainstem infarction are present, recommend further assessment with MR imaging when the patient is better able to remain motionless.   Mr Brain 42 Contrast Mr Maxine Glenn Head Wo Contrast 07/12/2016 1. Large posterior left MCA territory infarct with cytotoxic edema and trace petechial hemorrhage but no intracranial mass effect at this time.  2. No associated large vessel or left MCA branch occlusion.  3. Intracranial MRA is positive for moderate to severe posterior circulation atherosclerosis and stenosis (involving both distal vertebral arteries and the proximal basilar artery), with mild to moderate atherosclerosis and stenosis in the right anterior circulation (right supraclinoid ICA and right MCA bifurcation).   EEG This sedated EEG is abnormal due to moderate low voltage diffuse slowing of the background.  CUS - Carotid duplex attempted, limited exam due to IV dressing and patient positioning.  Findings consistent with a 60 - 79 percent stenosis involving  the proximal right internal carotid artery. Images and Doppler evaluation could be underestimated due to suboptimal scanning conditions. Left carotid arteries not imaged due to IV dressing.  TTE - Left ventricle: The cavity size was normal. Wall thickness was   normal. Systolic function was vigorous. The estimated ejection   fraction was in the range of 65% to 70%. Wall motion was normal;   there were no regional wall motion abnormalities. Doppler   parameters are consistent with abnormal left ventricular   relaxation (grade 1 diastolic dysfunction). - Aortic valve: Discrete hyperechoic nodule seen on the right    coronary cusp is likely due to degenerative changes, but cannot   exclude small papillary fibroelastoma. Consider TEE.  Bilateral lower extremity venous duplex There is no evidence of deep or superficial vein thrombosis involving the right and left lower extremities. All visualized vessels appear patent and compressible. There is no evidence of Baker's cysts bilaterally.  Right upper extremity venous duplex There is no obvious evidence of deep or superficial vein thrombosis involving the right upper extremity. All clearly visualized vessels appear patent and compressible.   Ct Head Wo Contrast 07/18/2016 IMPRESSION: 1. Large subacute posterior left MCA infarct with minimal petechial hemorrhage. No evidence of infarct extension from the prior MRI. 2. No evidence of new intracranial abnormality.   EEG 07/18/16 Impression: This sedatedEEG is abnormal due to moderate low voltagediffuse slowing of the background. Clinical Correlation of the above findings indicates diffuse cerebral dysfunction that is non-specific in etiology and can be seen with hypoxic/ischemic injury, toxic/metabolic encephalopathies, or medication effect from Fentanyl.The absence of epileptiform discharges does not rule out a clinical diagnosis of epilepsy. If indicated, consider repeat EEG off of sedation.  Clinical correlation is advised.  Dg Chest Port 1 View 07/20/2016 IMPRESSION: 1.  Endotracheal tube, NG tube, left IJ line stable position. 2. Interim development of bilateral basilar pulmonary alveolar infiltrates and left-sided pleural effusion. Findings suggest congestive heart failure. Bilateral pneumonia cannot be excluded . 3. Prominence of the upper mediastinum noted, this is most likely related to prominent great vessels and or mediastinal fat. Prominent mediastinal fat noted on prior CT of 12/11/2012. Follow-up PA lateral chest x-ray is suggested for further evaluation when the patient is clinically capable.     PHYSICAL EXAM  Obese middle aged african [place] male who is intubated and sedated on fetanyl. Afebrile. Head is nontraumatic. Neck is supple. Cardiac exam mild tachycardia, normal rhythm. Distal pulses are well felt. Neurological Exam  Intubated on sedation, eyes open, but not blinking and agitated with restless. Does not follow commands. Eyes middle position, not blinking to visual threat. Minimal doll's eyes elicited. Pinpoint pupil size with sluggish reaction to light, positive corneal and gag. Tongue is midline in mouth. Motor system exam LUE and LLE mild withdraw on pain 2/5. However, right hemiplegia on pain stimulation. B/l positive babinski, DTR diminished. Sensation, coordination and gait not tested.    ASSESSMENT/Mckee Mr. Cristal FordLouis Hofmeister Jr. is a 66 y.o. male with history of uncontrolled Type II DM, Stage III CKD, HTN, HLD, seizure disorder, COPD, CVA, and chronic gout presenting with 8 day history of AMS, right sided weakness, and not communicating x 2 days. He did not receive IV t-PA due to delay in arrival.   Stroke:  Left MCA posterior division embolic infarct with cytotoxic edema secondary to unclear source  Resultant  Intubated, aphasia, R hemiparesis, R facial droop  Code Stroke CT concerning for L mid pontine infarct but MRI no pontine  infarct  MRI  posterior division left MCA infarct  MRA right M2, BA, b/l VA stenosis/athero  Carotid Doppler - right ICA 60-79% stenosis and left ICA not able to see well  CT repeat 07/18/16 left MCA infarct unchanged  Will do CTA neck once Cre improved.   2D Echo EF 65% to 70%. But there is AV nodule - need TEE for further eval once stable  LE and right UE venous doppler - no DVT  LDL 64   HgbA1c - 12.3  Heparin subq for VTE prophylaxis Diet NPO time specified, on TF @ 40  clopidogrel 75 mg daily and ASA prior to admission, now on aspirin per tube for secondary stroke prevention. May consider DAPT later if pt stable and no more  procedure planned.   Ongoing aggressive stroke risk factor management  Therapy recommendations:  pending   Disposition:  pending   Acute respiratory Failure  Intubated for respiratory failure  CCM on board  Likely need trach  Encephalopathy   Metabolic etiology  Related to DKA, fever, CHF, AKI, hypernatremia, leukocytosis, hyperammonemia  Treat underlying disease  CCM on board  CHF  CXR suggests CHF  CCM on board  Seizure Disorder  Hx seizures  On Keppra PTA  No observed seizure activity  EEG - low voltage, slowing, no sz  Given renal failure, d/c Keppra and started depacon 750 q 8  Depakote level 116 -> 75 -> 74->77->64->74  Give depakote load 750mg  one time 07/19/16  EEG repeat low voltage diffuse slowing, no seizure  Hyperammonemia  Ammonia level 30->70->29  Continue observe  Ammonia level in am  Fever with leukocystosis  Currently afebrile  Still has cooling blanket  Rocephine - started 1/10 2018  LP negative for meningitis  Culture so far NGTD  WBC 27.7->18.7->12.6->15.1 -> 11.7->9.4->10.0->11.3->12.6  Hypernatremia from hyponatremia  Na 120-135-142-147-154-155-154 -> 160-> 163->158->154->152  On TF @ 40 and D5  increase free water to 300 Q4  CCM on board  DKA Diabetes, uncontrolled Diabetic Neuropathy  Glucose levels still elevated  HgbA1c 12.3 , goal < 7.0  Uncontrolled, still hyperglycemia  Diabetes RN following  SSI  On lantus 15u bid  On D5 now  Recommend insulin drip  Hypotension Hx Hypertension  Variable with Low BP  Propofol changed to fentanyl Long-term BP goal normotensive  AKI on CKD  Cre 3.92-2.95-2.36-2.01 -> 1.94->2.24->2.21->2.21->2.34  Likely due to water deficit  Continue IVF and TF  on free water to 300 Q4  Hyperlipidemia  Home meds:  lipitor 80  Resume statin when able in hospital  LDL 64, goal < 70  Continue statin at discharge  Other Stroke Risk  Factors  Advanced age  Obesity, Body mass index is 35.18 kg/m., recommend weight loss, diet and exercise as appropriate   Hx stroke/TIA - Details not available  Coronary artery disease  Obstructive sleep apnea, on CPAP at home  Other Active Problems  hypercalcemia  COPD  Chronic gout  Chronic back pain and sciatica  BPH  Hospital day # 10    Ronald Plan, MD PhD Stroke Neurology 07/20/2016 10:10 AM    To contact Stroke Continuity provider, please refer to [email or web address]. After hours, contact General Neurology

## 2016-07-20 NOTE — Progress Notes (Signed)
[facility], per diabetes coordinator pt could benefit from insulin drip, PA made aware, will place orders.   Louie BunWilson,Lindsee Labarre S 10:50 AM

## 2016-07-20 NOTE — Progress Notes (Signed)
Dr. Kendrick FriesMcquaid made aware of patient with 4beat run vtach, K+ 3.4 after replacement, MD to place orders, will continue to monitor.   Darrel HooverWilson,Carman Essick S 4:24 PM

## 2016-07-20 NOTE — Progress Notes (Signed)
PCCM Progress Note  Name: Ronald FordLouis Eshelman Jr. MRN:   [id number] DOB:   1951/02/19         ADMISSION DATE:  07/10/2016 CONSULTATION DATE:  07/11/2016  REFERRING MD:  Holland FallingFPTS - [place]  CHIEF COMPLAINT:  AMS  HPI:   66 yo male presented 1/08 with altered mental status from HHNK, PNA, hypothermia, and CVA.  He had progressive respiratory distress and eventually needed intubation.  PMHx of COPD, DM, hepatitis, HTN, OSA, Seizures, CM.  Subjective: Tolerating pressure support.  Vital signs: BP 100/67   Pulse (!) 101   Temp 99 F (37.2 C) (Oral)   Resp 14   Ht 6\' 2"  (1.88 m)   Wt 274 lb 0.5 oz (124.3 kg)   SpO2 95%   BMI 35.18 kg/m   Intake/output: I/O last 3 completed shifts: In: 7478.6 [I.V.:3788.6; Other:40; NG/GT:3600; IV Piggyback:50] Out: 4098110110 [Urine:9435; Stool:675]  General: on vent Neuro: moves Lt side, not following commands Eyes: pupils reactive ENT: ETT in place Cardiac: regular, no murmur Chest: no wheeze Abd: soft, non tender Ext: 1+ edema Skin: no rashes   CMP Latest Ref Rng & Units 07/20/2016 07/19/2016 07/19/2016  Glucose 65 - 99 mg/dL 191(Y318(H) 782(N323(H) 562(Z342(H)  BUN 6 - 20 mg/dL 30(Q46(H) 65(H45(H) 84(O39(H)  Creatinine 0.61 - 1.24 mg/dL 9.62(X2.34(H) 5.28(U2.10(H) 1.32(G2.28(H)  Sodium 135 - 145 mmol/L 152(H) 151(H) 152(H)  Potassium 3.5 - 5.1 mmol/L 3.4(L) 3.6 4.0  Chloride 101 - 111 mmol/L 118(H) 118(H) 123(H)  CO2 22 - 32 mmol/L 26 24 23   Calcium 8.9 - 10.3 mg/dL 4.0(N8.7(L) 0.2(V8.7(L) 2.5(D8.7(L)  Total Protein 6.5 - 8.1 g/dL - - -  Total Bilirubin 0.3 - 1.2 mg/dL - - -  Alkaline Phos 38 - 126 U/L - - -  AST 15 - 41 U/L - - -  ALT 17 - 63 U/L - - -     CBC Latest Ref Rng & Units 07/20/2016 07/19/2016 07/18/2016  WBC 4.0 - 10.5 K/uL 12.6(H) 11.3(H) 10.0  Hemoglobin 13.0 - 17.0 g/dL 10.9(L) 10.0(L) 10.4(L)  Hematocrit 39.0 - 52.0 % 33.8(L) 30.8(L) 32.0(L)  Platelets 150 - 400 K/uL 319 288 277     ABG    Component Value Date/Time   PHART 7.353 07/20/2016 0640   PCO2ART 43.1 07/20/2016  0640   PO2ART 86.0 07/20/2016 0640   HCO3 23.9 07/20/2016 0640   TCO2 25 07/20/2016 0640   ACIDBASEDEF 2.0 07/20/2016 0640   O2SAT 96.0 07/20/2016 0640     CBG (last 3)   Recent Labs  07/19/16 2359 07/20/16 0408 07/20/16 0807  GLUCAP 354* 300* 303*     Imaging: Dg Chest Port 1 View  Result Date: 07/20/2016 CLINICAL DATA:  Respiratory failure. EXAM: PORTABLE CHEST 1 VIEW COMPARISON:  07/19/2016, 07/10/2016.  CT 12/11/2012. FINDINGS: Endotracheal tube, NG tube, left IJ In stable position. Stable mild prominence of the upper mediastinum noted, this may be related to great vessels and mediastinal fat. Prominent mediastinal fat noted on prior CT of 12/11/2012. PA lateral chest x-ray is suggested for further evaluation when the patient's clinically capable. Bibasilar pulmonary infiltrates and left-sided pleural effusion noted on today's exam. These changes could be related congestive heart failure. Bibasilar pneumonia cannot be excluded. No pneumothorax. IMPRESSION: 1.  Endotracheal tube, NG tube, left IJ line stable position. 2. Interim development of bilateral basilar pulmonary alveolar infiltrates and left-sided pleural effusion. Findings suggest congestive heart failure. Bilateral pneumonia cannot be excluded . 3. Prominence of the upper mediastinum noted, this is most  likely related to prominent great vessels and or mediastinal fat. Prominent mediastinal fat noted on prior CT of 12/11/2012. Follow-up PA lateral chest x-ray is suggested for further evaluation when the patient is clinically capable. Electronically Signed   By: Maisie Fus  Register   On: 07/20/2016 07:25   Dg Chest Port 1 View  Result Date: 07/19/2016 CLINICAL DATA:  66 year old male with left MCA infarct. Intubated. Congestive heart failure. Initial encounter. EXAM: PORTABLE CHEST 1 VIEW COMPARISON:  Portable chest 07/17/2016 and earlier. FINDINGS: Portable AP semi upright view at 1009 hours. The patient remains rotated to the  right. The endotracheal tube tip is stable up the level the clavicles. An enteric tube courses to the left abdomen, tip not included. Stable cardiomegaly and mediastinal contours. Mildly increased pulmonary vascular congestion. Mild veiling opacity at both lung bases has not significantly changed. No pneumothorax. No definite consolidation. IMPRESSION: 1.  Stable lines and tubes. 2. Interval increased pulmonary vascular congestion but no overt edema. 3. Veiling opacity at both lung bases not significantly changed since 07/10/2016 and may represent small pleural effusions or atelectasis. Electronically Signed   By: Odessa Fleming M.D.   On: 07/19/2016 10:20     Studies: CT head 1/08 >> atrophy, small vessel disease MRI brain 1/08 >> large Lt MCA CVA with edema EEG 1/10 >> no seizures  Antibiotics: Zithromax 1/08 >> 1/09 Acyclovir 1/10 > 1/11 Zosyn 1/08 > 1/10 Vancomycin 1/08 >> 1/11 Rocephin 1/08 >>  Cultures: Blood 1/08 >> Micrococcus luteus Blood 1/11 >>  Lines/tubes: ETT 1/09 >>  Lt IJ CVL 1/10 >>  Events: 1/08 Admit 1/09 VDRF 1/10 Found to have CVA  Summary: 66 yo male with VDRF from LV MCA CVA, HHNK, PNA.  Assessment/plan:  Acute respiratory failure with compromised airway. Hx of COPD, OSA. - pressure support wean - mental status precludes extubation trial - will need to discuss goals of care with family >> likely would need trach - duoneb   Acute metabolic encephalopathy. Lt MCA CVA. Hx of seizures. - per neurology - continue ASA - precedex for RASS goal 0 to -1 - f/u ammonia level  Micrococcus bacteremia. - continue rocephin - TEE  DM with HHNK. - insulin gtt  Hx of HTN, HLD. - lipitor  AKI with hx of CKD 3. Hypernatremia. - monitor renal fx - continue free water  DVT prophylaxis - SQ heparin SUP - pepcid Nutrition - tube feeds Goals of care - Full code  CC time 33 minutes  Coralyn Helling, MD [facility] Pulmonary/Critical Care 07/20/2016, 11:26  AM Pager:  [phone] After 3pm call: [phone]

## 2016-07-20 NOTE — Progress Notes (Signed)
PULMONARY / CRITICAL CARE MEDICINE   Name: Ronald FordLouis Takayama Jr. MRN: [id number] DOB: Apr 04, 1951    ADMISSION DATE:  07/10/2016 CONSULTATION DATE:  07/11/2016  REFERRING MD:  Holland FallingFPTS - [place]  CHIEF COMPLAINT:  AMS  HPI:   66 yo male presented 1/08 with altered mental status from HHNK, PNA, hypothermia, and CVA.  He had progressive respiratory distress and eventually needed intubation.  PMHx of COPD, DM, hepatitis, HTN, OSA, Seizures, CM.  SUBJECTIVE: He will need trach  VITAL SIGNS: BP 100/67   Pulse (!) 103   Temp 99 F (37.2 C) (Oral)   Resp (!) 22   Ht 6\' 2"  (1.88 m)   Wt 274 lb 0.5 oz (124.3 kg)   SpO2 94%   BMI 35.18 kg/m    VENTILATOR SETTINGS: Vent Mode: PRVC FiO2 (%):  [40 %] 40 % Set Rate:  [16 bmp] 16 bmp Vt Set:  [650 mL] 650 mL PEEP:  [5 cmH20] 5 cmH20 Pressure Support:  [5 cmH20] 5 cmH20 Plateau Pressure:  [21 cmH20-22 cmH20] 21 cmH20  INTAKE / OUTPUT: I/O last 3 completed shifts: In: 7478.6 [I.V.:3788.6; Other:40; NG/GT:3600; IV Piggyback:50] Out: 4098110110 [Urine:9435; Stool:675]  PHYSICAL EXAMINATION: General. Morbidly obese male. No distress.  HEENT: [place]/AT, PERRL, EOM-I and MMM Cardiovascular: Regular rate and rhythm. Unable to appreciate JVD given body habitus. No edema appreciated. Pulmonary: Coarse BS diffusely, decreased bs bases Abdomen: Soft. Protuberant. Normal bowel sounds. Neurological: Continues to have rightward gaze. Not following commands. Does open eyes to voice. Again spontaneously moving left arm and leg. Ext: -edema and -tenderness  LABS:  BMET  Recent Labs Lab 07/19/16 1413 07/19/16 2300 07/20/16 0500  NA 152* 151* 152*  K 4.0 3.6 3.4*  CL 123* 118* 118*  CO2 23 24 26   BUN 39* 45* 46*  CREATININE 2.28* 2.10* 2.34*  GLUCOSE 342* 323* 318*    Electrolytes  Recent Labs Lab 07/18/16 0239  07/19/16 0540 07/19/16 1413 07/19/16 2300 07/20/16 0500  CALCIUM 8.7*  < > 8.7* 8.7* 8.7* 8.7*  MG 2.2  --  2.0  --   --  1.9   PHOS 3.3  --  3.1  --   --  3.3  < > = values in this interval not displayed.  CBC  Recent Labs Lab 07/18/16 0239 07/19/16 0540 07/20/16 0500  WBC 10.0 11.3* 12.6*  HGB 10.4* 10.0* 10.9*  HCT 32.0* 30.8* 33.8*  PLT 277 288 319   Coag's No results for input(s): APTT, INR in the last 168 hours.  Sepsis Markers  Recent Labs Lab 07/14/16 0430  PROCALCITON 0.37   ABG  Recent Labs Lab 07/20/16 0640  PHART 7.353  PCO2ART 43.1  PO2ART 86.0    Liver Enzymes  Recent Labs Lab 07/17/16 0451  AST 31  ALT 20  ALKPHOS 53  BILITOT 0.3  ALBUMIN 2.1*   Cardiac Enzymes No results for input(s): TROPONINI, PROBNP in the last 168 hours.  Glucose  Recent Labs Lab 07/19/16 1208 07/19/16 1627 07/19/16 1934 07/19/16 2359 07/20/16 0408 07/20/16 0807  GLUCAP 266* 287* 326* 354* 300* 303*   Imaging Dg Chest Port 1 View  Result Date: 07/20/2016 CLINICAL DATA:  Respiratory failure. EXAM: PORTABLE CHEST 1 VIEW COMPARISON:  07/19/2016, 07/10/2016.  CT 12/11/2012. FINDINGS: Endotracheal tube, NG tube, left IJ In stable position. Stable mild prominence of the upper mediastinum noted, this may be related to great vessels and mediastinal fat. Prominent mediastinal fat noted on prior CT of 12/11/2012. PA lateral chest x-ray  is suggested for further evaluation when the patient's clinically capable. Bibasilar pulmonary infiltrates and left-sided pleural effusion noted on today's exam. These changes could be related congestive heart failure. Bibasilar pneumonia cannot be excluded. No pneumothorax. IMPRESSION: 1.  Endotracheal tube, NG tube, left IJ line stable position. 2. Interim development of bilateral basilar pulmonary alveolar infiltrates and left-sided pleural effusion. Findings suggest congestive heart failure. Bilateral pneumonia cannot be excluded . 3. Prominence of the upper mediastinum noted, this is most likely related to prominent great vessels and or mediastinal fat. Prominent  mediastinal fat noted on prior CT of 12/11/2012. Follow-up PA lateral chest x-ray is suggested for further evaluation when the patient is clinically capable. Electronically Signed   By: Maisie Fus  Register   On: 07/20/2016 07:25   Dg Chest Port 1 View  Result Date: 07/19/2016 CLINICAL DATA:  66 year old male with left MCA infarct. Intubated. Congestive heart failure. Initial encounter. EXAM: PORTABLE CHEST 1 VIEW COMPARISON:  Portable chest 07/17/2016 and earlier. FINDINGS: Portable AP semi upright view at 1009 hours. The patient remains rotated to the right. The endotracheal tube tip is stable up the level the clavicles. An enteric tube courses to the left abdomen, tip not included. Stable cardiomegaly and mediastinal contours. Mildly increased pulmonary vascular congestion. Mild veiling opacity at both lung bases has not significantly changed. No pneumothorax. No definite consolidation. IMPRESSION: 1.  Stable lines and tubes. 2. Interval increased pulmonary vascular congestion but no overt edema. 3. Veiling opacity at both lung bases not significantly changed since 07/10/2016 and may represent small pleural effusions or atelectasis. Electronically Signed   By: Odessa Fleming M.D.   On: 07/19/2016 10:20   STUDIES:  CT Head 1/8: Motion degraded exam demonstrating no definite cerebral infarction or hemorrhage. Mild atrophy and small vessel disease, similar to priors. Asymmetric hypodensity of the LEFT mid pons, favored to represent artifact; if signs and symptoms of brainstem infarction are present, recommend further assessment with MR imaging when the patient is better able to remain motionless. MRI Brain 1/10: large L MCA posterior CVA with associated edema, trace petechial hemorrhage, no mass effect; notable posterior circ stenosis, R anterior circ EEG 1/10: no active seizures, global cerebral dysfunction.   MICROBIOLOGY: MRSA PCR 1/8:  Negative  Blood Ctx x2 1/8:  1/2 Bottles Mircrococcus luteus Urine 1/8:   Insignificant growth Blood Ctx x2 1/11 >>   ANTIBIOTICS: Azithromycin 1/8 - 1/9 Acyclovir 1/10 - 1/11 Zosyn 1/8 - 1/10 Vanco 1/8 - 1/11 Rocephin 1/8 >>  SIGNIFICANT EVENTS: 1/08 - ED for 8 day history of AMS 1/09 - Intubated for inability to protect airway  1/10 - MRI shows left pos MCA cva  LINES/TUBES: OETT 8.0 1/9 >>  L IJ CVL 1/10 >> OGT 1/9 >> Foley 1/9 >>  DISCUSSION: 66 yo male with VDRF in setting of CVA with inability to protect airway, PNA, HHNK.  ASSESSMENT/PLAN  Mr. Amillion Macchia. is a 66 y.o. male with history of uncontrolled Type II DM, Stage III CKD, HTN, HLD, seizure disorder, COPD, CVA, and chronic gout presenting with 8 day history of AMS, right sided weakness, and not communicating x 2 days. He did not receive IV t-PA due to delay in arrival.   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   TEE ordered for ?endocarditis  2D Echo EF 65% to 70%  LE and right UE venous doppler - no DVT  LDL 64   HgbA1c - 12.3  Heparin subq for VTE prophylaxis  Diet NPO time specified, on TF @ 40  Clopidogrel 75 mg daily and ASA prior to admission, now on aspirin per tube for secondary stroke prevention. May consider DAPT later if pt stable and no more procedure planned.   Ongoing aggressive stroke risk factor management  Therapy recommendations:  pending   Disposition:  pending   Acute respiratory Failure  Intubated for respiratory failure  With cva and OSA will need trach in near future  Encephalopathy   Metabolic etiology  Related to DKA, fever, CHF, AKI, hypernatremia,  leukocytosis, hyperammonemia  Treat underlying disease  CCM on board  D5W for hypernatremia  Seizure Disorder  Hx seizures  On Keppra PTA  No observed seizure activity  EEG - low voltage, slowing, no sz  Given renal failure, d/c Keppra and started depacon 750 q 8  Depakote level 116 -> 75 -> 74->77->64   EEG repeat low voltage diffuse slowing, no seizure  Hyperammonemia  Ammonia level 30->70->29  Continue observe  D5W at 100 ml/hr  Fever with leukocystosis  Currently afebrile  Rocephine - started 1/10 2018  LP negative for meningitis  Culture so far NGTD  WBC 27.7->18.7->12.6->15.1 -> 11.7->9.4->10.0->11.3  Repeat CXR daily  Hypernatremia from hyponatremia  Recent Labs Lab 07/19/16 1413 07/19/16 2300 07/20/16 0500  NA 152* 151* 152*     On TF @ 40 and 1/2NS  Free water to 300 Q4  Hold lasix 1/18  DKA Diabetes, uncontrolled Diabetic Neuropathy CBG (last 3)   Recent Labs  07/19/16 2359 07/20/16 0408 07/20/16 0807  GLUCAP 354* 300* 303*      Glucose levels still elevated  HgbA1c 12.3 , goal < 7.0  Uncontrolled  Diabetes RN following  SSI  On lantus 15 u bid, increase to 20 1/17 increased to 25 1/18  Hypotension Hx Hypertension  Variable with Low BP  Propofol changed to fentanyl  Long-term BP goal normotensive  AKI on CKD  Lab Results  Component Value Date   CREATININE 2.34 (H) 07/20/2016   CREATININE 2.10 (H) 07/19/2016   CREATININE 2.28 (H) 07/19/2016   CREATININE 1.97 (H) 01/11/2016   CREATININE 1.97 (H) 08/12/2013   CREATININE 1.94 (H) 12/04/2012     Likely due to water deficit  Continue IVF and TF as above  Increased free water to 300 Q4  Hold lasix 1/18  Hyperlipidemia  Home meds:  lipitor 80  Resume statin when able in hospital  LDL 64, goal < 70  Continue statin at discharge  Other Stroke Risk Factors  Advanced age  Obesity, Body mass index is 37.76 kg/m., recommend  weight loss, diet and exercise as appropriate   Hx stroke/TIA - Details not available  Coronary artery disease  Obstructive sleep apnea, on CPAP at home  CHF - on admission CXR  Other Active Problems  hypercalcemia  COPD  Chronic gout  Chronic back pain and sciatica  BPH   CCT 30 min   Brett Canales Minor ACNP [facility] PCCM Pager [phone] till 3 pm If no answer page [phone] 07/20/2016, 9:58 AM

## 2016-07-20 NOTE — Progress Notes (Signed)
eLink Physician-Brief Progress Note Patient Name: Ronald FordLouis Sens Jr. DOB: 29-Aug-1950 MRN: [id number]   Date of Service  07/20/2016  HPI/Events of Note    eICU Interventions  Hypokalemia, repleted      Intervention Category Intermediate Interventions: Electrolyte abnormality - evaluation and management  Max FickleDouglas McQuaid 07/20/2016, 4:25 PM

## 2016-07-20 NOTE — Progress Notes (Signed)
Inpatient Diabetes Program Recommendations  AACE/ADA: New Consensus Statement on Inpatient Glycemic Control (2015)  Target Ranges:  Prepandial:   less than 140 mg/dL      Peak postprandial:   less than 180 mg/dL (1-2 hours)      Critically ill patients:  140 - 180 mg/dL   Lab Results  Component Value Date   GLUCAP 303 (H) 07/20/2016   HGBA1C 12.3 (H) 07/10/2016    Review of Glycemic ControlResults for Ronald FordBROWN, Ronald JR. (MRN [id number]) as of 07/20/2016 09:36  Ref. Range 07/19/2016 19:34 07/19/2016 23:59 07/20/2016 04:08 07/20/2016 08:07  Glucose-Capillary Latest Ref Range: 65 - 99 mg/dL 086326 (H) 578354 (H) 469300 (H) 303 (H)   Diabetes history: Type 2 diabetes  Inpatient Diabetes Program Recommendations:    Spoke with RN regarding patient. Recommended IV insulin/ICU glycemic control order set due to CBG's greater than 300 mg/dL.    Thanks, Beryl MeagerJenny Jibreel Fedewa, RN, BC-ADM Inpatient Diabetes Coordinator Pager [phone] (8a-5p)

## 2016-07-21 ENCOUNTER — Inpatient Hospital Stay (HOSPITAL_COMMUNITY): Payer: Medicare PPO

## 2016-07-21 ENCOUNTER — Encounter (HOSPITAL_COMMUNITY): Payer: Medicare Other

## 2016-07-21 LAB — BASIC METABOLIC PANEL
ANION GAP: 11 (ref 5–15)
Anion gap: 9 (ref 5–15)
BUN: 45 mg/dL — ABNORMAL HIGH (ref 6–20)
BUN: 46 mg/dL — ABNORMAL HIGH (ref 6–20)
CHLORIDE: 118 mmol/L — AB (ref 101–111)
CHLORIDE: 118 mmol/L — AB (ref 101–111)
CO2: 23 mmol/L (ref 22–32)
CO2: 24 mmol/L (ref 22–32)
CREATININE: 2.21 mg/dL — AB (ref 0.61–1.24)
Calcium: 8.6 mg/dL — ABNORMAL LOW (ref 8.9–10.3)
Calcium: 8.7 mg/dL — ABNORMAL LOW (ref 8.9–10.3)
Creatinine, Ser: 2.25 mg/dL — ABNORMAL HIGH (ref 0.61–1.24)
GFR calc Af Amer: 34 mL/min — ABNORMAL LOW (ref >60)
GFR calc Af Amer: 33 mL/min — ABNORMAL LOW (ref >60)
GFR calc non Af Amer: 30 mL/min — ABNORMAL LOW (ref >60)
GFR calc non Af Amer: 29 mL/min — ABNORMAL LOW (ref >60)
GLUCOSE: 143 mg/dL — AB (ref 65–99)
Glucose, Bld: 173 mg/dL — ABNORMAL HIGH (ref 65–99)
Potassium: 3.7 mmol/L (ref 3.5–5.1)
Potassium: 3.6 mmol/L (ref 3.5–5.1)
Sodium: 150 mmol/L — ABNORMAL HIGH (ref 135–145)
Sodium: 153 mmol/L — ABNORMAL HIGH (ref 135–145)

## 2016-07-21 LAB — GLUCOSE, CAPILLARY
GLUCOSE-CAPILLARY: 172 mg/dL — AB (ref 65–99)
GLUCOSE-CAPILLARY: 141 mg/dL — AB (ref 65–99)
Glucose-Capillary: 118 mg/dL — ABNORMAL HIGH (ref 65–99)
Glucose-Capillary: 133 mg/dL — ABNORMAL HIGH (ref 65–99)
Glucose-Capillary: 146 mg/dL — ABNORMAL HIGH (ref 65–99)
Glucose-Capillary: 158 mg/dL — ABNORMAL HIGH (ref 65–99)
Glucose-Capillary: 171 mg/dL — ABNORMAL HIGH (ref 65–99)
Glucose-Capillary: 176 mg/dL — ABNORMAL HIGH (ref 65–99)
Glucose-Capillary: 294 mg/dL — ABNORMAL HIGH (ref 65–99)

## 2016-07-21 LAB — CBC
HCT: 33 % — ABNORMAL LOW (ref 39.0–52.0)
HEMOGLOBIN: 10.6 g/dL — AB (ref 13.0–17.0)
MCH: 24.8 pg — AB (ref 26.0–34.0)
MCHC: 32.1 g/dL (ref 30.0–36.0)
MCV: 77.3 fL — AB (ref 78.0–100.0)
Platelets: 321 10*3/uL (ref 150–400)
RBC: 4.27 MIL/uL (ref 4.22–5.81)
RDW: 18.6 % — ABNORMAL HIGH (ref 11.5–15.5)
WBC: 13.4 10*3/uL — ABNORMAL HIGH (ref 4.0–10.5)

## 2016-07-21 LAB — VALPROIC ACID LEVEL: VALPROIC ACID LVL: 67 ug/mL (ref 50.0–100.0)

## 2016-07-21 LAB — PHOSPHORUS: Phosphorus: 2.9 mg/dL (ref 2.5–4.6)

## 2016-07-21 LAB — MAGNESIUM: Magnesium: 2 mg/dL (ref 1.7–2.4)

## 2016-07-21 LAB — PROTIME-INR
INR: 1.17
Prothrombin Time: 15 seconds (ref 11.4–15.2)

## 2016-07-21 LAB — AMMONIA: AMMONIA: 26 umol/L (ref 9–35)

## 2016-07-21 MED ORDER — POTASSIUM PHOSPHATES 15 MMOLE/5ML IV SOLN
30.0000 mmol | Freq: Once | INTRAVENOUS | Status: AC
  Administered 2016-07-21: 30 mmol via INTRAVENOUS
  Filled 2016-07-21: qty 10

## 2016-07-21 MED ORDER — SODIUM CHLORIDE 0.9 % IV SOLN
INTRAVENOUS | Status: DC
  Filled 2016-07-21: qty 2.5

## 2016-07-21 MED ORDER — INSULIN ASPART 100 UNIT/ML ~~LOC~~ SOLN
6.0000 [IU] | SUBCUTANEOUS | Status: DC
  Administered 2016-07-21: 6 [IU] via SUBCUTANEOUS

## 2016-07-21 MED ORDER — POTASSIUM CHLORIDE 20 MEQ/15ML (10%) PO SOLN: 40.0000 meq | Freq: Three times a day (TID) | ORAL | Status: DC

## 2016-07-21 MED ORDER — DEXTROSE 10 % IV SOLN: INTRAVENOUS | Status: DC | PRN

## 2016-07-21 MED ORDER — DEXTROSE 5 % IV SOLN
INTRAVENOUS | Status: DC
  Administered 2016-07-21: 10:00:00 via INTRAVENOUS

## 2016-07-21 MED ORDER — SODIUM CHLORIDE 0.9 % IV SOLN
10.0000 mg/h | INTRAVENOUS | Status: DC
  Administered 2016-07-21 – 2016-07-24 (×4): 10 mg/h via INTRAVENOUS
  Filled 2016-07-21 (×5): qty 10

## 2016-07-21 MED ORDER — INSULIN ASPART 100 UNIT/ML ~~LOC~~ SOLN
1.0000 [IU] | SUBCUTANEOUS | Status: DC
  Administered 2016-07-21: 2 [IU] via SUBCUTANEOUS

## 2016-07-21 MED ORDER — MORPHINE BOLUS VIA INFUSION
5.0000 mg | INTRAVENOUS | Status: DC | PRN
  Administered 2016-07-21 – 2016-07-24 (×4): 10 mg via INTRAVENOUS
  Filled 2016-07-21: qty 20

## 2016-07-21 MED ORDER — INSULIN GLARGINE 100 UNIT/ML ~~LOC~~ SOLN
35.0000 [IU] | Freq: Every day | SUBCUTANEOUS | Status: DC
  Administered 2016-07-21: 35 [IU] via SUBCUTANEOUS
  Filled 2016-07-21: qty 0.35

## 2016-07-21 NOTE — Progress Notes (Signed)
eLink Physician-Brief Progress Note Patient Name: Ronald FordLouis Bhandari Jr. DOB: 02/04/1951 MRN: [id number]   Date of Service  07/21/2016 X 6  HPI/Events of Note  Hyperglycemia - Currently on an insulin IV infusion at 3.9 units/hour. Blood glucose < 180 X 6.  eICU Interventions  Will order: 1. D/C Insulin IV infusion.  2. Lantus 35 units [place] Q day. 2. Q 4 hour sensitive Novolog SSI + tube feed coverage.      Intervention Category Major Interventions: Hyperglycemia - active titration of insulin therapy  Tavonna Worthington [place] 07/21/2016, 4:40 AM

## 2016-07-21 NOTE — Progress Notes (Addendum)
200cc Fentanyl wasted with Wallis BambergJennifer Cherita Hebel, RN Reola CalkinsWilson,Keisha S   Gianni Fuchs, Ilda MoriJennifer Christine

## 2016-07-21 NOTE — Progress Notes (Signed)
Patient extubated per "withdrawal of life" protocol.  Patient tolerated well.   

## 2016-07-21 NOTE — Progress Notes (Signed)
STROKE TEAM PROGRESS NOTE   SUBJECTIVE (INTERVAL HISTORY) No family is at the bedside. Pt condition unchanged. Spontaneous opening eyes, not following commands, intermittent agitation. Na still high at 153, afebrile overnight. Was on insulin drip overnight.   OBJECTIVE Temp:  [99.1 F (37.3 C)-101.9 F (38.8 C)] 101.7 F (38.7 C) (01/19 1218) Pulse Rate:  [89-136] 136 (01/19 1503) Cardiac Rhythm: Sinus tachycardia (01/19 1218) Resp:  [9-24] 24 (01/19 1503) BP: (108-189)/(53-93) 149/93 (01/19 1503) SpO2:  [90 %-98 %] 91 % (01/19 1503) FiO2 (%):  [40 %] 40 % (01/19 1503) Weight:  [274 lb 11.1 oz (124.6 kg)] 274 lb 11.1 oz (124.6 kg) (01/19 0500)  CBC:   Recent Labs Lab 07/19/16 0540 07/20/16 0500 07/21/16 0425  WBC 11.3* 12.6* 13.4*  NEUTROABS 6.8 8.1*  --   HGB 10.0* 10.9* 10.6*  HCT 30.8* 33.8* 33.0*  MCV 77.4* 76.8* 77.3*  PLT 288 319 321    Basic Metabolic Panel:   Recent Labs Lab 07/20/16 0500  07/20/16 2300 07/21/16 0425  NA 152*  < > 150* 153*  K 3.4*  < > 3.7 3.6  CL 118*  < > 118* 118*  CO2 26  < > 23 24  GLUCOSE 318*  < > 173* 143*  BUN 46*  < > 45* 46*  CREATININE 2.34*  < > 2.21* 2.25*  CALCIUM 8.7*  < > 8.6* 8.7*  MG 1.9  --   --  2.0  PHOS 3.3  --   --  2.9  < > = values in this interval not displayed.  Lipid Panel:     Component Value Date/Time   CHOL 110 07/13/2016 0400   TRIG 152 (H) 07/13/2016 0400   HDL 16 (L) 07/13/2016 0400   CHOLHDL 6.9 07/13/2016 0400   VLDL 30 07/13/2016 0400   LDLCALC 64 07/13/2016 0400   HgbA1c:  Lab Results  Component Value Date   HGBA1C 12.3 (H) 07/10/2016   Urine Drug Screen:     Component Value Date/Time   LABOPIA NONE DETECTED 07/10/2016 2239   COCAINSCRNUR NONE DETECTED 07/10/2016 2239   LABBENZ NONE DETECTED 07/10/2016 2239   AMPHETMU NONE DETECTED 07/10/2016 2239   THCU NONE DETECTED 07/10/2016 2239   LABBARB NONE DETECTED 07/10/2016 2239      IMAGING  I have personally reviewed the  radiological images below and agree with the radiology interpretations.  Ct Head Wo Contrast 07/10/2016 Motion degraded exam demonstrating no definite cerebral infarction or hemorrhage. Mild atrophy and small vessel disease, similar to priors. Asymmetric hypodensity of the LEFT mid pons, favored to represent artifact; if signs and symptoms of brainstem infarction are present, recommend further assessment with MR imaging when the patient is better able to remain motionless.   Mr Brain 44 Contrast Mr Maxine Glenn Head Wo Contrast 07/12/2016 1. Large posterior left MCA territory infarct with cytotoxic edema and trace petechial hemorrhage but no intracranial mass effect at this time.  2. No associated large vessel or left MCA branch occlusion.  3. Intracranial MRA is positive for moderate to severe posterior circulation atherosclerosis and stenosis (involving both distal vertebral arteries and the proximal basilar artery), with mild to moderate atherosclerosis and stenosis in the right anterior circulation (right supraclinoid ICA and right MCA bifurcation).   EEG This sedated EEG is abnormal due to moderate low voltage diffuse slowing of the background.  CUS - Carotid duplex attempted, limited exam due to IV dressing and patient positioning.  Findings consistent with a 60 -  79 percent stenosis involving the proximal right internal carotid artery. Images and Doppler evaluation could be underestimated due to suboptimal scanning conditions. Left carotid arteries not imaged due to IV dressing.  TTE - Left ventricle: The cavity size was normal. Wall thickness was   normal. Systolic function was vigorous. The estimated ejection   fraction was in the range of 65% to 70%. Wall motion was normal;   there were no regional wall motion abnormalities. Doppler   parameters are consistent with abnormal left ventricular   relaxation (grade 1 diastolic dysfunction). - Aortic valve: Discrete hyperechoic nodule seen on the  right   coronary cusp is likely due to degenerative changes, but cannot   exclude small papillary fibroelastoma. Consider TEE.  Bilateral lower extremity venous duplex There is no evidence of deep or superficial vein thrombosis involving the right and left lower extremities. All visualized vessels appear patent and compressible. There is no evidence of Baker's cysts bilaterally.  Right upper extremity venous duplex There is no obvious evidence of deep or superficial vein thrombosis involving the right upper extremity. All clearly visualized vessels appear patent and compressible.   Ct Head Wo Contrast 07/18/2016 IMPRESSION: 1. Large subacute posterior left MCA infarct with minimal petechial hemorrhage. No evidence of infarct extension from the prior MRI. 2. No evidence of new intracranial abnormality.   EEG 07/18/16 Impression: This sedatedEEG is abnormal due to moderate low voltagediffuse slowing of the background. Clinical Correlation of the above findings indicates diffuse cerebral dysfunction that is non-specific in etiology and can be seen with hypoxic/ischemic injury, toxic/metabolic encephalopathies, or medication effect from Fentanyl.The absence of epileptiform discharges does not rule out a clinical diagnosis of epilepsy. If indicated, consider repeat EEG off of sedation.  Clinical correlation is advised.  Dg Chest Port 1 View 07/20/2016 IMPRESSION: 1.  Endotracheal tube, NG tube, left IJ line stable position. 2. Interim development of bilateral basilar pulmonary alveolar infiltrates and left-sided pleural effusion. Findings suggest congestive heart failure. Bilateral pneumonia cannot be excluded . 3. Prominence of the upper mediastinum noted, this is most likely related to prominent great vessels and or mediastinal fat. Prominent mediastinal fat noted on prior CT of 12/11/2012. Follow-up PA lateral chest x-ray is suggested for further evaluation when the patient is clinically  capable.    PHYSICAL EXAM  Obese middle aged african [place] male who is intubated and sedated on fetanyl. Afebrile. Head is nontraumatic. Neck is supple. Cardiac exam mild tachycardia, normal rhythm. Distal pulses are well felt. Neurological Exam  Intubated on sedation, eyes open, but not blinking and agitated with restless. Does not follow commands. Eyes middle position, not blinking to visual threat. Minimal doll's eyes elicited. Pinpoint pupil size with sluggish reaction to light, positive corneal and gag. Tongue is midline in mouth. Motor system exam LUE and LLE mild withdraw on pain 2/5. However, right hemiplegia on pain stimulation. B/l positive babinski, DTR diminished. Sensation, coordination and gait not tested.    ASSESSMENT/PLAN [name] is a 66 y.o. male with history of uncontrolled Type II DM, Stage III CKD, HTN, HLD, seizure disorder, COPD, CVA, and chronic gout presenting with 8 day history of AMS, right sided weakness, and not communicating x 2 days. He did not receive IV t-PA due to delay in arrival.   Stroke:  Left MCA posterior division embolic infarct with cytotoxic edema secondary to unclear source  Resultant  Intubated, aphasia, R hemiparesis, R facial droop  Code Stroke CT concerning for L mid pontine infarct  but MRI no pontine infarct  MRI  posterior division left MCA infarct  MRA right M2, BA, b/l VA stenosis/athero  Carotid Doppler - right ICA 60-79% stenosis and left ICA not able to see well  CT repeat 07/18/16 left MCA infarct unchanged  Will do CTA neck once Cre improved.   2D Echo EF 65% to 70%. But there is AV nodule - need TEE for further eval once stable  LE and right UE venous doppler - no DVT  LDL 64   HgbA1c - 12.3  Heparin subq for VTE prophylaxis Diet NPO time specified, on TF @ 40  clopidogrel 75 mg daily and ASA prior to admission, now on aspirin per tube for secondary stroke prevention. May consider DAPT later if pt stable  and no more procedure planned.   Ongoing aggressive stroke risk factor management  Therapy recommendations:  pending   Disposition:  pending   Acute respiratory Failure  Intubated for respiratory failure  CCM on board  Likely need trach  Encephalopathy   Metabolic etiology  Related to DKA, fever, CHF, AKI, hypernatremia, leukocytosis, hyperammonemia  Treat underlying disease  CCM on board  CHF  CXR suggests CHF  CCM on board  Seizure Disorder  Hx seizures  On Keppra PTA  No observed seizure activity  EEG - low voltage, slowing, no sz  Given renal failure, d/c Keppra and started depacon 750 q 8  Depakote level 116 -> 75 -> 74->77->64->74->67  Give depakote load 750mg  one time 07/19/16  EEG repeat low voltage diffuse slowing, no seizure  Hyperammonemia  Ammonia level 30->70->29->26  Continue observe  Fever with leukocystosis  Currently afebrile  Still has cooling blanket  Rocephine - started 1/10 2018  LP negative for meningitis  Culture so far NGTD  WBC 27.7->18.7->12.6->15.1 -> 11.7->9.4->10.0->11.3->12.6->13.4  Hypernatremia from hyponatremia  Na 120-135-142-147-154-155-154 -> 160-> 163->158->154->152->153  On TF @ 40 and D5  increase free water to 300 Q4  CCM on board  DKA Diabetes, uncontrolled Diabetic Neuropathy  Glucose levels still elevated  HgbA1c 12.3 , goal < 7.0  Uncontrolled, still hyperglycemia  Diabetes RN following  SSI  On lantus 15u bid  On D5 now  Was on insulin drip overnight, recommend continued insulin drip  Hypotension Hx Hypertension  Variable with Low BP  Propofol changed to fentanyl Long-term BP goal normotensive  AKI on CKD  Cre 3.92-2.95-2.36-2.01 -> 1.94->2.24->2.21->2.21->2.34->2.25  Likely due to water deficit  Continue IVF and TF  on free water to 300 Q4  Hyperlipidemia  Home meds:  lipitor 80  Resume statin when able in hospital  LDL 64, goal < 70  Continue  statin at discharge  Other Stroke Risk Factors  Advanced age  Obesity, Body mass index is 35.27 kg/m., recommend weight loss, diet and exercise as appropriate   Hx stroke/TIA - Details not available  Coronary artery disease  Obstructive sleep apnea, on CPAP at home  Other Active Problems  hypercalcemia  COPD  Chronic gout  Chronic back pain and sciatica  BPH  Hospital day # 11    Marvel PlanJindong Jeremiyah Cullens, MD PhD Stroke Neurology 07/21/2016 12:33 PM  ADDENDUM:  CCM have further discussion with family regarding future treatment plan. After lengthy discussion, family decided DO NOT RESUSCITATE and comfort care measures. Currently patient was extubated and on morphine drip. Neurology will sign off. Please call with questions. Thanks for the consult.  Marvel PlanJindong Ronald Odonnel, MD PhD Stroke Neurology 07/21/2016 6:36 PM   To contact Stroke Continuity provider, please  refer to [email or web address] After hours, contact General Neurology

## 2016-07-21 NOTE — Progress Notes (Signed)
   07/21/16 1535  Clinical Encounter Type  Visited With Patient and family together  Visit Type Follow-up;Patient actively dying  Referral From Nurse  Consult/Referral To Chaplain  Spiritual Encounters  Spiritual Needs Prayer;Emotional  Stress Factors  Family Stress Factors Family relationships  Pt. Will possibly be extubated, family is together. Chaplain rendered a prayer, emotional support, and ministry of presence.  Advised family to contact pastoral spiritual care if further assistance is needed.   Chaplain Ranvir Renovato A. Lazar Tierce,  MA-PC , BA-REL/PHIL,  [phone]

## 2016-07-21 NOTE — Progress Notes (Signed)
Spoke with wife this AM regarding trach and review of the case.  Wife became very distraught and called the rest of the family in.  Called by family for meeting regarding plan of care.  Wife, children and brother are all present.  After a long discussion and clarification of a lot of misconceptions decision was made to proceed with DNR status and comfort care.  No trach/peg.  Will place withdrawal order set and spoke with nurse and RT, will extubate only when family is ready.  The patient is critically ill with multiple organ systems failure and requires high complexity decision making for assessment and support, frequent evaluation and titration of therapies, application of advanced monitoring technologies and extensive interpretation of multiple databases.   Critical Care Time devoted to patient care services described in this note is  60  Minutes. This time reflects time of care of this signee Dr Koren BoundWesam Yacoub. This critical care time does not reflect procedure time, or teaching time or supervisory time of PA/NP/Med student/Med Resident etc but could involve care discussion time.  Alyson ReedyWesam G. Yacoub, M.D. [facility] Pulmonary/Critical Care Medicine. Pager: [phone]. After hours pager: [phone].

## 2016-07-21 NOTE — Progress Notes (Signed)
PCCM Progress Note  Name: [name]. MRN:   [id number] DOB:   02-16-1951         ADMISSION DATE:  07/10/2016 CONSULTATION DATE:  07/11/2016  REFERRING MD:  Holland Falling  CHIEF COMPLAINT:  AMS  HPI:   66 yo male presented 1/08 with altered mental status from HHNK, PNA, hypothermia, and CVA.  He had progressive respiratory distress and eventually needed intubation.  PMHx of COPD, DM, hepatitis, HTN, OSA, Seizures, CM.  Subjective: Weaning well but remains unresponsive  Vital signs: BP (!) 133/93   Pulse 91   Temp 99.9 F (37.7 C) (Oral)   Resp 14   Ht 6\' 2"  (1.88 m)   Wt 124.6 kg (274 lb 11.1 oz)   SpO2 96%   BMI 35.27 kg/m   Intake/output: I/O last 3 completed shifts: In: 6791.3 [I.V.:2011.3; Other:110; NG/GT:4670] Out: 7285 [Urine:6410; Stool:875]  General: on vent Neuro: moves Lt side, not following commands Eyes: pupils reactive ENT: ETT in place Cardiac: regular, no murmur Chest: no wheeze Abd: soft, non tender Ext: 1+ edema Skin: no rashes   CMP Latest Ref Rng & Units 07/21/2016 07/20/2016 07/20/2016  Glucose 65 - 99 mg/dL 409(W) 119(J) 478(G)  BUN 6 - 20 mg/dL 95(A) 21(H) 08(M)  Creatinine 0.61 - 1.24 mg/dL 5.78(I) 6.96(E) 9.52(W)  Sodium 135 - 145 mmol/L 153(H) 150(H) 151(H)  Potassium 3.5 - 5.1 mmol/L 3.6 3.7 3.4(L)  Chloride 101 - 111 mmol/L 118(H) 118(H) 120(H)  CO2 22 - 32 mmol/L 24 23 24   Calcium 8.9 - 10.3 mg/dL 4.1(L) 2.4(M) 0.1(U)  Total Protein 6.5 - 8.1 g/dL - - -  Total Bilirubin 0.3 - 1.2 mg/dL - - -  Alkaline Phos 38 - 126 U/L - - -  AST 15 - 41 U/L - - -  ALT 17 - 63 U/L - - -   CBC Latest Ref Rng & Units 07/21/2016 07/20/2016 07/19/2016  WBC 4.0 - 10.5 K/uL 13.4(H) 12.6(H) 11.3(H)  Hemoglobin 13.0 - 17.0 g/dL 10.6(L) 10.9(L) 10.0(L)  Hematocrit 39.0 - 52.0 % 33.0(L) 33.8(L) 30.8(L)  Platelets 150 - 400 K/uL 321 319 288   ABG    Component Value Date/Time   PHART 7.353 07/20/2016 0640   PCO2ART 43.1 07/20/2016 0640    PO2ART 86.0 07/20/2016 0640   HCO3 23.9 07/20/2016 0640   TCO2 25 07/20/2016 0640   ACIDBASEDEF 2.0 07/20/2016 0640   O2SAT 96.0 07/20/2016 0640   CBG (last 3)   Recent Labs  07/21/16 0505 07/21/16 0603 07/21/16 0843  GLUCAP 133* 118* 176*   Imaging: Dg Chest Port 1 View  Result Date: 07/21/2016 CLINICAL DATA:  Hypoxia EXAM: PORTABLE CHEST 1 VIEW COMPARISON:  July 20, 2016 FINDINGS: Endotracheal tube tip is 4.1 cm above the carina. Central catheter tip is in the left innominate vein. Nasogastric tube tip and side port are below the diaphragm. No pneumothorax. There is a left pleural effusion with left base atelectasis. There is somewhat ill-defined patchy opacity in the right base, likely a degree of interstitial edema. Lungs elsewhere clear. Heart is mildly enlarged with pulmonary vascularity within normal limits. No evident adenopathy. No bone lesions. IMPRESSION: Persistent presumed edema right base. Left pleural effusion with left base atelectasis noted. Lungs elsewhere clear. Stable cardiomegaly. Tube and catheter positions as described without evident pneumothorax. Electronically Signed   By: Bretta Bang III M.D.   On: 07/21/2016 07:34   Dg Chest Port 1 View  Result Date: 07/20/2016 CLINICAL DATA:  Respiratory  failure. EXAM: PORTABLE CHEST 1 VIEW COMPARISON:  07/19/2016, 07/10/2016.  CT 12/11/2012. FINDINGS: Endotracheal tube, NG tube, left IJ In stable position. Stable mild prominence of the upper mediastinum noted, this may be related to great vessels and mediastinal fat. Prominent mediastinal fat noted on prior CT of 12/11/2012. PA lateral chest x-ray is suggested for further evaluation when the patient's clinically capable. Bibasilar pulmonary infiltrates and left-sided pleural effusion noted on today's exam. These changes could be related congestive heart failure. Bibasilar pneumonia cannot be excluded. No pneumothorax. IMPRESSION: 1.  Endotracheal tube, NG tube, left IJ  line stable position. 2. Interim development of bilateral basilar pulmonary alveolar infiltrates and left-sided pleural effusion. Findings suggest congestive heart failure. Bilateral pneumonia cannot be excluded . 3. Prominence of the upper mediastinum noted, this is most likely related to prominent great vessels and or mediastinal fat. Prominent mediastinal fat noted on prior CT of 12/11/2012. Follow-up PA lateral chest x-ray is suggested for further evaluation when the patient is clinically capable. Electronically Signed   By: Maisie Fushomas  Register   On: 07/20/2016 07:25   Dg Chest Port 1 View  Result Date: 07/19/2016 CLINICAL DATA:  66 year old male with left MCA infarct. Intubated. Congestive heart failure. Initial encounter. EXAM: PORTABLE CHEST 1 VIEW COMPARISON:  Portable chest 07/17/2016 and earlier. FINDINGS: Portable AP semi upright view at 1009 hours. The patient remains rotated to the right. The endotracheal tube tip is stable up the level the clavicles. An enteric tube courses to the left abdomen, tip not included. Stable cardiomegaly and mediastinal contours. Mildly increased pulmonary vascular congestion. Mild veiling opacity at both lung bases has not significantly changed. No pneumothorax. No definite consolidation. IMPRESSION: 1.  Stable lines and tubes. 2. Interval increased pulmonary vascular congestion but no overt edema. 3. Veiling opacity at both lung bases not significantly changed since 07/10/2016 and may represent small pleural effusions or atelectasis. Electronically Signed   By: Odessa FlemingH  Hall M.D.   On: 07/19/2016 10:20   Studies: CT head 1/08 >> atrophy, small vessel disease MRI brain 1/08 >> large Lt MCA CVA with edema EEG 1/10 >> no seizures  Antibiotics: Zithromax 1/08 >> 1/09 Acyclovir 1/10 > 1/11 Zosyn 1/08 > 1/10 Vancomycin 1/08 >> 1/11 Rocephin 1/08 >>1/18  Cultures: Blood 1/08 >> Micrococcus luteus Blood 1/11 >>NTD  Lines/tubes: ETT 1/09 >>  Lt IJ CVL 1/10  >>  Events: 1/08 Admit 1/09 VDRF 1/10 Found to have CVA  Summary: 66 yo male with VDRF from LV MCA CVA, HHNK, PNA.  Assessment/plan:  Acute respiratory failure with compromised airway and poor neuro status. Hx of COPD, OSA. - Pressure support wean - Mental status precludes extubation trial - Wife to arrive today for discussion regarding trach/peg. - Duoneb  - PRN albuterol  Acute metabolic encephalopathy. Lt MCA CVA. Hx of seizures. - Per neurology - Continue ASA - D/C precedex - F/u ammonia level  Micrococcus bacteremia. - D/C rocephin on 1/18, course complete - TEE pending  DM with HHNK. - Insulin gtt  Hx of HTN, HLD. - lipitor  AKI with hx of CKD 3. Hypernatremia. Hypernatremia - Monitor renal fx - Continue free water - D5W 50 ml/hr - Hold diureses today  DVT prophylaxis - SQ heparin SUP - pepcid Nutrition - tube feeds Goals of care - Full code  The patient is critically ill with multiple organ systems failure and requires high complexity decision making for assessment and support, frequent evaluation and titration of therapies, application of advanced monitoring technologies and  extensive interpretation of multiple databases.   Critical Care Time devoted to patient care services described in this note is  35  Minutes. This time reflects time of care of this signee Dr [name]. This critical care time does not reflect procedure time, or teaching time or supervisory time of PA/NP/Med student/Med Resident etc but could involve care discussion time.  Rush Farmer, M.D. [facility] Pulmonary/Critical Care Medicine. Pager: [phone]. After hours pager: [phone].

## 2016-07-21 NOTE — Progress Notes (Signed)
   07/21/16 1200  Clinical Encounter Type  Visited With Patient and family together  Visit Type Follow-up  Spiritual Encounters  Spiritual Needs Emotional  Stress Factors  Patient Stress Factors None identified  Family Stress Factors Family relationships  Paged by nursing on distraught wife who received prognosis on husband. Unable to engage with wife who was not responding by talking with other family members on phone. Discussed with nursing when spouse is calmed if services requested. Available as needed.

## 2016-07-22 ENCOUNTER — Inpatient Hospital Stay (HOSPITAL_COMMUNITY): Payer: Medicare PPO

## 2016-07-22 NOTE — Progress Notes (Signed)
Family Medicine Teaching service continues to follow along. We will be happy to resume care if Mr [name] ever becomes medically stable

## 2016-07-22 NOTE — Progress Notes (Signed)
Verbal order given to discontinue cardiac monitoring.  

## 2016-07-22 NOTE — Progress Notes (Signed)
Pt transferred from [facility] for palliative care. MD paged to verify cardiac monitoring order. Waiting for return call

## 2016-07-22 NOTE — Progress Notes (Signed)
PCP Note  Went to go visit the patient while covering the FPTS this weekend. No family at bedside. This is an unfortunate situation. [name] was a very pleasant, family-oriented, God-fearing man. Would love to meet up with the family and offer support when they arrive at the hospital today. Please let me know if I can be of any assistance. My personal pager [phone].   Caryl AdaJazma Arne Schlender, DO 07/22/2016, 9:42 AM

## 2016-07-22 NOTE — Progress Notes (Signed)
PCCM Progress Note  Name: Ronald FordLouis Vaeth Jr. MRN:   [id number] DOB:   1951-06-11         ADMISSION DATE:  07/10/2016 CONSULTATION DATE:  07/11/2016  REFERRING MD:  Holland FallingFPTS - [place]  CHIEF COMPLAINT:  AMS  HPI:   66 yo male presented 1/08 with altered mental status from HHNK, PNA, hypothermia, and CVA.  He had progressive respiratory distress and eventually needed intubation.  PMHx of COPD, DM, hepatitis, HTN, OSA, Seizures, CM.  Subjective: Terminal wean yesterday.  Now comfort care, appears comfortable on morphine at 10mg /hr.   Vital signs: BP (!) 149/93   Pulse 95   Temp (!) 101.6 F (38.7 C) (Oral) Comment: CCM made aware, no new orders  Resp 12   Ht 6\' 2"  (1.88 m)   Wt 123.5 kg (272 lb 4.3 oz)   SpO2 92%   BMI 34.96 kg/m   Intake/output: I/O last 3 completed shifts: In: 3470.2 [I.V.:880.2; Other:40; NG/GT:2040; IV Piggyback:510] Out: 3670 [Urine:3295; Stool:375]  General: comfortable on morphine  Neuro: obtunded, does not follow commands  ENT: mm moist  Cardiac: s1s2 rrr  Chest: resps even, non labored, some snoring, minimal secretions  Abd: soft, non tender Ext: 1+ edema Skin: no rashes   CMP Latest Ref Rng & Units 07/21/2016 07/20/2016 07/20/2016  Glucose 65 - 99 mg/dL 829(F143(H) 621(H173(H) 086(V235(H)  BUN 6 - 20 mg/dL 78(I46(H) 69(G45(H) 29(B47(H)  Creatinine 0.61 - 1.24 mg/dL 2.84(X2.25(H) 3.24(M2.21(H) 0.10(U2.31(H)  Sodium 135 - 145 mmol/L 153(H) 150(H) 151(H)  Potassium 3.5 - 5.1 mmol/L 3.6 3.7 3.4(L)  Chloride 101 - 111 mmol/L 118(H) 118(H) 120(H)  CO2 22 - 32 mmol/L 24 23 24   Calcium 8.9 - 10.3 mg/dL 7.2(Z8.7(L) 3.6(U8.6(L) 4.4(I8.5(L)  Total Protein 6.5 - 8.1 g/dL - - -  Total Bilirubin 0.3 - 1.2 mg/dL - - -  Alkaline Phos 38 - 126 U/L - - -  AST 15 - 41 U/L - - -  ALT 17 - 63 U/L - - -   CBC Latest Ref Rng & Units 07/21/2016 07/20/2016 07/19/2016  WBC 4.0 - 10.5 K/uL 13.4(H) 12.6(H) 11.3(H)  Hemoglobin 13.0 - 17.0 g/dL 10.6(L) 10.9(L) 10.0(L)  Hematocrit 39.0 - 52.0 % 33.0(L) 33.8(L) 30.8(L)   Platelets 150 - 400 K/uL 321 319 288   ABG    Component Value Date/Time   PHART 7.353 07/20/2016 0640   PCO2ART 43.1 07/20/2016 0640   PO2ART 86.0 07/20/2016 0640   HCO3 23.9 07/20/2016 0640   TCO2 25 07/20/2016 0640   ACIDBASEDEF 2.0 07/20/2016 0640   O2SAT 96.0 07/20/2016 0640   CBG (last 3)   Recent Labs  07/21/16 0603 07/21/16 0843 07/21/16 1238  GLUCAP 118* 176* 294*   Imaging: Dg Chest Port 1 View  Result Date: 07/22/2016 CLINICAL DATA:  Respiratory difficulty EXAM: PORTABLE CHEST 1 VIEW COMPARISON:  Yesterday FINDINGS: Endotracheal and NG tubes removed. Left jugular central venous catheter is stable. Bibasilar consolidation is worse. Left pleural effusion is stable. No pneumothorax. IMPRESSION: Extubated. Worsening bibasilar airspace disease. Stable left pleural effusion. Electronically Signed   By: Jolaine ClickArthur  Hoss M.D.   On: 07/22/2016 08:05   Dg Chest Port 1 View  Result Date: 07/21/2016 CLINICAL DATA:  Hypoxia EXAM: PORTABLE CHEST 1 VIEW COMPARISON:  July 20, 2016 FINDINGS: Endotracheal tube tip is 4.1 cm above the carina. Central catheter tip is in the left innominate vein. Nasogastric tube tip and side port are below the diaphragm. No pneumothorax. There is a left pleural effusion with  left base atelectasis. There is somewhat ill-defined patchy opacity in the right base, likely a degree of interstitial edema. Lungs elsewhere clear. Heart is mildly enlarged with pulmonary vascularity within normal limits. No evident adenopathy. No bone lesions. IMPRESSION: Persistent presumed edema right base. Left pleural effusion with left base atelectasis noted. Lungs elsewhere clear. Stable cardiomegaly. Tube and catheter positions as described without evident pneumothorax. Electronically Signed   By: Bretta Bang III M.D.   On: 07/21/2016 07:34   Studies: CT head 1/08 >> atrophy, small vessel disease MRI brain 1/08 >> large Lt MCA CVA with edema EEG 1/10 >> no  seizures  Antibiotics: Zithromax 1/08 >> 1/09 Acyclovir 1/10 > 1/11 Zosyn 1/08 > 1/10 Vancomycin 1/08 >> 1/11 Rocephin 1/08 >>1/18  Cultures: Blood 1/08 >> Micrococcus luteus Blood 1/11 >>NTD  Lines/tubes: ETT 1/09 >> 1/19 Lt IJ CVL 1/10 >>  Events: 1/08 Admit 1/09 VDRF 1/10 Found to have CVA  Summary: 66 yo male with VDRF from LV MCA CVA, HHNK, PNA.  Assessment/plan:  Acute respiratory failure with compromised airway and poor neuro status - s/p one-way extubation 1/19 Hx of COPD, OSA. Acute metabolic encephalopathy. Lt MCA CVA. Hx of seizures. Micrococcus bacteremia. DM with HHNK. AKI with hx of CKD 3.  PLAN -  Full comfort care  No further labs, xrays, abx  Continue morphine gtt and titrate for comfort   Will move to palliative floor and ask FPTS to resume care 1/21  No family at bedside 1/20.    Dirk Dress, NP 07/22/2016  12:07 PM Pager: [phone] or [phone]

## 2016-07-22 NOTE — Progress Notes (Signed)
07/22/2016 1300 Ronald GoldsmithKatie Whiteheart NP at bedside with verbal order to increase morphine gtt to 12 mg/hr now due to increased respiratory effort and increase up to 14 mg/hr as needed for patient comfort. Orders enacted. Will continue to closely monitor patient.  Kimbella Heisler, Blanchard KelchStephanie Ingold

## 2016-07-22 NOTE — Progress Notes (Signed)
07/22/2016 1555 Pt. Transferred to [id number]. RN at bedside to receive patient. Questions and concerns addressed. Family updated on patient transfer.  Sennie Borden, Blanchard KelchStephanie Ingold

## 2016-07-23 DIAGNOSIS — Z515 Encounter for palliative care: Secondary | ICD-10-CM

## 2016-07-23 MED ORDER — WHITE PETROLATUM GEL: 1.0000 "application " | Status: DC | PRN

## 2016-07-23 MED ORDER — GLYCOPYRROLATE 0.2 MG/ML IJ SOLN
0.1000 mg | INTRAMUSCULAR | Status: DC | PRN
  Administered 2016-07-24: 0.1 mg via INTRAVENOUS
  Filled 2016-07-23: qty 1

## 2016-07-23 MED ORDER — CHLORHEXIDINE GLUCONATE 0.12 % MT SOLN
15.0000 mL | Freq: Two times a day (BID) | OROMUCOSAL | Status: DC
  Administered 2016-07-23 – 2016-07-24 (×2): 15 mL via OROMUCOSAL
  Filled 2016-07-23: qty 15

## 2016-07-23 NOTE — Clinical Social Work Note (Addendum)
CSW consulted for hospice placement. CSW will begin search for hospice bed.   10:30: CSW spoke with pt's spouse, pt's spouse did not know pt would be d/c to hospice home. Pt was understanding that pt was comfort care. Pt is agreeable to a hospice home placement at this time. [facility].   12:12: [facility] today however, anticipated bed open tomorrow. [facility] is accepting the referral and will process it.  [address]Bridget Mayton, ConnecticutLCSWA [phone]

## 2016-07-23 NOTE — Progress Notes (Signed)
[facility]  Received request from HobackBridget, CSW for family interest [facility] with [facility] possibly [facility].  Chart reviewed.  Spoke with Barron AlvineBurnannette Weinhold, patient's wife, over the phone and she advised that she would be able to meet with Carley HammedEva tomorrow at 1000am.  Family was not with the patient this afternoon.    There is no bed available today, will continue to monitor bed status at [facility] for tomorrow.  Thank you,  Adele BarthelAmy Evans, RN, BSN [facility]iaison [phone]   All hospital liaison's are now on AMION.   Please feel free to contact me directly or call hospice at [phone] after 5pm.

## 2016-07-23 NOTE — Progress Notes (Signed)
Family Medicine Teaching Service Daily Progress Note Intern Pager: [phone]  Patient name: Ronald FordLouis Koontz Jr. Medical record number: [id number] Date of birth: 04-16-1951 Age: 66 y.o. Gender: male  Primary Care Provider: Caryl AdaJazma Nyeli Holtmeyer, DO Consultants: None Code Status: DNR/DNI   Assessment and Plan: 66 yo male presented 1/08 with altered mental status from HHNK, PNA, hypothermia, and CVA. He had progressive respiratory distress and eventually needed intubation. PMHx of COPD, DM, hepatitis, HTN, OSA, Seizures, CM.   #Terminal comfort care: Was terminally extubated on 1/19. Patient withDNR. Focus on comfort. -f/u CSW for bed placement at hospice facility -comfort care measures -no further labs, imaging, or medications -continue morphine gtt  Disposition: Awaiting placement  Subjective:  Remains unresponsive and with noisy breathing. Vitals remain stable.   Objective: Temp:  [99.8 F (37.7 C)-100.3 F (37.9 C)] 100.3 F (37.9 C) (01/21 0521) Pulse Rate:  [93-108] 108 (01/21 0521) Resp:  [10-15] 10 (01/21 0521) BP: (131-134)/(66) 134/66 (01/21 0521) SpO2:  [89 %-93 %] 93 % (01/21 0521) Weight:  [270 lb 1.6 oz (122.5 kg)] 270 lb 1.6 oz (122.5 kg) (01/21 0521) Physical Exam: General: comfortable on morphine  Neuro: obtunded, does not follow commands  ENT: mm moist  Cardiac: RRR Chest: resps even, non labored, noisy breathing, minimal secretions  Abd: soft, non tender Ext: edematous Skin: no rashes  Laboratory:  Recent Labs Lab 07/19/16 0540 07/20/16 0500 07/21/16 0425  WBC 11.3* 12.6* 13.4*  HGB 10.0* 10.9* 10.6*  HCT 30.8* 33.8* 33.0*  PLT 288 319 321    Recent Labs Lab 07/17/16 0451  07/20/16 1500 07/20/16 2300 07/21/16 0425  NA 163*  < > 151* 150* 153*  K 4.0  < > 3.4* 3.7 3.6  CL >130*  < > 120* 118* 118*  CO2 22  < > 24 23 24   BUN 41*  < > 47* 45* 46*  CREATININE 1.98*  < > 2.31* 2.21* 2.25*  CALCIUM 8.9  < > 8.5* 8.6* 8.7*  PROT 5.7*  --   --   --    --   BILITOT 0.3  --   --   --   --   ALKPHOS 53  --   --   --   --   ALT 20  --   --   --   --   AST 31  --   --   --   --   GLUCOSE 136*  < > 235* 173* 143*  < > = values in this interval not displayed.    Imaging/Diagnostic Tests: Studies: CT head 1/08 >> atrophy, small vessel disease MRI brain 1/08 >> large Lt MCA CVA with edema EEG 1/10 >> no seizures  Pincus LargeJazma Y Dreon Pineda, DO 07/23/2016, 8:26 AM PGY-3, [place] [facility] FPTS Intern pager: [phone], text pages welcome

## 2016-07-24 MED ORDER — HEPARIN SOD (PORK) LOCK FLUSH 10 UNIT/ML IV SOLN
30.0000 [IU] | INTRAVENOUS | Status: DC | PRN
  Administered 2016-07-24: 30 [IU]

## 2016-07-24 MED ORDER — ACETAMINOPHEN 650 MG RE SUPP
650.0000 mg | Freq: Three times a day (TID) | RECTAL | Status: DC | PRN
  Administered 2016-07-24: 650 mg via RECTAL
  Filled 2016-07-24: qty 1

## 2016-07-24 NOTE — Progress Notes (Signed)
Wasted 190cc of Morphine in sink.  Pt discharged.  Witnessed by Inez PilgrimBen Bhunu, RN.

## 2016-07-24 NOTE — Progress Notes (Signed)
Pt had temp 102 informed the Family teaching service, rendered sponge bath, waiting for new orders.

## 2016-07-24 NOTE — Clinical Social Work Note (Signed)
Pt is ready for [facility] today [facility]. Pt's family in is agreement and have completed paperwork. CSW signing off as no further needs identified.   Dede QuerySarah Hershell Brandl, MSW, LCSW  Clincial Social Worker [phone]

## 2016-07-24 NOTE — Consult Note (Signed)
[facility] room available for [name] today. Met with his spouse [name] to complete paper work for transfer today. Dr. Orpah Melter to assume care per family request. CSW Sarah aware.   Please fax discharge summary to [phone].  RN please call report to [phone].  Have requested CSW arrange for 3 PM pickup.   Thank you,  Erling Conte, LCSW [phone]  [facility] hospital liaisons are listed on [facility] under [facility] [place]. Please contact liaisons directly or call [phone].

## 2016-07-24 NOTE — Progress Notes (Signed)
[facility]  Pt unresponsive.  Pt on 2L O2 via [place].  Pt has TLC to Lt IJ hep locked.  Pt has foley in place draining clear yellow urine and rectal pouch in place.  Foam to sacrum and blister to buttocks.  Report called and given to [facility]k.

## 2016-07-24 NOTE — Progress Notes (Addendum)
Wasted 25cc of Morphine in sink.  New bag of Morphine hung.  Witnessed by Inez PilgrimBen Bhunu, RN.

## 2016-07-24 NOTE — Progress Notes (Signed)
Nutrition Brief Note  Chart reviewed. Pt now transitioning to comfort care.  No further nutrition interventions warranted at this time.  Please re-consult as needed.   Joandry Slagter A. Avya Flavell, RD, LDN, CDE Pager: [phone] After hours Pager: [phone]  

## 2016-07-24 NOTE — Progress Notes (Signed)
Family Medicine Teaching Service Daily Progress Note Intern Pager: [phone]  Patient name: Ronald FordLouis Albor Jr. Medical record number: [id number] Date of birth: 03-07-1951 Age: 66 y.o. Gender: male  Primary Care Provider: Caryl AdaJazma Phelps, DO Consultants: None Code Status: DNR/DNI   Assessment and Plan: 66 yo male presented 1/08 with altered mental status from HHNK, PNA, hypothermia, and CVA. He had progressive respiratory distress and eventually needed intubation. PMHx of COPD, DM, hepatitis, HTN, OSA, Seizures, CM.   #Terminal comfort care: Was terminally extubated on 1/19. Patient with DNR. Focus on comfort. -f/u CSW for bed placement at hospice facility -comfort care measures -no further labs, imaging, or medications -continue morphine gtt -tylenol rectally prn for fever, T 102.9 this am  Disposition: Awaiting hospice placement, possible [facility] today   Subjective:  Remains unresponsive and with noisy breathing. Vitals remain stable.   Objective: Temp:  [102.9 F (39.4 C)] 102.9 F (39.4 C) (01/22 0532) Pulse Rate:  [122] 122 (01/22 0532) BP: (149)/(71) 149/71 (01/22 0532) SpO2:  [94 %] 94 % (01/22 0532) Physical Exam: General: comfortable on morphine  Neuro: obtunded, does not follow commands  ENT: mm moist  Cardiac: RRR Chest: resps even, non labored, noisy breathing, minimal secretions  Abd: soft, non tender Ext: edematous Skin: no rashes  Laboratory:  Recent Labs Lab 07/19/16 0540 07/20/16 0500 07/21/16 0425  WBC 11.3* 12.6* 13.4*  HGB 10.0* 10.9* 10.6*  HCT 30.8* 33.8* 33.0*  PLT 288 319 321    Recent Labs Lab 07/20/16 1500 07/20/16 2300 07/21/16 0425  NA 151* 150* 153*  K 3.4* 3.7 3.6  CL 120* 118* 118*  CO2 24 23 24   BUN 47* 45* 46*  CREATININE 2.31* 2.21* 2.25*  CALCIUM 8.5* 8.6* 8.7*  GLUCOSE 235* 173* 143*      Imaging/Diagnostic Tests: Studies: CT head 1/08 >> atrophy, small vessel disease MRI brain 1/08 >> large Lt MCA CVA with  edema EEG 1/10 >> no seizures  Leland HerElsia J Chanley Mcenery, DO 10/13/2016, 6:48 AM PGY-1, [place] [facility] FPTS Intern pager: [phone], text pages welcome

## 2016-08-03 DEATH — deceased

## 2018-02-21 IMAGING — CR DG CHEST 1V PORT
1 series · 1 of 1 positions shown · non-contrast
Comparison: Chest radiograph performed earlier today at [DATE] p.m.

CLINICAL DATA: Acute onset of respiratory failure. Initial
encounter.

EXAM:
PORTABLE CHEST 1 VIEW

[AP]
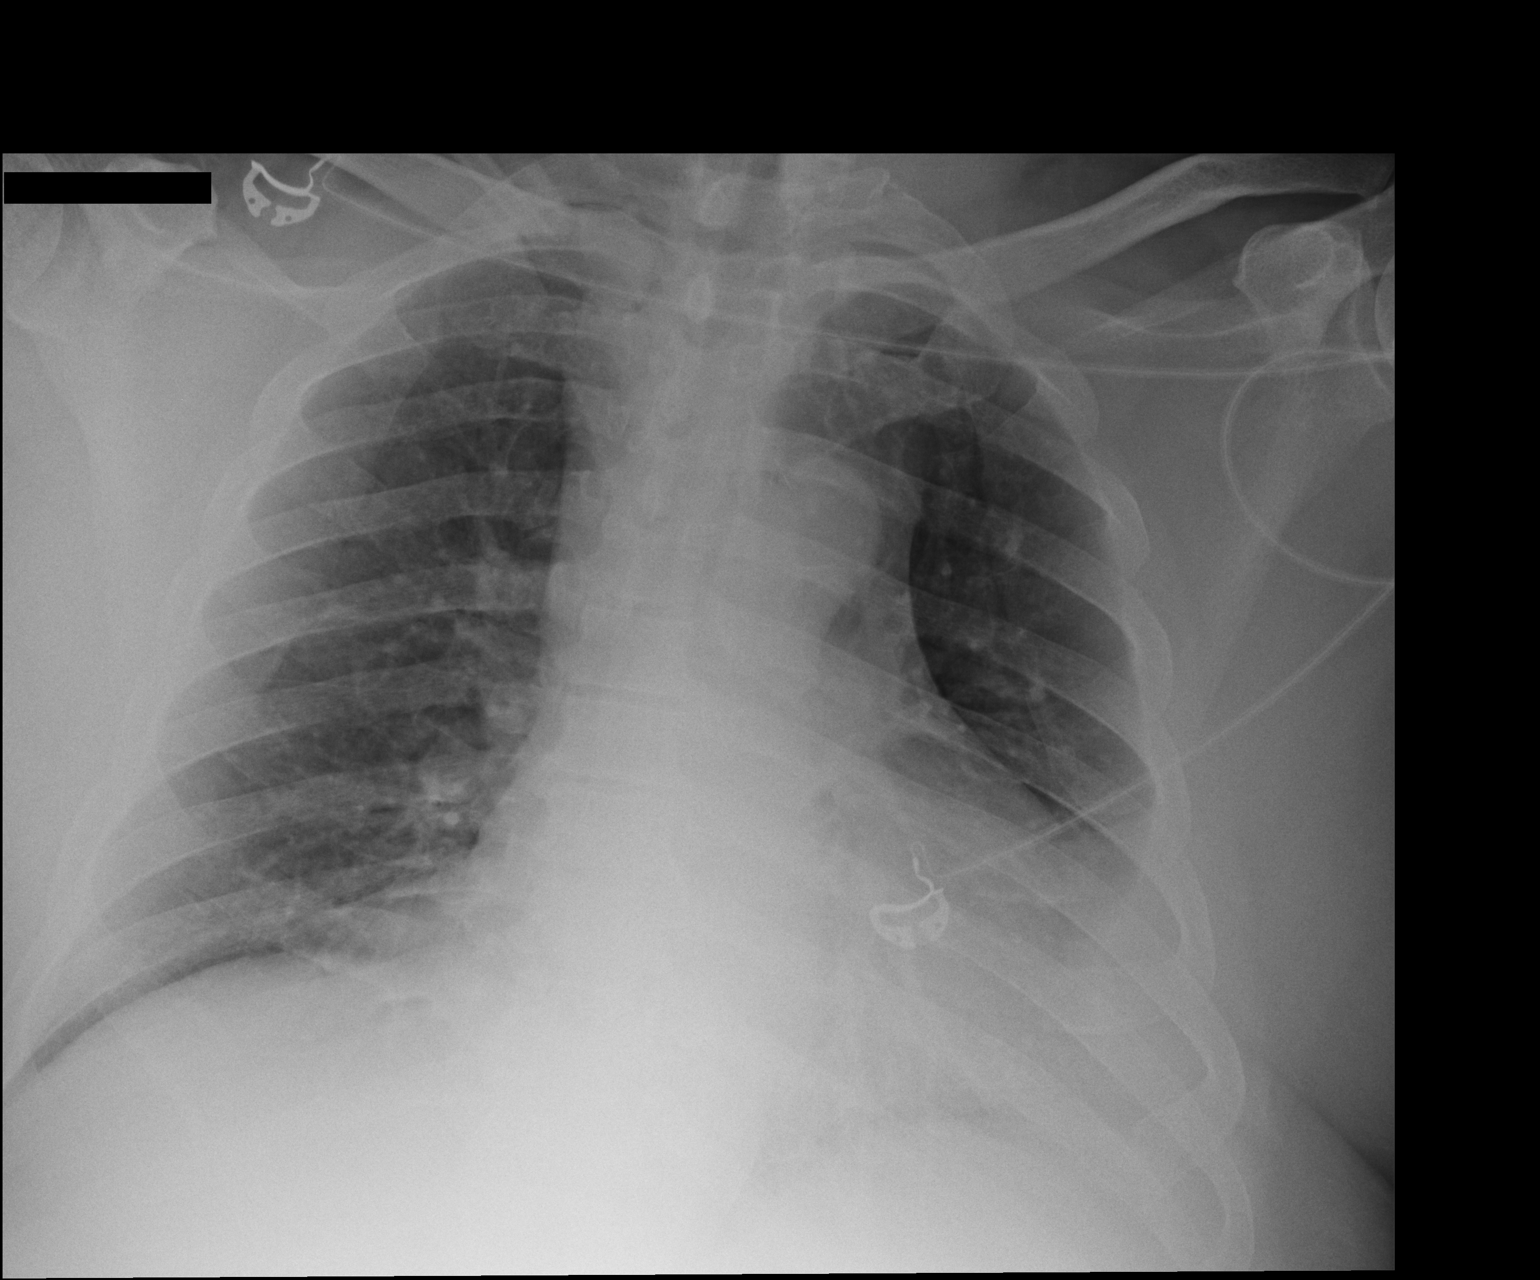

[1 of 1 positions shown; findings below may reference images not displayed]

FINDINGS: A small left pleural effusion is noted, more prominent than on the
prior study. Left basilar airspace opacity may reflect pneumonia. No
pneumothorax is seen.

The cardiomediastinal silhouette is mildly enlarged. No acute
osseous abnormalities are identified.
IMPRESSION: Small left pleural effusion, more prominent than on the prior study.
Left basilar airspace opacity may reflect pneumonia. Mild
cardiomegaly.

## 2018-02-22 IMAGING — MR MR HEAD W/O CM
9 of 11 series · 31 of 48 positions shown · non-contrast
Comparison: Head CT without contrast 07/10/2016. Brain MRI and MRA
12/30/2011.

ADDENDUM:
Study discussed by telephone with Dr. CHING YUEN KELANA on 07/12/2016 at
4311 hours.
CLINICAL DATA: 65-year-old male with 1 week of altered mental
status. Found inside his car wearing little clothing when
temperatures were below freezing. Hyperglycemia. Recent fever.
Initial encounter.

EXAM:
MRI HEAD WITHOUT CONTRAST
MRA HEAD WITHOUT CONTRAST
TECHNIQUE: Multiplanar, multiecho pulse sequences of the brain and surrounding
structures were obtained without intravenous contrast. Angiographic
images of the head were obtained using MRA technique without
contrast.

[Series 3: DWI · axial · 3.0mm · 0.94mm/px · z∈[-27,+104]mm · 6 of 91 slices shown (1 of 2)]
[im 1/91]
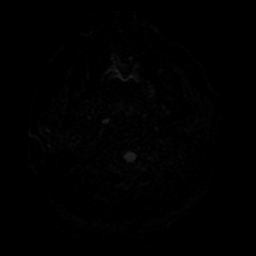
[im 19/91]
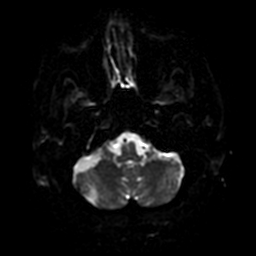
[im 37/91]
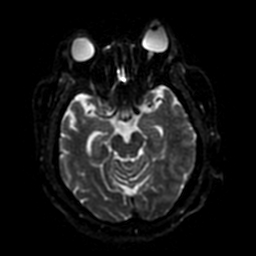
[im 55/91]
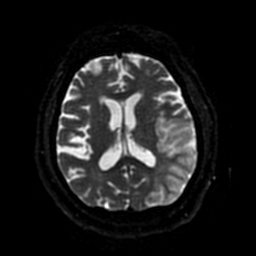
[im 73/91]
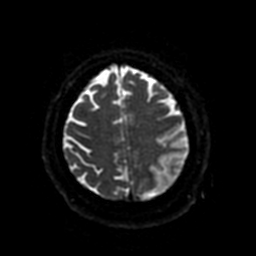
[im 91/91]
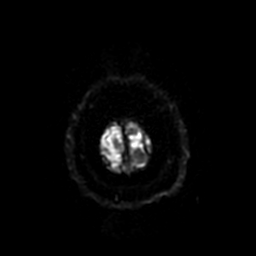

[Series 4: DWI · coronal · 4.0mm · 0.94mm/px · 5 of 64 slices shown (2 of 2)]
[im 1/64]
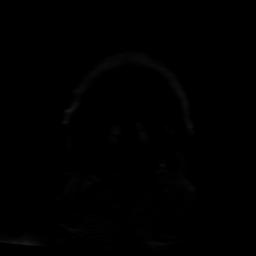
[im 16/64]
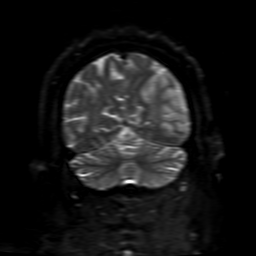
[im 32/64]
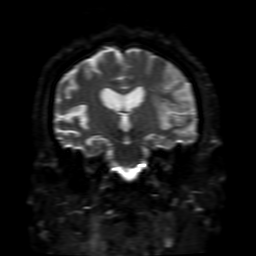
[im 48/64]
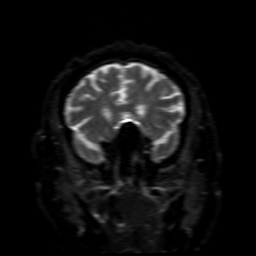
[im 64/64]
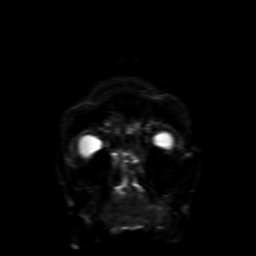

[Series 5: (person_name) · axial · 3.0mm · 0.47mm/px · z∈[-27,+106]mm · 7 of 92 slices shown]
[im 1/92]
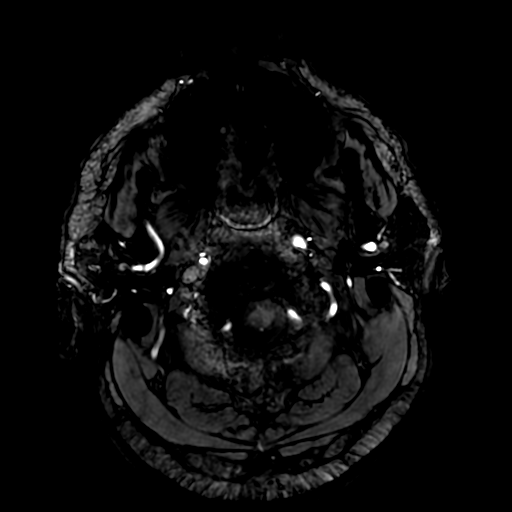
[im 16/92]
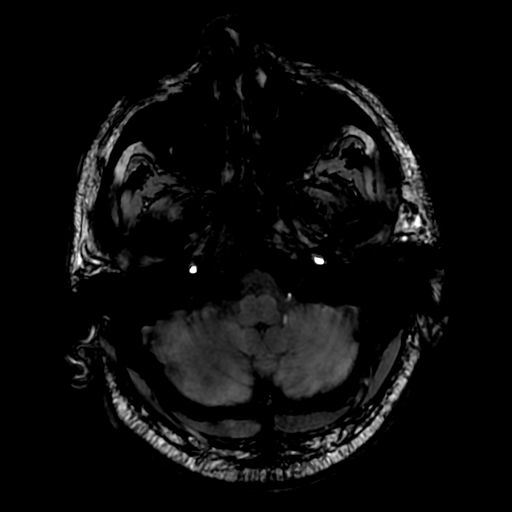
[im 31/92]
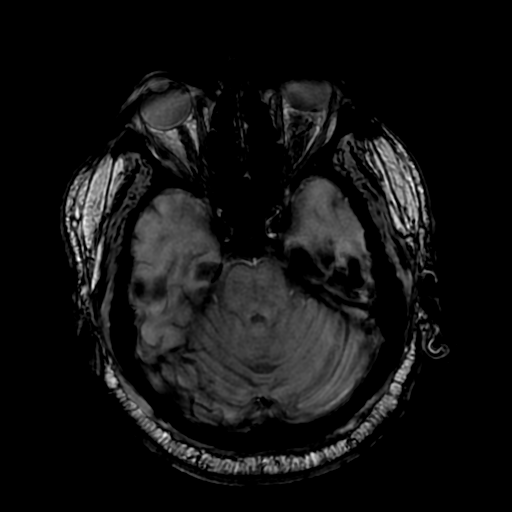
[im 46/92]
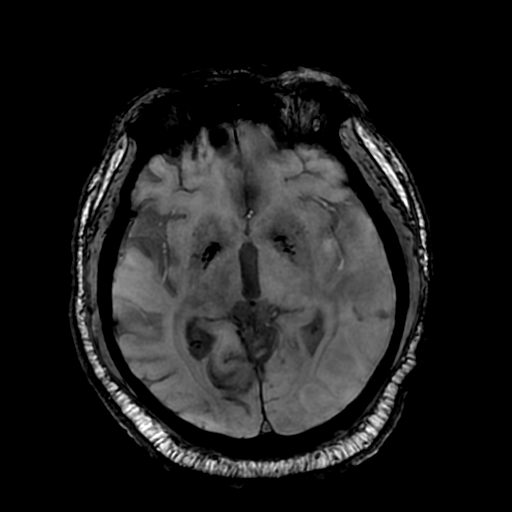
[im 61/92]
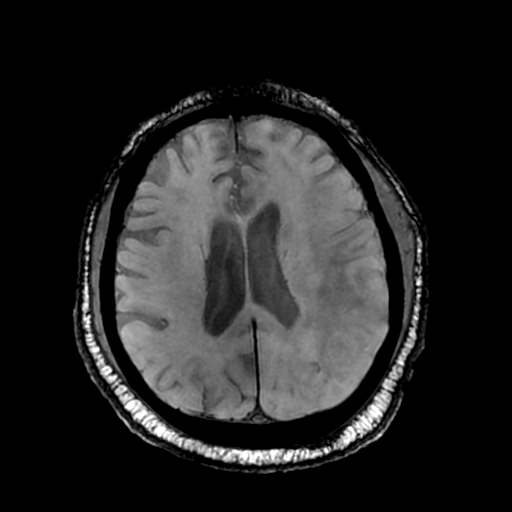
[im 76/92]
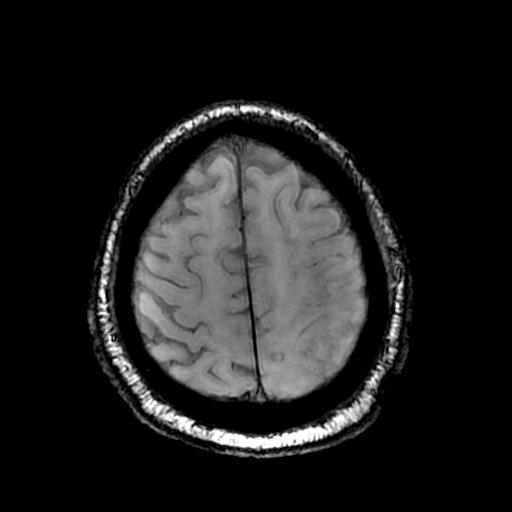
[im 92/92]
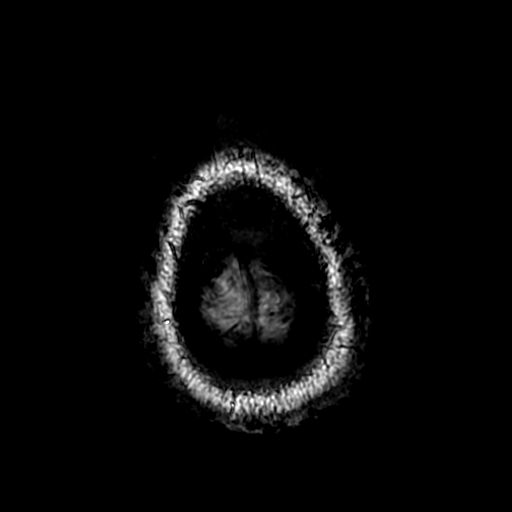

[Series 6: T2 · axial · 5.0mm · 0.47mm/px · z∈[-25,+103]mm · 2 of 23 slices shown (1 of 2)]
[im 1/23]
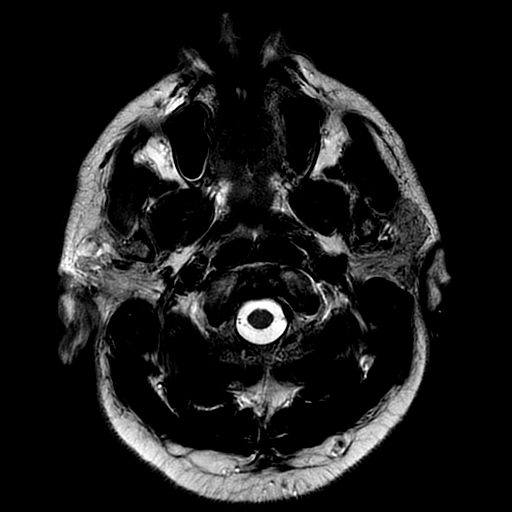
[im 23/23]
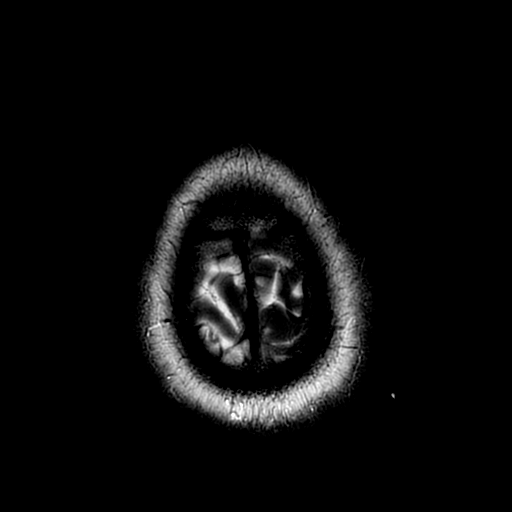

[Series 7: FLAIR · axial · 5.0mm · 0.94mm/px · z∈[-25,+103]mm · 2 of 23 slices shown (1 of 2)]
[im 1/23]
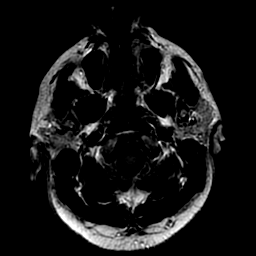
[im 23/23]
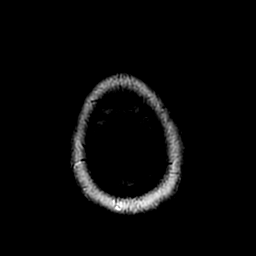

[Series 10: FLAIR · sagittal · 5.0mm · 0.51mm/px · 2 of 23 slices shown (2 of 2)]
[im 1/23]
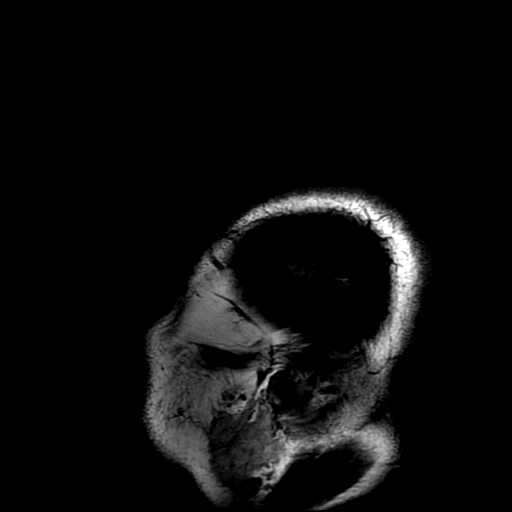
[im 23/23]
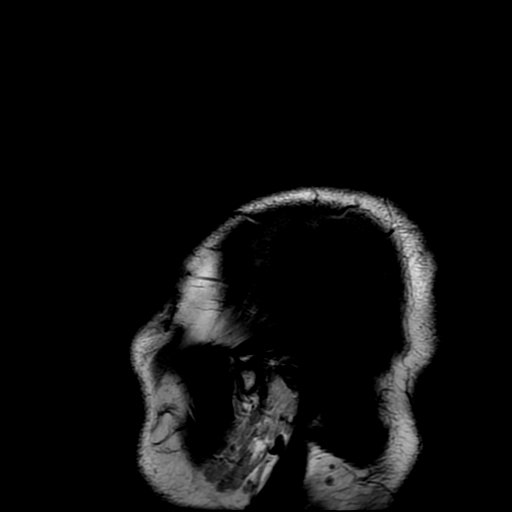

[Series 12: T2 · coronal · 5.0mm · 0.94mm/px · 2 of 27 slices shown (2 of 2)]
[im 1/27]
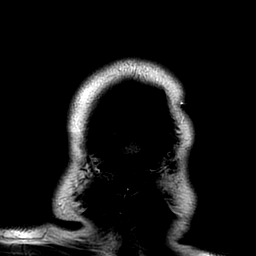
[im 27/27]
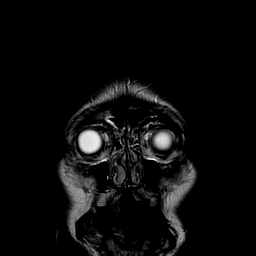

[Series 350: ADC · axial · 3.0mm · 0.94mm/px · z∈[-27,+104]mm · 3 of 46 slices shown (1 of 2)]
[im 1/46]
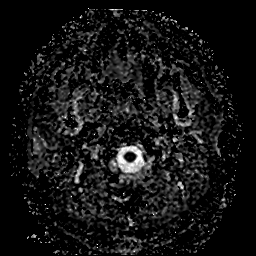
[im 23/46]
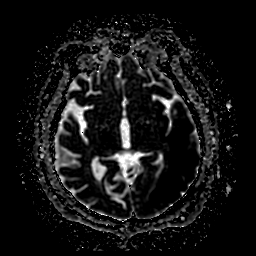
[im 46/46]
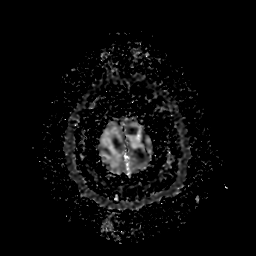

[Series 450: ADC · coronal · 4.0mm · 0.94mm/px · 2 of 32 slices shown (2 of 2)]
[im 1/32]
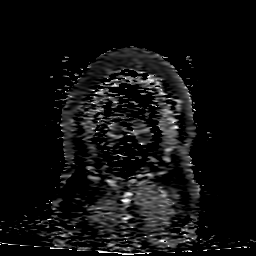
[im 32/32]
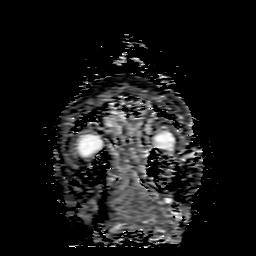

[31 of 48 positions shown; findings below may reference images not displayed]

FINDINGS: MRI HEAD FINDINGS

Brain: Confluent restricted diffusion throughout the posterior left
MCA territory a beginning at the mid insula and involving the
operculum, posterior left frontal lobe and left parietal lobe.
Cytotoxic edema with gyral swelling throughout the affected area.
Trace petechial hemorrhage in the posterior left temporal lobe. No
malignant hemorrhagic transformation. No midline shift or
significant intracranial mass effect at this time.

No other restricted diffusion, no involvement of other vascular
territories. No ventriculomegaly. Negative pituitary and
cervicomedullary junction. Outside of the left MCA territory stable
gray and white matter signal since 8977. No cortical
encephalomalacia or chronic cerebral blood products.

Vascular: Major intracranial vascular flow voids are stable compared
to 8977. See below.

Skull and upper cervical spine: Negative. Normal bone marrow signal.

Sinuses/Orbits: Negative orbits soft tissues. Trace paranasal sinus
mucosal thickening is stable.

Other: Small volume retained secretions in the nasopharynx. Trace
mastoid fluid. Negative scalp soft tissues.

MRA HEAD FINDINGS

Superior quality of today MRA versus the 8977 comparison.

Diminutive and irregular distal vertebral arteries with antegrade
flow mostly on the left. Diminutive and irregular proximal [DATE] of
the basilar artery. Diminutive but more normal appearing distal
basilar artery with patent SCA origins. Normal right PCA origin.
Fetal type left PCA origin. Bilateral PCA branches are within normal
limits. The right posterior communicating artery is diminutive or
absent.

Stable antegrade flow in both ICA siphons. Mild bilateral siphon
irregularity. No left siphon stenosis. Mild chronic right
supraclinoid siphon stenosis is stable since 8977. The left siphon
is dominant owing to the PCA and ACA anatomy. Normal MCA and left
ACA origins. Diminutive or absent right A1 segment. Anterior
communicating artery and visualized ACA branches are within normal
limits. Right MCA M1 segment and bifurcation are patent. There is
mild to moderate irregularity and stenosis at the right MCA
bifurcation, but no right MCA branch occlusion is identified.

The left MCA M1 segment is patent with mild irregularity and no
stenosis. The left MCA bifurcation is patent without stenosis. No
left MCA branch occlusion is identified.
IMPRESSION: 1. Large posterior left MCA territory infarct with cytotoxic edema
and trace petechial hemorrhage but no intracranial mass effect at
this time.
2. No associated large vessel or left MCA branch occlusion.
3. Intracranial MRA is positive for moderate to severe posterior
circulation atherosclerosis and stenosis (involving both distal
vertebral arteries and the proximal basilar artery), with mild to
moderate atherosclerosis and stenosis in the right anterior
circulation (right supraclinoid ICA and right MCA bifurcation).

## 2018-02-22 IMAGING — CR DG CHEST 1V PORT
1 series · 1 of 1 positions shown · non-contrast
Comparison: Chest radiograph 07/11/2016

CLINICAL DATA: Intubation

EXAM:
PORTABLE CHEST 1 VIEW

[AP]
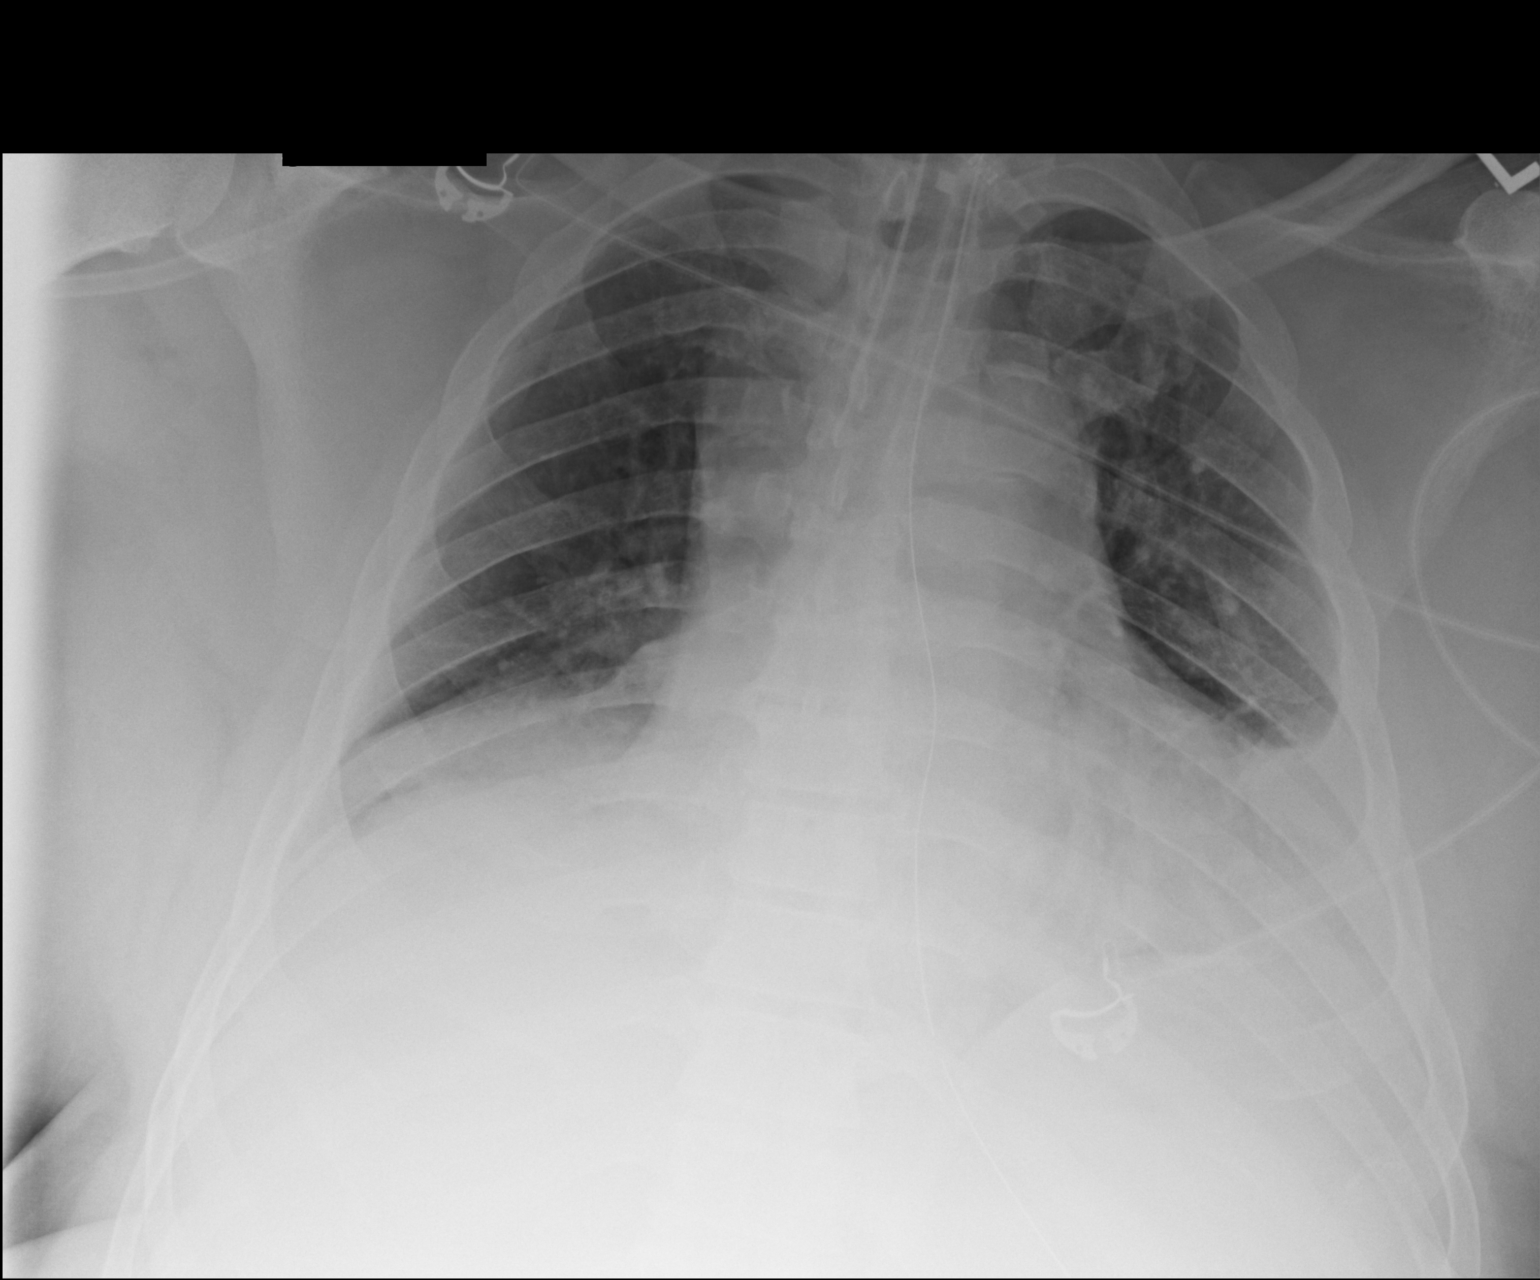

[1 of 1 positions shown; findings below may reference images not displayed]

FINDINGS: The patient has been intubated. The tip of the endotracheal tube is
2.6 cm above the inferior margin of the carina. Small left pleural
effusion is unchanged. Unchanged cardiomegaly. Enteric tube side
port is approximately the level of the gastroesophageal junction.
IMPRESSION: 1. Endotracheal tube tip 2.6 cm above the carina. This could be
retracted 3.5 cm for optimal positioning.
2. Enteric tube side port of the level of the gastroesophageal
junction. Recommend advancement by 5-7 cm.
